# Patient Record
Sex: Male | Born: 1940
Health system: Southern US, Community
[De-identification: ages and names within clinical notes are randomized; demographics above are authoritative.]

## PROBLEM LIST (undated history)

## (undated) DIAGNOSIS — E785 Hyperlipidemia, unspecified: Secondary | ICD-10-CM

## (undated) DIAGNOSIS — Z87442 Personal history of urinary calculi: Secondary | ICD-10-CM

## (undated) DIAGNOSIS — N433 Hydrocele, unspecified: Secondary | ICD-10-CM

## (undated) DIAGNOSIS — J45909 Unspecified asthma, uncomplicated: Secondary | ICD-10-CM

## (undated) DIAGNOSIS — T7840XA Allergy, unspecified, initial encounter: Secondary | ICD-10-CM

## (undated) DIAGNOSIS — M1990 Unspecified osteoarthritis, unspecified site: Secondary | ICD-10-CM

## (undated) DIAGNOSIS — Z85828 Personal history of other malignant neoplasm of skin: Secondary | ICD-10-CM

## (undated) DIAGNOSIS — M5137 Other intervertebral disc degeneration, lumbosacral region: Secondary | ICD-10-CM

## (undated) DIAGNOSIS — Z86010 Personal history of colonic polyps: Secondary | ICD-10-CM

## (undated) DIAGNOSIS — H2101 Hyphema, right eye: Secondary | ICD-10-CM

## (undated) DIAGNOSIS — C4492 Squamous cell carcinoma of skin, unspecified: Secondary | ICD-10-CM

## (undated) DIAGNOSIS — G5760 Lesion of plantar nerve, unspecified lower limb: Secondary | ICD-10-CM

## (undated) DIAGNOSIS — H8110 Benign paroxysmal vertigo, unspecified ear: Secondary | ICD-10-CM

## (undated) DIAGNOSIS — K5730 Diverticulosis of large intestine without perforation or abscess without bleeding: Secondary | ICD-10-CM

## (undated) HISTORY — PX: BACK SURGERY: SHX140

## (undated) HISTORY — PX: SPINE SURGERY: SHX786

## (undated) HISTORY — DX: Allergy, unspecified, initial encounter: T78.40XA

## (undated) HISTORY — DX: Other intervertebral disc degeneration, lumbosacral region: M51.37

## (undated) HISTORY — PX: MOHS SURGERY: SUR867

## (undated) HISTORY — DX: Unspecified asthma, uncomplicated: J45.909

## (undated) HISTORY — DX: Hyperlipidemia, unspecified: E78.5

## (undated) HISTORY — DX: Personal history of colonic polyps: Z86.010

## (undated) HISTORY — DX: Benign paroxysmal vertigo, unspecified ear: H81.10

## (undated) HISTORY — PX: EYE SURGERY: SHX253

## (undated) HISTORY — DX: Hydrocele, unspecified: N43.3

## (undated) HISTORY — DX: Personal history of other malignant neoplasm of skin: Z85.828

## (undated) HISTORY — DX: Lesion of plantar nerve, unspecified lower limb: G57.60

## (undated) HISTORY — DX: Hyphema, right eye: H21.01

## (undated) HISTORY — DX: Unspecified osteoarthritis, unspecified site: M19.90

## (undated) HISTORY — DX: Diverticulosis of large intestine without perforation or abscess without bleeding: K57.30

## (undated) HISTORY — PX: COLONOSCOPY W/ BIOPSIES: SHX1374

---
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
## 2001-02-22 ENCOUNTER — Encounter: Admission: RE | Admit: 2001-02-22 | Discharge: 2001-03-25 | Payer: Self-pay | Admitting: Family Medicine

## 2002-01-12 ENCOUNTER — Emergency Department (HOSPITAL_COMMUNITY): Admission: EM | Admit: 2002-01-12 | Discharge: 2002-01-12 | Payer: Self-pay

## 2002-01-13 ENCOUNTER — Inpatient Hospital Stay (HOSPITAL_COMMUNITY): Admission: EM | Admit: 2002-01-13 | Discharge: 2002-01-16 | Payer: Self-pay | Admitting: Family Medicine

## 2002-01-13 ENCOUNTER — Encounter: Payer: Self-pay | Admitting: Family Medicine

## 2004-06-17 ENCOUNTER — Ambulatory Visit: Payer: Self-pay | Admitting: Family Medicine

## 2004-06-24 ENCOUNTER — Ambulatory Visit: Payer: Self-pay | Admitting: Family Medicine

## 2004-12-23 ENCOUNTER — Ambulatory Visit: Payer: Self-pay | Admitting: Family Medicine

## 2004-12-31 ENCOUNTER — Ambulatory Visit: Payer: Self-pay | Admitting: Family Medicine

## 2005-04-29 ENCOUNTER — Ambulatory Visit: Payer: Self-pay | Admitting: Family Medicine

## 2005-06-17 ENCOUNTER — Ambulatory Visit: Payer: Self-pay | Admitting: Family Medicine

## 2005-07-07 ENCOUNTER — Ambulatory Visit: Payer: Self-pay | Admitting: Family Medicine

## 2005-07-30 ENCOUNTER — Encounter: Admission: RE | Admit: 2005-07-30 | Discharge: 2005-07-30 | Payer: Self-pay | Admitting: Family Medicine

## 2006-07-24 ENCOUNTER — Ambulatory Visit: Payer: Self-pay | Admitting: Family Medicine

## 2006-07-24 LAB — CONVERTED CEMR LAB
ALT: 31 units/L (ref 0–40)
AST: 26 units/L (ref 0–37)
Albumin: 3.9 g/dL (ref 3.5–5.2)
Alkaline Phosphatase: 47 units/L (ref 39–117)
BUN: 18 mg/dL (ref 6–23)
Basophils Absolute: 0.1 10*3/uL (ref 0.0–0.1)
Basophils Relative: 0.9 % (ref 0.0–1.0)
CO2: 28 meq/L (ref 19–32)
Calcium: 9.3 mg/dL (ref 8.4–10.5)
Chloride: 105 meq/L (ref 96–112)
Chol/HDL Ratio, serum: 4
Cholesterol: 161 mg/dL (ref 0–200)
Creatinine, Ser: 1 mg/dL (ref 0.4–1.5)
Eosinophil percent: 1.9 % (ref 0.0–5.0)
GFR calc non Af Amer: 79 mL/min
Glomerular Filtration Rate, Af Am: 96 mL/min/{1.73_m2}
Glucose, Bld: 95 mg/dL (ref 70–99)
HCT: 42.7 % (ref 39.0–52.0)
HDL: 40.5 mg/dL (ref >39.0)
Hemoglobin: 14.9 g/dL (ref 13.0–17.0)
LDL Cholesterol: 102 mg/dL — ABNORMAL HIGH (ref 0–99)
Lymphocytes Relative: 33.6 % (ref 12.0–46.0)
MCHC: 34.9 g/dL (ref 30.0–36.0)
MCV: 94.2 fL (ref 78.0–100.0)
Monocytes Absolute: 0.4 10*3/uL (ref 0.2–0.7)
Monocytes Relative: 7.2 % (ref 3.0–11.0)
Neutro Abs: 3.5 10*3/uL (ref 1.4–7.7)
Neutrophils Relative %: 56.4 % (ref 43.0–77.0)
PSA: 3.51 ng/mL (ref 0.10–4.00)
Platelets: 192 10*3/uL (ref 150–400)
Potassium: 4.7 meq/L (ref 3.5–5.1)
RBC: 4.53 M/uL (ref 4.22–5.81)
RDW: 11.8 % (ref 11.5–14.6)
Sodium: 140 meq/L (ref 135–145)
TSH: 1.85 microintl units/mL (ref 0.35–5.50)
Total Bilirubin: 1 mg/dL (ref 0.3–1.2)
Total Protein: 7.1 g/dL (ref 6.0–8.3)
Triglyceride fasting, serum: 94 mg/dL (ref 0–149)
VLDL: 19 mg/dL (ref 0–40)
WBC: 6.1 10*3/uL (ref 4.5–10.5)

## 2006-08-06 ENCOUNTER — Ambulatory Visit: Payer: Self-pay | Admitting: Family Medicine

## 2007-03-12 DIAGNOSIS — E1169 Type 2 diabetes mellitus with other specified complication: Secondary | ICD-10-CM | POA: Insufficient documentation

## 2007-03-12 DIAGNOSIS — Z860100 Personal history of colon polyps, unspecified: Secondary | ICD-10-CM | POA: Insufficient documentation

## 2007-03-12 DIAGNOSIS — E785 Hyperlipidemia, unspecified: Secondary | ICD-10-CM

## 2007-03-12 HISTORY — DX: Personal history of colon polyps, unspecified: Z86.0100

## 2007-03-12 HISTORY — DX: Hyperlipidemia, unspecified: E78.5

## 2007-03-12 HISTORY — DX: Personal history of colonic polyps: Z86.010

## 2007-04-28 ENCOUNTER — Ambulatory Visit: Payer: Self-pay | Admitting: Family Medicine

## 2007-04-28 DIAGNOSIS — H8110 Benign paroxysmal vertigo, unspecified ear: Secondary | ICD-10-CM

## 2007-04-28 HISTORY — DX: Benign paroxysmal vertigo, unspecified ear: H81.10

## 2007-07-13 ENCOUNTER — Ambulatory Visit: Payer: Self-pay | Admitting: Internal Medicine

## 2007-07-13 LAB — CONVERTED CEMR LAB
ALT: 30 units/L (ref 0–53)
AST: 27 units/L (ref 0–37)
Albumin: 4.2 g/dL (ref 3.5–5.2)
Alkaline Phosphatase: 43 units/L (ref 39–117)
BUN: 11 mg/dL (ref 6–23)
Basophils Absolute: 0 10*3/uL (ref 0.0–0.1)
Basophils Relative: 0 % (ref 0.0–1.0)
Bilirubin, Direct: 0.2 mg/dL (ref 0.0–0.3)
CO2: 32 meq/L (ref 19–32)
Calcium: 9.4 mg/dL (ref 8.4–10.5)
Chloride: 101 meq/L (ref 96–112)
Cholesterol: 186 mg/dL (ref 0–200)
Creatinine, Ser: 1 mg/dL (ref 0.4–1.5)
Eosinophils Absolute: 0.1 10*3/uL (ref 0.0–0.6)
Eosinophils Relative: 1.7 % (ref 0.0–5.0)
GFR calc Af Amer: 96 mL/min
GFR calc non Af Amer: 79 mL/min
Glucose, Bld: 86 mg/dL (ref 70–99)
HCT: 43.8 % (ref 39.0–52.0)
HDL: 41.2 mg/dL (ref >39.0)
Hemoglobin: 15.1 g/dL (ref 13.0–17.0)
LDL Cholesterol: 113 mg/dL — ABNORMAL HIGH (ref 0–99)
Lymphocytes Relative: 32.2 % (ref 12.0–46.0)
MCHC: 34.4 g/dL (ref 30.0–36.0)
MCV: 94.6 fL (ref 78.0–100.0)
Monocytes Absolute: 0.6 10*3/uL (ref 0.2–0.7)
Monocytes Relative: 7.3 % (ref 3.0–11.0)
Neutro Abs: 4.5 10*3/uL (ref 1.4–7.7)
Neutrophils Relative %: 58.8 % (ref 43.0–77.0)
PSA: 3.27 ng/mL (ref 0.10–4.00)
Platelets: 199 10*3/uL (ref 150–400)
Potassium: 4.6 meq/L (ref 3.5–5.1)
RBC: 4.63 M/uL (ref 4.22–5.81)
RDW: 12 % (ref 11.5–14.6)
Sodium: 141 meq/L (ref 135–145)
TSH: 2.22 microintl units/mL (ref 0.35–5.50)
Total Bilirubin: 1.1 mg/dL (ref 0.3–1.2)
Total CHOL/HDL Ratio: 4.5
Total Protein: 6.8 g/dL (ref 6.0–8.3)
Triglycerides: 157 mg/dL — ABNORMAL HIGH (ref 0–149)
VLDL: 31 mg/dL (ref 0–40)
WBC: 7.7 10*3/uL (ref 4.5–10.5)

## 2007-07-19 ENCOUNTER — Ambulatory Visit: Payer: Self-pay | Admitting: Family Medicine

## 2008-03-13 ENCOUNTER — Ambulatory Visit: Payer: Self-pay | Admitting: Family Medicine

## 2008-03-13 DIAGNOSIS — G5760 Lesion of plantar nerve, unspecified lower limb: Secondary | ICD-10-CM | POA: Insufficient documentation

## 2008-03-13 DIAGNOSIS — G5763 Lesion of plantar nerve, bilateral lower limbs: Secondary | ICD-10-CM | POA: Insufficient documentation

## 2008-03-13 HISTORY — DX: Lesion of plantar nerve, unspecified lower limb: G57.60

## 2008-04-11 ENCOUNTER — Telehealth: Payer: Self-pay | Admitting: Family Medicine

## 2008-09-18 ENCOUNTER — Ambulatory Visit: Payer: Self-pay | Admitting: Family Medicine

## 2008-10-03 ENCOUNTER — Ambulatory Visit: Payer: Self-pay | Admitting: Family Medicine

## 2008-10-04 DIAGNOSIS — H6120 Impacted cerumen, unspecified ear: Secondary | ICD-10-CM | POA: Insufficient documentation

## 2008-12-03 DIAGNOSIS — M5137 Other intervertebral disc degeneration, lumbosacral region: Secondary | ICD-10-CM | POA: Insufficient documentation

## 2008-12-03 DIAGNOSIS — M51379 Other intervertebral disc degeneration, lumbosacral region without mention of lumbar back pain or lower extremity pain: Secondary | ICD-10-CM

## 2008-12-03 HISTORY — DX: Other intervertebral disc degeneration, lumbosacral region without mention of lumbar back pain or lower extremity pain: M51.379

## 2008-12-03 HISTORY — DX: Other intervertebral disc degeneration, lumbosacral region: M51.37

## 2008-12-19 ENCOUNTER — Ambulatory Visit: Payer: Self-pay | Admitting: Family Medicine

## 2008-12-26 ENCOUNTER — Encounter: Admission: RE | Admit: 2008-12-26 | Discharge: 2009-02-06 | Payer: Self-pay | Admitting: Family Medicine

## 2008-12-26 ENCOUNTER — Encounter: Payer: Self-pay | Admitting: Family Medicine

## 2009-01-15 ENCOUNTER — Ambulatory Visit: Payer: Self-pay | Admitting: Family Medicine

## 2009-03-20 ENCOUNTER — Ambulatory Visit: Payer: Self-pay | Admitting: Internal Medicine

## 2009-03-31 LAB — HM COLONOSCOPY

## 2009-04-03 ENCOUNTER — Encounter: Payer: Self-pay | Admitting: Internal Medicine

## 2009-04-03 ENCOUNTER — Ambulatory Visit: Payer: Self-pay | Admitting: Internal Medicine

## 2009-04-06 ENCOUNTER — Encounter: Payer: Self-pay | Admitting: Internal Medicine

## 2009-10-17 ENCOUNTER — Ambulatory Visit: Payer: Self-pay | Admitting: Family Medicine

## 2009-10-17 DIAGNOSIS — K5730 Diverticulosis of large intestine without perforation or abscess without bleeding: Secondary | ICD-10-CM

## 2009-10-17 DIAGNOSIS — Z86006 Personal history of melanoma in-situ: Secondary | ICD-10-CM | POA: Insufficient documentation

## 2009-10-17 DIAGNOSIS — Z85828 Personal history of other malignant neoplasm of skin: Secondary | ICD-10-CM

## 2009-10-17 HISTORY — DX: Diverticulosis of large intestine without perforation or abscess without bleeding: K57.30

## 2009-10-17 HISTORY — DX: Personal history of other malignant neoplasm of skin: Z85.828

## 2010-09-01 LAB — CONVERTED CEMR LAB
ALT: 31 units/L (ref 0–53)
ALT: 35 units/L (ref 0–53)
AST: 31 units/L (ref 0–37)
AST: 31 units/L (ref 0–37)
Albumin: 4.2 g/dL (ref 3.5–5.2)
Albumin: 4.4 g/dL (ref 3.5–5.2)
Alkaline Phosphatase: 41 units/L (ref 39–117)
Alkaline Phosphatase: 42 units/L (ref 39–117)
BUN: 14 mg/dL (ref 6–23)
BUN: 17 mg/dL (ref 6–23)
Basophils Absolute: 0 10*3/uL (ref 0.0–0.1)
Basophils Absolute: 0 10*3/uL (ref 0.0–0.1)
Basophils Relative: 0.5 % (ref 0.0–3.0)
Basophils Relative: 0.6 % (ref 0.0–3.0)
Bilirubin Urine: NEGATIVE
Bilirubin, Direct: 0.1 mg/dL (ref 0.0–0.3)
Bilirubin, Direct: 0.2 mg/dL (ref 0.0–0.3)
Blood in Urine, dipstick: NEGATIVE
CO2: 29 meq/L (ref 19–32)
CO2: 31 meq/L (ref 19–32)
Calcium: 9 mg/dL (ref 8.4–10.5)
Calcium: 9.6 mg/dL (ref 8.4–10.5)
Chloride: 105 meq/L (ref 96–112)
Chloride: 109 meq/L (ref 96–112)
Cholesterol: 186 mg/dL (ref 0–200)
Cholesterol: 209 mg/dL (ref 0–200)
Creatinine, Ser: 0.9 mg/dL (ref 0.4–1.5)
Creatinine, Ser: 1 mg/dL (ref 0.4–1.5)
Direct LDL: 132.4 mg/dL
Eosinophils Absolute: 0.1 10*3/uL (ref 0.0–0.7)
Eosinophils Absolute: 0.1 10*3/uL (ref 0.0–0.7)
Eosinophils Relative: 1.4 % (ref 0.0–5.0)
Eosinophils Relative: 1.8 % (ref 0.0–5.0)
GFR calc Af Amer: 108 mL/min
GFR calc non Af Amer: 89 mL/min
GFR calc non Af Amer: 95.06 mL/min (ref >60)
Glucose, Bld: 103 mg/dL — ABNORMAL HIGH (ref 70–99)
Glucose, Bld: 94 mg/dL (ref 70–99)
Glucose, Urine, Semiquant: NEGATIVE
HCT: 44.2 % (ref 39.0–52.0)
HCT: 46.7 % (ref 39.0–52.0)
HDL: 42.4 mg/dL (ref >39.0)
HDL: 51.4 mg/dL (ref >39.00)
Hemoglobin: 14.8 g/dL (ref 13.0–17.0)
Hemoglobin: 16.3 g/dL (ref 13.0–17.0)
Ketones, urine, test strip: NEGATIVE
LDL Cholesterol: 109 mg/dL — ABNORMAL HIGH (ref 0–99)
Lymphocytes Relative: 34.4 % (ref 12.0–46.0)
Lymphocytes Relative: 38.5 % (ref 12.0–46.0)
Lymphs Abs: 2.5 10*3/uL (ref 0.7–4.0)
MCHC: 33.4 g/dL (ref 30.0–36.0)
MCHC: 34.8 g/dL (ref 30.0–36.0)
MCV: 92.1 fL (ref 78.0–100.0)
MCV: 94.6 fL (ref 78.0–100.0)
Monocytes Absolute: 0.5 10*3/uL (ref 0.1–1.0)
Monocytes Absolute: 0.5 10*3/uL (ref 0.1–1.0)
Monocytes Relative: 6.2 % (ref 3.0–12.0)
Monocytes Relative: 7.6 % (ref 3.0–12.0)
Neutro Abs: 3.3 10*3/uL (ref 1.4–7.7)
Neutro Abs: 4.3 10*3/uL (ref 1.4–7.7)
Neutrophils Relative %: 51.6 % (ref 43.0–77.0)
Neutrophils Relative %: 57.4 % (ref 43.0–77.0)
Nitrite: NEGATIVE
PSA: 3.04 ng/mL (ref 0.10–4.00)
PSA: 3.71 ng/mL (ref 0.10–4.00)
Platelets: 183 10*3/uL (ref 150–400)
Platelets: 189 10*3/uL (ref 150.0–400.0)
Potassium: 4.4 meq/L (ref 3.5–5.1)
Potassium: 4.8 meq/L (ref 3.5–5.1)
Protein, U semiquant: NEGATIVE
RBC: 4.67 M/uL (ref 4.22–5.81)
RBC: 5.07 M/uL (ref 4.22–5.81)
RDW: 11.8 % (ref 11.5–14.6)
RDW: 12 % (ref 11.5–14.6)
Sodium: 142 meq/L (ref 135–145)
Sodium: 143 meq/L (ref 135–145)
Specific Gravity, Urine: <1.005
TSH: 2.04 microintl units/mL (ref 0.35–5.50)
TSH: 2.1 microintl units/mL (ref 0.35–5.50)
Total Bilirubin: 0.7 mg/dL (ref 0.3–1.2)
Total Bilirubin: 1.2 mg/dL (ref 0.3–1.2)
Total CHOL/HDL Ratio: 4
Total CHOL/HDL Ratio: 4.9
Total Protein: 7.5 g/dL (ref 6.0–8.3)
Total Protein: 7.5 g/dL (ref 6.0–8.3)
Triglycerides: 127 mg/dL (ref 0.0–149.0)
Triglycerides: 186 mg/dL — ABNORMAL HIGH (ref 0–149)
Urobilinogen, UA: 0.2
VLDL: 25.4 mg/dL (ref 0.0–40.0)
VLDL: 37 mg/dL (ref 0–40)
WBC Urine, dipstick: NEGATIVE
WBC: 6.4 10*3/uL (ref 4.5–10.5)
WBC: 7.4 10*3/uL (ref 4.5–10.5)
pH: 6

## 2010-09-05 NOTE — Assessment & Plan Note (Signed)
Summary: pt will come in fasting/njr   Vital Signs:  Patient profile:   70 year old male Height:      70.25 inches Weight:      226 pounds BMI:     32.31 Temp:     98.6 degrees F oral BP sitting:   120 / 78  (left arm) Cuff size:   regular  Vitals Entered By: Kern Reap CMA Duncan Dull) (October 17, 2009 10:01 AM)  Reason for Visit cpx  History of Present Illness: Daniel Hampton is a 70 year old, married male, retired, nonsmoker, who comes in today for evaluation of hyperlipidemia.  History history of hyperlipidemia and is on Zocor 80 mg nightly and one aspirin tablet.  Will check lipid panel today.  His past medical history, social history and family history were reviewed in detail detailed.  No risk factors that are new.  He continues to stay physically active, playing golf.  No history of depression.  In vision, hearing normal height and weight unchanged.  Activities of daily living were reviewed normal.  No risk for falls.  Home safety reviewed negative.  We will get labs to evaluate hyperlipidemia.  A new problem is a lesion on his left ear.  He had Mohs surgery to his nose years ago for cancer on his nose.  He has a lesion in his left ear.  This been there for a couple months and will not heal.  History change in appear colonoscopy done, and GI normal except for diverticulosis.  He was advised to take a stool softener on a daily basis.  Tetanus 2008, Pneumovax 2006, seasonal flu 2010.  Allergies (verified): No Known Drug Allergies  Past History:  Past medical, surgical, family and social histories (including risk factors) reviewed, and no changes noted (except as noted below).  Past Medical History: Hyperlipidemia Colonic polyps, hx of lumbar disk surgery Diverticulosis, colon Skin cancer, hx of  Past Surgical History: Reviewed history from 03/12/2007 and no changes required. Lumbar Disc Sx Colonoscopy-09/12/2003  Family History: Reviewed history from 03/12/2007 and no changes  required. Family History of Alcoholism/Addiction Family History Lung cancer Fam hx COPD  Social History: Reviewed history from 07/19/2007 and no changes required. Former Smoker Alcohol use-yes Drug use-no Retired  Sales promotion account executive  Review of Systems      See HPI  Physical Exam  General:  Well-developed,well-nourished,in no acute distress; alert,appropriate and cooperative throughout examination Head:  Normocephalic and atraumatic without obvious abnormalities. No apparent alopecia or balding. Eyes:  No corneal or conjunctival inflammation noted. EOMI. Perrla. Funduscopic exam benign, without hemorrhages, exudates or papilledema. Vision grossly normal. Ears:  External ear exam shows no significant lesions or deformities.  Otoscopic examination reveals clear canals, tympanic membranes are intact bilaterally without bulging, retraction, inflammation or discharge. Hearing is grossly normal bilaterally. Nose:  External nasal examination shows no deformity or inflammation. Nasal mucosa are pink and moist without lesions or exudates. Mouth:  Oral mucosa and oropharynx without lesions or exudates.  Teeth in good repair. Neck:  No deformities, masses, or tenderness noted. Chest Wall:  No deformities, masses, tenderness or gynecomastia noted. Breasts:  No masses or gynecomastia noted Lungs:  Normal respiratory effort, chest expands symmetrically. Lungs are clear to auscultation, no crackles or wheezes. Heart:  Normal rate and regular rhythm. S1 and S2 normal without gallop, murmur, click, rub or other extra sounds. Abdomen:  Bowel sounds positive,abdomen soft and non-tender without masses, organomegaly or hernias noted. Rectal:  No external abnormalities noted. Normal sphincter tone. No rectal  masses or tenderness. Genitalia:  genitalia normal, except for a hydrocele on the right, unchanged over years Prostate:  Prostate gland firm and smooth, no enlargement, nodularity, tenderness, mass, asymmetry or  induration. Msk:  No deformity or scoliosis noted of thoracic or lumbar spine.   Pulses:  R and L carotid,radial,femoral,dorsalis pedis and posterior tibial pulses are full and equal bilaterally Extremities:  No clubbing, cyanosis, edema, or deformity noted with normal full range of motion of all joints.   Neurologic:  No cranial nerve deficits noted. Station and gait are normal. Plantar reflexes are down-going bilaterally. DTRs are symmetrical throughout. Sensory, motor and coordinative functions appear intact. Skin:  total body skin exam normal except for some scarring and nose from previous surgery and a red, irritated lesion on his left ear.......... advised to see the dermatologist ASAP Cervical Nodes:  No lymphadenopathy noted Axillary Nodes:  No palpable lymphadenopathy Inguinal Nodes:  No significant adenopathy Psych:  Cognition and judgment appear intact. Alert and cooperative with normal attention span and concentration. No apparent delusions, illusions, hallucinations   Impression & Recommendations:  Problem # 1:  DIVERTICULOSIS, COLON (ICD-562.10) Assessment Unchanged  Orders: Venipuncture (04540) TLB-Lipid Panel (80061-LIPID) TLB-BMP (Basic Metabolic Panel-BMET) (80048-METABOL) TLB-CBC Platelet - w/Differential (85025-CBCD) TLB-Hepatic/Liver Function Pnl (80076-HEPATIC) TLB-TSH (Thyroid Stimulating Hormone) (84443-TSH) TLB-PSA (Prostate Specific Antigen) (84153-PSA) Subsequent annual wellness visit with prevention plan (J8119)  Problem # 2:  SKIN CANCER, HX OF (ICD-V10.83) Assessment: Unchanged  Orders: Venipuncture (14782) TLB-Lipid Panel (80061-LIPID) TLB-BMP (Basic Metabolic Panel-BMET) (80048-METABOL) TLB-CBC Platelet - w/Differential (85025-CBCD) TLB-Hepatic/Liver Function Pnl (80076-HEPATIC) TLB-TSH (Thyroid Stimulating Hormone) (84443-TSH) TLB-PSA (Prostate Specific Antigen) (84153-PSA) Subsequent annual wellness visit with prevention plan (N5621) UA  Dipstick w/o Micro (automated)  (81003)  Problem # 3:  HYPERLIPIDEMIA (ICD-272.4) Assessment: Improved  His updated medication list for this problem includes:    Zocor 80 Mg Tabs (Simvastatin) .Marland Kitchen... Take 1 tablet by mouth at bedtime  Orders: Venipuncture (30865) TLB-Lipid Panel (80061-LIPID) TLB-BMP (Basic Metabolic Panel-BMET) (80048-METABOL) TLB-CBC Platelet - w/Differential (85025-CBCD) TLB-Hepatic/Liver Function Pnl (80076-HEPATIC) TLB-TSH (Thyroid Stimulating Hormone) (84443-TSH) TLB-PSA (Prostate Specific Antigen) (84153-PSA) Subsequent annual wellness visit with prevention plan (H8469) EKG w/ Interpretation (93000)  Problem # 4:  PHYSICAL EXAMINATION (ICD-V70.0) Assessment: Unchanged  Orders: Venipuncture (62952) TLB-Lipid Panel (80061-LIPID) TLB-BMP (Basic Metabolic Panel-BMET) (80048-METABOL) TLB-CBC Platelet - w/Differential (85025-CBCD) TLB-Hepatic/Liver Function Pnl (80076-HEPATIC) TLB-TSH (Thyroid Stimulating Hormone) (84443-TSH) TLB-PSA (Prostate Specific Antigen) (84153-PSA) Subsequent annual wellness visit with prevention plan (W4132)  Complete Medication List: 1)  Zocor 80 Mg Tabs (Simvastatin) .... Take 1 tablet by mouth at bedtime 2)  Asa 81 Mg  3)  Daily Multi Tabs (Multiple vitamins-minerals) .... Take one tab once daily  Patient Instructions: 1)  It is important that you exercise regularly at least 20 minutes 5 times a week. If you develop chest pain, have severe difficulty breathing, or feel very tired , stop exercising immediately and seek medical attention. 2)  Schedule a colonoscopy/sigmoidoscopy to help detect colon cancer. 3)  Take an Aspirin every day. 4)  take a stool softener on a daily basis. 5)  Go to the dermatology office and set up A. appointment for evaluation of the lesion.  On your left ear Prescriptions: ZOCOR 80 MG  TABS (SIMVASTATIN) Take 1 tablet by mouth at bedtime  #100 x 4   Entered and Authorized by:   Roderick Pee MD    Signed by:   Roderick Pee MD on 10/17/2009   Method used:   Electronically to  [facility] [phone]* (retail)       [address] [place], [place]  [address]       Ph: [phone]       Fax: [phone]   RxID:   [id number]

## 2010-10-30 ENCOUNTER — Encounter: Payer: Self-pay | Admitting: Family Medicine

## 2010-10-30 ENCOUNTER — Ambulatory Visit (INDEPENDENT_AMBULATORY_CARE_PROVIDER_SITE_OTHER): Payer: Medicare HMO | Admitting: Family Medicine

## 2010-10-30 VITALS — BP 110/70 | Temp 97.7°F | Ht 70.5 in | Wt 227.0 lb

## 2010-10-30 DIAGNOSIS — J209 Acute bronchitis, unspecified: Secondary | ICD-10-CM

## 2010-10-30 MED ORDER — HYDROCODONE-HOMATROPINE 5-1.5 MG/5ML PO SYRP
5.0000 mL | ORAL_SOLUTION | Freq: Four times a day (QID) | ORAL | Status: AC | PRN
Start: 2010-10-30 — End: 2010-11-09

## 2010-10-30 NOTE — Patient Instructions (Signed)
Acute Bronchitis You have acute bronchitis. This means you have a chest cold. The airways in your lungs are inflamed (red and sore). Acute means it is sudden onset. Bronchitis is most often caused by a virus. In smokers, people with chronic lung problems, and elderly patients, treatment with antibiotics for bacterial infection may be needed. Exposure to cigarette smoke or irritating chemicals will make bronchitis worse. Allergies and asthma can also make bronchitis worse. Repeated episodes of bronchitis may cause long standing lung problems. Acute bronchitis is usually treated with rest, fluids, and medicines for relief of fever or cough. Bronchodilator medicines from metered inhalers or a nebulizer may be used to help open up the small airways. This reduces shortness of breath and helps control cough. Antibiotics can be prescribed if you are more seriously ill or at risk. A cool air vaporizer may help thin bronchial secretions and make it easier to clear your chest. Increased fluids may also help. You must avoid smoking, even second hand exposure. If you are a cigarette smoker, consider using nicotine gum or skin patches to help control withdrawal symptoms. Recovery from bronchitis is often slow, but you should start feeling better after 2-3 days. Cough from bronchitis frequently lasts for 3-4 weeks.  SEEK IMMEDIATE MEDICAL CARE IF YOU DEVELOP:  Increased fever, chills, or chest pain.   Severe shortness of breath or bloody sputum.   Dehydration, fainting, repeated vomiting, severe headache.   No improvement after one week of proper treatment.  MAKE SURE YOU:   Understand these instructions.   Will watch your condition.   Will get help right away if you are not doing well or get worse.  Document Released: 08/28/2004 Document Re-Released: 07/03/2008 [employer] Patient Information 2011 [employer]. 

## 2010-10-30 NOTE — Progress Notes (Signed)
  Subjective:    Patient ID: Daniel Hampton, male    DOB: [birth year], 70 y.o.   MRN: [id number]  HPI Patient seen with almost 2 week history of dry cough. Symptoms worse at night. Denies nasal congestion. No fever, chills, or dyspnea. No GERD symptoms. No history of asthma and no wheezing. Tried over-the-counter cough medication without much success. Denies nausea, vomiting, or diarrhea. No appetite or weight changes. No pleuritic pain. No hemoptysis. Quit smoking several years ago   Review of Systems  Constitutional: Negative for fever, chills, activity change, appetite change, fatigue and unexpected weight change.  HENT: Negative for ear pain, congestion, sore throat and trouble swallowing.   Respiratory: Positive for cough. Negative for shortness of breath, wheezing and stridor.   Cardiovascular: Negative for chest pain and leg swelling.  Gastrointestinal: Negative for abdominal pain.  Musculoskeletal: Negative for arthralgias.  Skin: Negative for rash.  Neurological: Negative for syncope and headaches.  Hematological: Negative for adenopathy.       Objective:   Physical Exam  Constitutional: He appears well-developed and well-nourished.  HENT:  Head: Normocephalic and atraumatic.  Right Ear: External ear normal.  Left Ear: External ear normal.  Mouth/Throat: No oropharyngeal exudate.  Neck: Normal range of motion. Neck supple.  Cardiovascular: Normal rate and regular rhythm.   No murmur heard. Pulmonary/Chest: Effort normal and breath sounds normal. No respiratory distress. He has no wheezes. He has no rales.  Musculoskeletal: He exhibits no edema.  Lymphadenopathy:    He has no cervical adenopathy.          Assessment & Plan:  Acute bronchitis. Suspect viral origin. Hycodan for cough suppression. Followup when necessary for any fever worsening symptoms

## 2010-11-26 ENCOUNTER — Ambulatory Visit (INDEPENDENT_AMBULATORY_CARE_PROVIDER_SITE_OTHER): Payer: Medicare HMO | Admitting: Family Medicine

## 2010-11-26 ENCOUNTER — Encounter: Payer: Self-pay | Admitting: Family Medicine

## 2010-11-26 VITALS — BP 110/78 | Temp 98.1°F | Wt 225.0 lb

## 2010-11-26 DIAGNOSIS — J45901 Unspecified asthma with (acute) exacerbation: Secondary | ICD-10-CM

## 2010-11-26 MED ORDER — PREDNISONE 20 MG PO TABS: ORAL_TABLET | ORAL | Status: DC

## 2010-11-26 NOTE — Progress Notes (Signed)
  Subjective:    Patient ID: Daniel Hampton, male    DOB: 10/22/1940, 70 y.o.   MRN: [id number]  HPISam is a 70 year old male, nonsmoker, who comes in today for evaluation of a cough x 6 weeks.  He had a viral infection about 6 weeks ago that lasted for about a week to 10 days and went away, but the cough has persisted.  He said a history of allergic rhinitis in the past.      Review of Systems    General and pulmonary review of systems otherwise negative.  Specifically, no fever, chills, sputum production, weight loss, etc. Objective:   Physical Exam     Well-developed well-nourished man in no acute distress.  HEENT negative.  Neck was supple.  No adenopathy.  Lungs are clear   Assessment & Plan:  Reactive airway disease,,,,,,,,,,, prednisone burst and taper return p.r.n.

## 2010-11-26 NOTE — Patient Instructions (Signed)
Take the prednisone as directed.  Avoid salt.  Return p.r.n.

## 2010-12-18 ENCOUNTER — Ambulatory Visit: Payer: Self-pay | Admitting: Family Medicine

## 2010-12-20 NOTE — Discharge Summary (Signed)
[facility]  Patient:    Daniel Hampton, Daniel Hampton Visit Number: [id number] MRN: [id number]          Service Type: MED Location: [id number] Attending Physician:  Evette Georges Dictated by:   Gordy Savers, M.D. [facility] Admit Date:  01/13/2002 Discharge Date: 01/16/2002   CC:         Evette Georges, M.D. [facility]   Discharge Summary  FINAL DIAGNOSES: 1. Acute prostatitis. 2. Acute urinary retention. 3. Hypercholesterolemia.  HISTORY OF PRESENT ILLNESS:  The patient is a 70 year old white gentleman who presented to the office with a five-day history of fevers, chills, and myalgias.  He finally presented to the office complaining of severe abdominal pain, inability to void, and clinical findings consistent with acute urinary retention.  HOSPITAL COURSE:  The patient was admitted to the hospital, Foley catheter placed with slow decompression of a markedly distended bladder in excess of 2 L.  The patient was initially treated with IV Cipro and later p.o. Cipro. After approximately 48 hours he was given a voiding trial and his Foley catheter discontinued.  He required a single in-and-out catheterization but, by the time of discharge, had been voiding reasonably well.  Subsequent urine culture revealed 2000 colony per cc count of enterococcus.  Laboratory studies were otherwise fairly unremarkable.  Initial hyponatremia of 129 normalized to 143.  Electrocardiogram was normal.  Other chemistries were unremarkable.  MEDICATIONS:  The patient was discharged to complete two additional weeks of Cipro 500 mg b.i.d.  He will resume his preadmission medications of aspirin and Lipitor.  FOLLOW-UP:  He was asked to follow up with his primary care physician within the next week.  CONDITION ON DISCHARGE:  Improved. Dictated by:   Gordy Savers, M.D. [facility] Attending Physician:  Evette Georges DD:  01/16/02 TD:  01/18/02 Job: [id number]

## 2010-12-20 NOTE — H&P (Signed)
[facility]  Patient:    Daniel Hampton, Daniel Hampton Visit Number: [id number] MRN: [id number]          Service Type: MED Location: [id number] Attending Physician:  Evette Georges Dictated by:   Evette Georges, M.D. [facility] Admit Date:  01/13/2002 Discharge Date: 01/16/2002                           History and Physical  DATE OF BIRTH:  1940/10/20  HISTORY OF PRESENT ILLNESS:  This is the first Daniel Hampton admission for this 70 year old married male who comes into the office and subsequently was admitted for acute urinary retention.  The patient was seen here in the office on January 10, 2002.  At that time he complained of fever, muscle aches, mild headache.  He had no nausea, vomiting, back pain, abdominal pain, etc.  Symptoms have been present for less than 24 hours.  Review of systems was entirely negative.  Physical exam at that time was negative except for a temperature of 101.  Without any other symptomatology, it was felt like he most likely had a viral infection.  He was treated symptomatically.  The next day he developed urinary tract symptoms with burning.  He went to the emergency room Tuesday night and was seen and told he had a prostatitis.  He was started on Septra, Pyridium, and was given Vicodin for pain.  He comes in today complaining of the inability to button his pants.  He has gained 5 pounds in the past four days.  He is unable to empty his bladder and he has got abdominal distention.  PAST MEDICAL HISTORY: 1. Hospitalized for lumbar disk L3. 2. Colon polyps. 3. He had a benign lump removed.  OUTPATIENT SURGERY:  None.  ILLNESSES:  None.  INJURIES:  None.  DRUG ALLERGIES:  None.  HABITS:  He does not smoke or drink any alcohol, except for an occasional drink.  MEDICATION HISTORY: 1. Lipitor 10 q.h.s. 2. One aspirin a day.  REVIEW OF SYSTEMS:  HEENT:  Negative except he wears glasses.  He does get regular dental care.   CARDIOPULMONARY:  Negative.  GASTROINTESTINAL: Negative.  GENITOURINARY:  Negative.  MUSCULOSKELETAL:  Pertinent.  He has had the disk problem as noted in the past.  He does have allergic rhinitis.  He takes over-the-counter medicines for that.  The rest of the review of systems is negative.  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  He enjoys fishing and golf.  He walks three times a week.  He is married with three daughters.  FAMILY HISTORY:  Dad died at 39 of COPD, smoker.  Mother died at 29 of lung cancer.  Two brothers, one is in good health and a smoker, one who died of lung cancer from smoking and alcoholism.  Two sisters in good health.  VACCINATIONS:  Shows tetanus, 1997.  PHYSICAL EVALUATION:  VITAL SIGNS:  Temperature was 98, pulse was 80 and regular, respirations 12 and regular, weight was 226, which represents a five pound weight gain in four days.  Blood pressure was 120/80.  GENERAL:  In general he is a well-developed, well-nourished male who is in acute pain.  HEENT:  Negative.  NECK:  Supple.  Thyroid is not enlarged.  CHEST:  Clear to auscultation.  CARDIAC:  Negative.  ABDOMEN:  The abdomen was markedly distended.  The bladder was felt below the xiphoid.  EXTREMITIES:  Normal.  RECTAL:  Not repeated.  It was done the other night and showed BPH.  LABORATORY:  Urinalysis shows white cells, bacteria, and a few red cells.  IMPRESSION/PLAN: 1. Acute prostatitis with secondary urinary retention.  Plan:  Admit.  IV    fluids.  IV antibiotics.  Foley catheter to decompress his bladder slowly    over the next 6-7 hours.  Would allow out 500 cc about every hour.  Will    see him in follow-up and decide what to do from there. 2. History of hyperlipidemia, currently on Lipitor.  Continue that medication    and one aspirin a day. Dictated by:   Evette Georges, M.D. [facility] Attending Physician:  Evette Georges DD:  01/13/02 TD:  01/15/02 Job:  [id number]

## 2010-12-23 ENCOUNTER — Other Ambulatory Visit: Payer: Self-pay | Admitting: Dermatology

## 2010-12-31 ENCOUNTER — Ambulatory Visit (INDEPENDENT_AMBULATORY_CARE_PROVIDER_SITE_OTHER): Payer: Medicare HMO | Admitting: Family Medicine

## 2010-12-31 ENCOUNTER — Encounter: Payer: Self-pay | Admitting: Family Medicine

## 2010-12-31 DIAGNOSIS — Z136 Encounter for screening for cardiovascular disorders: Secondary | ICD-10-CM

## 2010-12-31 DIAGNOSIS — Z125 Encounter for screening for malignant neoplasm of prostate: Secondary | ICD-10-CM

## 2010-12-31 DIAGNOSIS — Z23 Encounter for immunization: Secondary | ICD-10-CM

## 2010-12-31 DIAGNOSIS — M5137 Other intervertebral disc degeneration, lumbosacral region: Secondary | ICD-10-CM

## 2010-12-31 DIAGNOSIS — E785 Hyperlipidemia, unspecified: Secondary | ICD-10-CM

## 2010-12-31 LAB — LIPID PANEL
HDL: 56.9 mg/dL (ref >39.00)
Total CHOL/HDL Ratio: 3
Triglycerides: 89 mg/dL (ref 0.0–149.0)
VLDL: 17.8 mg/dL (ref 0.0–40.0)

## 2010-12-31 LAB — POCT URINALYSIS DIPSTICK
Blood, UA: NEGATIVE
Glucose, UA: NEGATIVE
Ketones, UA: NEGATIVE
Spec Grav, UA: 1.02

## 2010-12-31 LAB — CBC WITH DIFFERENTIAL/PLATELET
Basophils Absolute: 0 10*3/uL (ref 0.0–0.1)
Basophils Relative: 0.5 % (ref 0.0–3.0)
Eosinophils Absolute: 0.1 10*3/uL (ref 0.0–0.7)
MCHC: 34.7 g/dL (ref 30.0–36.0)
MCV: 94.6 fl (ref 78.0–100.0)
Monocytes Absolute: 0.5 10*3/uL (ref 0.1–1.0)
Neutrophils Relative %: 47.8 % (ref 43.0–77.0)
RBC: 4.43 Mil/uL (ref 4.22–5.81)
RDW: 13 % (ref 11.5–14.6)

## 2010-12-31 LAB — HEPATIC FUNCTION PANEL
Albumin: 4.1 g/dL (ref 3.5–5.2)
Bilirubin, Direct: 0.1 mg/dL (ref 0.0–0.3)
Total Protein: 6.7 g/dL (ref 6.0–8.3)

## 2010-12-31 MED ORDER — SIMVASTATIN 40 MG PO TABS: 40.0000 mg | ORAL_TABLET | Freq: Every evening | ORAL | Status: DC

## 2010-12-31 NOTE — Progress Notes (Signed)
  Subjective:    Patient ID: Daniel Hampton, male    DOB: [birth year], 70 y.o.   MRN: [id number]  HPI  [name] is a delightful, 70 year old male, who comes in today for Medicare wellness exam.  Because of a history of underlying hyperlipidemia.  His hyperlipidemia.  History was core 40 mg nightly and an aspirin tablet.  Will check lipid panel today.  Since March.  She is having numbness and pain from his hip to his knee posteriorly.  It comes and goes.  It had the same problem on the other side.  A couple years ago.  It resolved spontaneously with physical therapy and Motrin.  He gets routine eye care, dental care, colonoscopy last year.  Normal except for some diverticula, Pneumovax given today, shingles.  Shingles, vaccine,,,,,,,,, information given, tetanus, 2008, he walks on a daily basis.  Home safety reviewed.  There are no issues.  There are no guns in the house.  He has a living will and health care power-of-attorney.    Review of Systems  Constitutional: Negative.   HENT: Negative.   Eyes: Negative.   Respiratory: Negative.   Cardiovascular: Negative.   Gastrointestinal: Negative.   Genitourinary: Negative.   Musculoskeletal: Negative.   Skin: Negative.   Neurological: Negative.   Hematological: Negative.   Psychiatric/Behavioral: Negative.        Objective:   Physical Exam  Constitutional: He is oriented to person, place, and time. He appears well-developed and well-nourished.  HENT:  Head: Normocephalic and atraumatic.  Right Ear: External ear normal.  Left Ear: External ear normal.  Nose: Nose normal.  Mouth/Throat: Oropharynx is clear and moist.  Eyes: Conjunctivae and EOM are normal. Pupils are equal, round, and reactive to light.  Neck: Normal range of motion. Neck supple. No JVD present. No tracheal deviation present. No thyromegaly present.  Cardiovascular: Normal rate, regular rhythm, normal heart sounds and intact distal pulses.  Exam reveals no gallop and no  friction rub.   No murmur heard. Pulmonary/Chest: Effort normal and breath sounds normal. No stridor. No respiratory distress. He has no wheezes. He has no rales. He exhibits no tenderness.  Abdominal: Soft. Bowel sounds are normal. He exhibits no distension and no mass. There is no tenderness. There is no rebound and no guarding.  Genitourinary: Rectum normal, prostate normal and penis normal. Guaiac negative stool. No penile tenderness.  Musculoskeletal: Normal range of motion. He exhibits no edema and no tenderness.  Lymphadenopathy:    He has no cervical adenopathy.  Neurological: He is alert and oriented to person, place, and time. He has normal reflexes. No cranial nerve deficit. He exhibits normal muscle tone.  Skin: Skin is warm and dry. No rash noted. No erythema. No pallor.  Psychiatric: He has a normal mood and affect. His behavior is normal. Judgment and thought content normal.          Assessment & Plan:  Hyperlipidemia continue Zocor 40 daily, and an aspirin tablet.  Lumbar disk disease with pain from right hip two posterior right knee and numbness.  Normal.  Neurologic exam.  Plan PT, and Motrin pain clinic if symptoms persist or

## 2010-12-31 NOTE — Patient Instructions (Signed)
Continue your current medications.  Motrin 600 mg twice daily with food.  We will begin physical therapy ASAP for your lumbar disk disease.  If however, in a couple weeks of physical therapy.  We don't see any improvement then the next step is to go to the pain clinic and consider an epidural steroid injection.  Return yearly for your annual exam

## 2010-12-31 NOTE — Progress Notes (Signed)
Addended by: Bonnye Fava on: 12/31/2010 04:02 PM   Modules accepted: Orders

## 2011-01-03 NOTE — Progress Notes (Signed)
Left message on machine for patient

## 2011-01-13 ENCOUNTER — Ambulatory Visit: Payer: Medicare HMO | Attending: Family Medicine

## 2011-01-13 DIAGNOSIS — M25659 Stiffness of unspecified hip, not elsewhere classified: Secondary | ICD-10-CM | POA: Insufficient documentation

## 2011-01-13 DIAGNOSIS — IMO0001 Reserved for inherently not codable concepts without codable children: Secondary | ICD-10-CM | POA: Insufficient documentation

## 2011-01-13 DIAGNOSIS — M25569 Pain in unspecified knee: Secondary | ICD-10-CM | POA: Insufficient documentation

## 2011-01-13 DIAGNOSIS — R5381 Other malaise: Secondary | ICD-10-CM | POA: Insufficient documentation

## 2011-01-17 ENCOUNTER — Ambulatory Visit: Payer: Medicare HMO | Admitting: Physical Therapy

## 2011-01-23 ENCOUNTER — Ambulatory Visit: Payer: Medicare HMO

## 2011-01-30 ENCOUNTER — Ambulatory Visit: Payer: Medicare HMO

## 2011-03-20 ENCOUNTER — Telehealth: Payer: Self-pay | Admitting: Family Medicine

## 2011-03-20 NOTE — Telephone Encounter (Signed)
I called Daniel Hampton,,,,,,,, he has tendinitis in his wrist, and once a shot of steroids because he wants to play golf today explained.  I do not think a shot of steroids would be indicated.  Recommended and splinting elevation, ice, Motrin, 600 b.i.d. With food, and explained it takes a couple weeks for these things to go way

## 2011-03-20 NOTE — Telephone Encounter (Signed)
Patient would like to be worked in today for left wrist pain. He wants a cortisone shot so he can play golf today. Please advise to where to be worked in?

## 2012-03-03 ENCOUNTER — Other Ambulatory Visit: Payer: Self-pay | Admitting: Dermatology

## 2012-04-14 ENCOUNTER — Ambulatory Visit (INDEPENDENT_AMBULATORY_CARE_PROVIDER_SITE_OTHER): Payer: Medicare HMO | Admitting: Family Medicine

## 2012-04-14 ENCOUNTER — Encounter: Payer: Self-pay | Admitting: Family Medicine

## 2012-04-14 VITALS — BP 110/70 | Temp 97.8°F | Wt 217.0 lb

## 2012-04-14 DIAGNOSIS — M51379 Other intervertebral disc degeneration, lumbosacral region without mention of lumbar back pain or lower extremity pain: Secondary | ICD-10-CM

## 2012-04-14 DIAGNOSIS — H8110 Benign paroxysmal vertigo, unspecified ear: Secondary | ICD-10-CM

## 2012-04-14 DIAGNOSIS — K5730 Diverticulosis of large intestine without perforation or abscess without bleeding: Secondary | ICD-10-CM

## 2012-04-14 DIAGNOSIS — N138 Other obstructive and reflux uropathy: Secondary | ICD-10-CM

## 2012-04-14 DIAGNOSIS — Z85828 Personal history of other malignant neoplasm of skin: Secondary | ICD-10-CM

## 2012-04-14 DIAGNOSIS — N139 Obstructive and reflux uropathy, unspecified: Secondary | ICD-10-CM

## 2012-04-14 DIAGNOSIS — M5137 Other intervertebral disc degeneration, lumbosacral region: Secondary | ICD-10-CM

## 2012-04-14 DIAGNOSIS — N401 Enlarged prostate with lower urinary tract symptoms: Secondary | ICD-10-CM

## 2012-04-14 DIAGNOSIS — Z23 Encounter for immunization: Secondary | ICD-10-CM

## 2012-04-14 DIAGNOSIS — E785 Hyperlipidemia, unspecified: Secondary | ICD-10-CM

## 2012-04-14 LAB — CBC WITH DIFFERENTIAL/PLATELET
Basophils Absolute: 0 10*3/uL (ref 0.0–0.1)
Eosinophils Absolute: 0.2 10*3/uL (ref 0.0–0.7)
HCT: 44.1 % (ref 39.0–52.0)
Lymphs Abs: 2.6 10*3/uL (ref 0.7–4.0)
MCHC: 33.4 g/dL (ref 30.0–36.0)
MCV: 93.7 fl (ref 78.0–100.0)
Monocytes Absolute: 0.5 10*3/uL (ref 0.1–1.0)
Monocytes Relative: 7.8 % (ref 3.0–12.0)
Platelets: 199 10*3/uL (ref 150.0–400.0)
RDW: 12.7 % (ref 11.5–14.6)

## 2012-04-14 LAB — POCT URINALYSIS DIPSTICK
Leukocytes, UA: NEGATIVE
Nitrite, UA: NEGATIVE
Protein, UA: NEGATIVE
Urobilinogen, UA: 0.2
pH, UA: 5.5

## 2012-04-14 LAB — PSA: PSA: 3.84 ng/mL (ref 0.10–4.00)

## 2012-04-14 MED ORDER — SIMVASTATIN 40 MG PO TABS: 40.0000 mg | ORAL_TABLET | Freq: Every evening | ORAL | Status: DC

## 2012-04-14 NOTE — Patient Instructions (Signed)
Continue your current medications  We will call you a couple days with your lab reports  Remember to continue to wear the sunscreens SPF 50+  Decreasing her caffeine consumption will decrease your urinary tract symptoms  Followup in 1 year sooner if any problems

## 2012-04-14 NOTE — Progress Notes (Signed)
  Subjective:    Patient ID: Daniel Hampton, male    DOB: [birth year], 71 y.o.   MRN: [id number]  HPI Alani is a 71 year old married male nonsmoker who comes in today for a Medicare wellness examination  He takes a 40 mg Zocor tablet along with aspirin daily for hyperlipidemia  He gets routine eye care, dental care, colonoscopy because a history of diverticulosis otherwise normal, tetanus 2008, Pneumovax x2, flu shot today, information given on shingles  Review of systems negative except for BPH with urinary frequency and urgency but only nocturia x1 however he drinks 4 cups of coffee daily    Review of Systems  Constitutional: Negative.   HENT: Negative.   Eyes: Negative.   Respiratory: Negative.   Cardiovascular: Negative.   Gastrointestinal: Negative.   Genitourinary: Negative.   Musculoskeletal: Negative.   Skin: Negative.   Neurological: Negative.   Hematological: Negative.   Psychiatric/Behavioral: Negative.        Objective:   Physical Exam  Constitutional: He is oriented to person, place, and time. He appears well-developed and well-nourished.  HENT:  Head: Normocephalic and atraumatic.  Right Ear: External ear normal.  Left Ear: External ear normal.  Nose: Nose normal.  Mouth/Throat: Oropharynx is clear and moist.  Eyes: Conjunctivae normal and EOM are normal. Pupils are equal, round, and reactive to light.  Neck: Normal range of motion. Neck supple. No JVD present. No tracheal deviation present. No thyromegaly present.  Cardiovascular: Normal rate, regular rhythm, normal heart sounds and intact distal pulses.  Exam reveals no gallop and no friction rub.   No murmur heard. Pulmonary/Chest: Effort normal and breath sounds normal. No stridor. No respiratory distress. He has no wheezes. He has no rales. He exhibits no tenderness.  Abdominal: Soft. Bowel sounds are normal. He exhibits no distension and no mass. There is no tenderness. There is no rebound and no guarding.    Genitourinary: Rectum normal, prostate normal and penis normal. Guaiac negative stool. No penile tenderness.       Golfball sized cystocele right scrotum  Musculoskeletal: Normal range of motion. He exhibits no edema and no tenderness.  Lymphadenopathy:    He has no cervical adenopathy.  Neurological: He is alert and oriented to person, place, and time. He has normal reflexes. No cranial nerve deficit. He exhibits normal muscle tone.  Skin: Skin is warm and dry. No rash noted. No erythema. No pallor.  Psychiatric: He has a normal mood and affect. His behavior is normal. Judgment and thought content normal.   Total body skin exam normal except for scar on his nose from previous skin cancer removal by Dr. Satira Sark       Assessment & Plan:  Healthy male  Hyperlipidemia check labs  Cystocele continue to observe  Symptoms of BPH with outlet obstruction,,,,,, decrease caffeine consumption  History of skin cancer on the nose continue to wear sunscreens yearly skin exam by Dr. dermatologist

## 2012-04-15 ENCOUNTER — Telehealth: Payer: Self-pay | Admitting: *Deleted

## 2012-04-15 DIAGNOSIS — Z23 Encounter for immunization: Secondary | ICD-10-CM

## 2012-04-15 DIAGNOSIS — Z0000 Encounter for general adult medical examination without abnormal findings: Secondary | ICD-10-CM

## 2012-04-15 LAB — LIPID PANEL: Triglycerides: 92 mg/dL (ref 0.0–149.0)

## 2012-04-15 LAB — HEPATIC FUNCTION PANEL
ALT: 33 U/L (ref 0–53)
AST: 37 U/L (ref 0–37)
Total Bilirubin: 1 mg/dL (ref 0.3–1.2)

## 2012-04-15 LAB — BASIC METABOLIC PANEL
BUN: 22 mg/dL (ref 6–23)
Creatinine, Ser: 1.1 mg/dL (ref 0.4–1.5)
GFR: 89.22 mL/min (ref >60.00)
Glucose, Bld: 100 mg/dL — ABNORMAL HIGH (ref 70–99)

## 2012-04-15 MED ORDER — ZOSTER VACCINE LIVE 19400 UNT/0.65ML ~~LOC~~ SOLR: 0.6500 mL | Freq: Once | SUBCUTANEOUS | Status: AC

## 2012-04-15 NOTE — Telephone Encounter (Signed)
-   Rx for shingles vaccine sent to pharmacy

## 2012-04-16 LAB — LDL CHOLESTEROL, DIRECT: Direct LDL: 194.7 mg/dL

## 2012-04-19 ENCOUNTER — Other Ambulatory Visit: Payer: Self-pay | Admitting: Family Medicine

## 2012-04-20 ENCOUNTER — Other Ambulatory Visit: Payer: Self-pay | Admitting: *Deleted

## 2012-04-20 MED ORDER — ATORVASTATIN CALCIUM 40 MG PO TABS: 40.0000 mg | ORAL_TABLET | Freq: Every day | ORAL | Status: DC

## 2012-09-15 ENCOUNTER — Other Ambulatory Visit: Payer: Self-pay | Admitting: *Deleted

## 2012-09-15 MED ORDER — HYDROCODONE-HOMATROPINE 5-1.5 MG/5ML PO SYRP: 5.0000 mL | ORAL_SOLUTION | Freq: Three times a day (TID) | ORAL | Status: DC | PRN

## 2013-04-26 ENCOUNTER — Other Ambulatory Visit: Payer: Self-pay | Admitting: Dermatology

## 2013-04-26 DIAGNOSIS — C4492 Squamous cell carcinoma of skin, unspecified: Secondary | ICD-10-CM

## 2013-04-26 HISTORY — DX: Squamous cell carcinoma of skin, unspecified: C44.92

## 2013-06-13 ENCOUNTER — Other Ambulatory Visit: Payer: Self-pay | Admitting: Family Medicine

## 2013-06-21 ENCOUNTER — Ambulatory Visit (INDEPENDENT_AMBULATORY_CARE_PROVIDER_SITE_OTHER): Payer: Medicare Other | Admitting: Family Medicine

## 2013-06-21 ENCOUNTER — Encounter: Payer: Self-pay | Admitting: Family Medicine

## 2013-06-21 VITALS — BP 140/90 | Temp 98.0°F | Ht 71.0 in | Wt 233.0 lb

## 2013-06-21 DIAGNOSIS — E785 Hyperlipidemia, unspecified: Secondary | ICD-10-CM

## 2013-06-21 DIAGNOSIS — Z0000 Encounter for general adult medical examination without abnormal findings: Secondary | ICD-10-CM

## 2013-06-21 DIAGNOSIS — K5730 Diverticulosis of large intestine without perforation or abscess without bleeding: Secondary | ICD-10-CM

## 2013-06-21 LAB — POCT URINALYSIS DIPSTICK
Bilirubin, UA: NEGATIVE
Blood, UA: NEGATIVE
Glucose, UA: NEGATIVE
Ketones, UA: NEGATIVE
Nitrite, UA: NEGATIVE
Spec Grav, UA: 1.015

## 2013-06-21 LAB — LIPID PANEL
Total CHOL/HDL Ratio: 4
VLDL: 34.4 mg/dL (ref 0.0–40.0)

## 2013-06-21 LAB — CBC WITH DIFFERENTIAL/PLATELET
Basophils Relative: 0.5 % (ref 0.0–3.0)
Eosinophils Relative: 2 % (ref 0.0–5.0)
Hemoglobin: 15.6 g/dL (ref 13.0–17.0)
Lymphocytes Relative: 33.6 % (ref 12.0–46.0)
MCHC: 33.8 g/dL (ref 30.0–36.0)
Monocytes Absolute: 0.6 10*3/uL (ref 0.1–1.0)
Monocytes Relative: 5.9 % (ref 3.0–12.0)
Neutro Abs: 5.7 10*3/uL (ref 1.4–7.7)
Platelets: 239 10*3/uL (ref 150.0–400.0)
RBC: 4.94 Mil/uL (ref 4.22–5.81)
WBC: 9.9 10*3/uL (ref 4.5–10.5)

## 2013-06-21 LAB — BASIC METABOLIC PANEL
BUN: 14 mg/dL (ref 6–23)
CO2: 31 mEq/L (ref 19–32)
Chloride: 102 mEq/L (ref 96–112)
GFR: 83.4 mL/min (ref >60.00)
Glucose, Bld: 89 mg/dL (ref 70–99)
Potassium: 5 mEq/L (ref 3.5–5.1)
Sodium: 140 mEq/L (ref 135–145)

## 2013-06-21 LAB — HEPATIC FUNCTION PANEL
ALT: 34 U/L (ref 0–53)
Albumin: 4.6 g/dL (ref 3.5–5.2)
Alkaline Phosphatase: 49 U/L (ref 39–117)

## 2013-06-21 LAB — TSH: TSH: 3.13 u[IU]/mL (ref 0.35–5.50)

## 2013-06-21 LAB — PSA: PSA: 4.45 ng/mL — ABNORMAL HIGH (ref 0.10–4.00)

## 2013-06-21 MED ORDER — ATORVASTATIN CALCIUM 40 MG PO TABS: ORAL_TABLET | ORAL | Status: DC

## 2013-06-21 NOTE — Progress Notes (Signed)
  Subjective:    Patient ID: Daniel Hampton, male    DOB: August 24, 1940, 72 y.o.   MRN: [id number]  HPI [name] is a 72 year old married male nonsmoker who comes in today for his Medicare wellness examination  He takes Lipitor and aspirin one of each daily  He gets routine eye care, dental care, colonoscopy and GI, vaccinations up-to-date  Cognitive function normal he exercises on a regular basis home health safety reviewed no issues identified, no guns in the house, he does have a health care power of attorney and living well   Review of Systems  Constitutional: Negative.   HENT: Negative.   Eyes: Negative.   Respiratory: Negative.   Cardiovascular: Negative.   Gastrointestinal: Negative.   Endocrine: Negative.   Genitourinary: Negative.   Musculoskeletal: Negative.   Skin: Negative.   Allergic/Immunologic: Negative.   Neurological: Negative.   Hematological: Negative.   Psychiatric/Behavioral: Negative.        Objective:   Physical Exam  Nursing note and vitals reviewed. Constitutional: He is oriented to person, place, and time. He appears well-developed and well-nourished.  HENT:  Head: Normocephalic and atraumatic.  Right Ear: External ear normal.  Left Ear: External ear normal.  Nose: Nose normal.  Mouth/Throat: Oropharynx is clear and moist.  Eyes: Conjunctivae and EOM are normal. Pupils are equal, round, and reactive to light.  Neck: Normal range of motion. Neck supple. No JVD present. No tracheal deviation present. No thyromegaly present.  Cardiovascular: Normal rate, regular rhythm, normal heart sounds and intact distal pulses.  Exam reveals no gallop and no friction rub.   No murmur heard. Pulmonary/Chest: Effort normal and breath sounds normal. No stridor. No respiratory distress. He has no wheezes. He has no rales. He exhibits no tenderness.  Abdominal: Soft. Bowel sounds are normal. He exhibits no distension and no mass. There is no tenderness. There is no rebound and  no guarding.  Genitourinary: Rectum normal, prostate normal and penis normal. Guaiac negative stool. No penile tenderness.  Musculoskeletal: Normal range of motion. He exhibits no edema and no tenderness.  Lymphadenopathy:    He has no cervical adenopathy.  Neurological: He is alert and oriented to person, place, and time. He has normal reflexes. No cranial nerve deficit. He exhibits normal muscle tone.  Skin: Skin is warm and dry. No rash noted. No erythema. No pallor.  Total body skin exam normal birthmark right arm  Psychiatric: He has a normal mood and affect. His behavior is normal. Judgment and thought content normal.          Assessment & Plan:  Healthy male  Hyperlipidemia continue Lipitor and aspirin

## 2013-06-21 NOTE — Progress Notes (Signed)
Pre visit review using our clinic review tool, if applicable. No additional management support is needed unless otherwise documented below in the visit note. 

## 2013-06-21 NOTE — Patient Instructions (Signed)
Continue your current medications and exercise program  Followup in 1 year sooner if any problems  We will call in a day or 2 with report on your lab work

## 2013-06-28 ENCOUNTER — Encounter: Payer: Medicare HMO | Admitting: Family Medicine

## 2013-09-19 ENCOUNTER — Telehealth: Payer: Self-pay | Admitting: Family Medicine

## 2013-09-19 MED ORDER — HYDROCODONE-HOMATROPINE 5-1.5 MG/5ML PO SYRP: 5.0000 mL | ORAL_SOLUTION | Freq: Three times a day (TID) | ORAL | Status: DC | PRN

## 2013-09-19 NOTE — Telephone Encounter (Addendum)
Pt has bad cough, and states he has tried otc. Only thing that helps is  HYDROcodone-homatropine (HYCODAN) 5-1.5 MG/5ML syrup Pt would like rx pls advise

## 2013-09-19 NOTE — Telephone Encounter (Signed)
Patient is aware 

## 2013-09-19 NOTE — Telephone Encounter (Signed)
Available for pick up. 

## 2013-10-10 ENCOUNTER — Ambulatory Visit (INDEPENDENT_AMBULATORY_CARE_PROVIDER_SITE_OTHER): Payer: Medicare Other | Admitting: Family Medicine

## 2013-10-10 ENCOUNTER — Encounter: Payer: Self-pay | Admitting: Family Medicine

## 2013-10-10 VITALS — BP 146/80 | HR 60 | Temp 97.6°F | Resp 20 | Wt 242.0 lb

## 2013-10-10 DIAGNOSIS — Z860100 Personal history of colon polyps, unspecified: Secondary | ICD-10-CM

## 2013-10-10 DIAGNOSIS — K625 Hemorrhage of anus and rectum: Secondary | ICD-10-CM

## 2013-10-10 NOTE — Progress Notes (Signed)
   Subjective:    Patient ID: Daniel Hampton, male    DOB: Sep 26, 1940, 73 y.o.   MRN: [id number]  HPI Daniel Hampton is a 73 year old male who comes in today because of an episode 10 days ago of bright red rectal bleeding x3  That day he had to strain a little bit ago and had 3 episodes of rectal bleeding. None since. He's had 5 colonoscopies because of a history of colon polyps.  GI review of systems otherwise negative   Review of Systems    negative Objective:   Physical Exam  Well-developed well-nourished male no acute distress vital signs stable he is afebrile examination of the rectum shows a scar down external hemorrhoid. Digital rectal exam shows normal prostate no palpable masses brown stool guaiac negative      Assessment & Plan:  Episode of bright red rectal bleeding because of a history of colon polyps we'll refer back to GI for consultation

## 2013-10-10 NOTE — Progress Notes (Signed)
Pre-visit discussion using our clinic review tool. No additional management support is needed unless otherwise documented below in the visit note.  

## 2013-10-10 NOTE — Patient Instructions (Signed)
.   Call GI........... [phone],,,,,,,,, asked to make an appointment with your gastroenterologist to discuss this recent episode

## 2013-10-11 ENCOUNTER — Telehealth: Payer: Self-pay | Admitting: Internal Medicine

## 2013-10-11 NOTE — Telephone Encounter (Signed)
Patient had rectal bleeding 10 days ago, none since.  He was seen by Dr. Sherren Mocha yesterday.  He is due for a colon in September of this year.  Dr. Sherren Mocha thought bleeding was most likely hemorrhoidal according to the patient, but wanted GI consult. He is scheduled for 11/23/13 10:00

## 2013-11-23 ENCOUNTER — Ambulatory Visit (INDEPENDENT_AMBULATORY_CARE_PROVIDER_SITE_OTHER): Payer: Medicare Other | Admitting: Internal Medicine

## 2013-11-23 ENCOUNTER — Encounter: Payer: Self-pay | Admitting: Internal Medicine

## 2013-11-23 VITALS — BP 122/80 | HR 60 | Ht 71.0 in | Wt 237.0 lb

## 2013-11-23 DIAGNOSIS — K625 Hemorrhage of anus and rectum: Secondary | ICD-10-CM

## 2013-11-23 DIAGNOSIS — Z86010 Personal history of colonic polyps: Secondary | ICD-10-CM

## 2013-11-23 MED ORDER — NA SULFATE-K SULFATE-MG SULF 17.5-3.13-1.6 GM/177ML PO SOLN: ORAL | Status: DC

## 2013-11-23 NOTE — Patient Instructions (Signed)

## 2013-11-23 NOTE — Progress Notes (Signed)
         Subjective:    Patient ID: Daniel Hampton, male    DOB: [birth year], 73 y.o.   MRN: [id number]  HPI Patient is a very nice retired elderly man ([employer])here because of recent rectal bleeding. He is due for a routine colonoscopy for polyp followup in September of this year. He had some rectal bleeding he saw Dr. Sherren Mocha he recommended he come to see me sooner than September. He has had some rare intermittent rectal bleeding attributed to hemorrhoids. He has not had further bleeding at this time and feels well overall. He would like to schedule his routine repeat colonoscopy. Medications, allergies, past medical history, past surgical history, family history and social history are reviewed and updated in the EMR.   Review of Systems As above    Objective:   Physical Exam General:  NAD Eyes:   anicteric Lungs:  clear Heart:  S1S2 no rubs, murmurs or gallops Abdomen:  soft and nontender, BS+, no HSM or mass     Data Reviewed:  Lab Results  Component Value Date   WBC 9.9 06/21/2013   HGB 15.6 06/21/2013   HCT 46.0 06/21/2013   MCV 93.3 06/21/2013   PLT 239.0 06/21/2013      Assessment & Plan:   1. Personal history of colonic polyps   2. Rectal bleeding    Will schedule colonoscopy to evaluate problems 1 and 2. I did have a brief discussion regarding his rectal bleeding and hemorrhoids and possible treatment of this is really very rare and he is not inclined to pursue hemorrhoidal banding, I think that's fine.

## 2013-12-08 ENCOUNTER — Encounter: Payer: Self-pay | Admitting: Internal Medicine

## 2013-12-27 ENCOUNTER — Ambulatory Visit (AMBULATORY_SURGERY_CENTER): Payer: Medicare Other | Admitting: Internal Medicine

## 2013-12-27 ENCOUNTER — Encounter: Payer: Self-pay | Admitting: Internal Medicine

## 2013-12-27 VITALS — BP 120/78 | HR 51 | Temp 96.0°F | Resp 14 | Ht 71.0 in | Wt 237.0 lb

## 2013-12-27 DIAGNOSIS — K5730 Diverticulosis of large intestine without perforation or abscess without bleeding: Secondary | ICD-10-CM

## 2013-12-27 DIAGNOSIS — D126 Benign neoplasm of colon, unspecified: Secondary | ICD-10-CM

## 2013-12-27 DIAGNOSIS — Z86010 Personal history of colonic polyps: Secondary | ICD-10-CM

## 2013-12-27 MED ORDER — SODIUM CHLORIDE 0.9 % IV SOLN: 500.0000 mL | INTRAVENOUS | Status: DC

## 2013-12-27 NOTE — Patient Instructions (Addendum)
I found and removed 3 small polyps that look benign. You also have a condition called diverticulosis - common and not usually a problem. Please read the handout provided. Internal hemorrhoids also.  If you have hemorrhoid problems (swelling, itching, bleeding) I am able to treat those with an in-office procedure. If you like, please call my office at [phone] to schedule an appointment and I can evaluate you further.  I will let you know pathology results and when to have another routine colonoscopy by mail.  I appreciate the opportunity to care for you. Gatha Mayer, MD, FACG  YOU HAD AN ENDOSCOPIC PROCEDURE TODAY AT [facility]: Refer to the procedure report that was given to you for any specific questions about what was found during the examination.  If the procedure report does not answer your questions, please call your gastroenterologist to clarify.  If you requested that your care partner not be given the details of your procedure findings, then the procedure report has been included in a sealed envelope for you to review at your convenience later.  YOU SHOULD EXPECT: Some feelings of bloating in the abdomen. Passage of more gas than usual.  Walking can help get rid of the air that was put into your GI tract during the procedure and reduce the bloating. If you had a lower endoscopy (such as a colonoscopy or flexible sigmoidoscopy) you may notice spotting of blood in your stool or on the toilet paper. If you underwent a bowel prep for your procedure, then you may not have a normal bowel movement for a few days.  DIET: Your first meal following the procedure should be a light meal and then it is ok to progress to your normal diet.  A half-sandwich or bowl of soup is an example of a good first meal.  Heavy or fried foods are harder to digest and may make you feel nauseous or bloated.  Likewise meals heavy in dairy and vegetables can cause extra gas to form and this can also  increase the bloating.  Drink plenty of fluids but you should avoid alcoholic beverages for 24 hours.  ACTIVITY: Your care partner should take you home directly after the procedure.  You should plan to take it easy, moving slowly for the rest of the day.  You can resume normal activity the day after the procedure however you should NOT DRIVE or use heavy machinery for 24 hours (because of the sedation medicines used during the test).    SYMPTOMS TO REPORT IMMEDIATELY: A gastroenterologist can be reached at any hour.  During normal business hours, 8:30 AM to 5:00 PM Monday through Friday, call [phone].  After hours and on weekends, please call the GI answering service at [phone] who will take a message and have the physician on call contact you.   Following lower endoscopy (colonoscopy or flexible sigmoidoscopy):  Excessive amounts of blood in the stool  Significant tenderness or worsening of abdominal pains  Swelling of the abdomen that is new, acute  Fever of 100F or higher  FOLLOW UP: If any biopsies were taken you will be contacted by phone or by letter within the next 1-3 weeks.  Call your gastroenterologist if you have not heard about the biopsies in 3 weeks.  Our staff will call the home number listed on your records the next business day following your procedure to check on you and address any questions or concerns that you may have at that time  regarding the information given to you following your procedure. This is a courtesy call and so if there is no answer at the home number and we have not heard from you through the emergency physician on call, we will assume that you have returned to your regular daily activities without incident.  SIGNATURES/CONFIDENTIALITY: You and/or your care partner have signed paperwork which will be entered into your electronic medical record.  These signatures attest to the fact that that the information above on your After Visit Summary has  been reviewed and is understood.  Full responsibility of the confidentiality of this discharge information lies with you and/or your care-partner.  Read the handouts about polyps, hemorrhoids and diverticulosis.

## 2013-12-27 NOTE — Progress Notes (Signed)
Called to room to assist during endoscopic procedure.  Patient ID and intended procedure confirmed with present staff. Received instructions for my participation in the procedure from the performing physician.  

## 2013-12-27 NOTE — Op Note (Signed)
[facility]  [address] [place], [id number]   COLONOSCOPY PROCEDURE REPORT  PATIENT: Daniel Hampton, Daniel Hampton  MR#: [id number] BIRTHDATE: 1941/01/15 , 19  yrs. old GENDER: Male ENDOSCOPIST: Gatha Mayer, MD, [facility] PROCEDURE DATE:  12/27/2013 PROCEDURE:   Colonoscopy with snare polypectomy First Screening Colonoscopy - Avg.  risk and is 50 yrs.  old or older - No.  Prior Negative Screening - Now for repeat screening. N/A  History of Adenoma - Now for follow-up colonoscopy & has been > or = to 3 yrs.  Yes hx of adenoma.  Has been 3 or more years since last colonoscopy.  Polyps Removed Today? Yes. ASA CLASS:   Class III INDICATIONS:Patient's personal history of adenomatous colon polyps.  MEDICATIONS: propofol (Diprivan) 300mg  IV, MAC sedation, administered by CRNA, and These medications were titrated to patient response per physician's verbal order  DESCRIPTION OF PROCEDURE:   After the risks benefits and alternatives of the procedure were thoroughly explained, informed consent was obtained.  A digital rectal exam revealed no abnormalities of the rectum, A digital rectal exam revealed no prostatic nodules, and A digital rectal exam revealed the prostate was not enlarged.   The LB [id number]  endoscope was introduced through the anus and advanced to the cecum, which was identified by both the appendix and ileocecal valve. No adverse events experienced.   The quality of the prep was excellent using Suprep  The instrument was then slowly withdrawn as the colon was fully examined.  COLON FINDINGS: Three diminutive sessile polyps were found at the cecum and in the transverse colon.  A polypectomy was performed with a cold snare.  The resection was complete and the polyp tissue was completely retrieved.   Severe diverticulosis was noted in the sigmoid colon.   The colon mucosa was otherwise normal. Retroflexed views revealed internal hemorrhoids. The time to cecum=3  minutes 50 seconds.  Withdrawal time=12 minutes 19 seconds. The scope was withdrawn and the procedure completed. COMPLICATIONS: There were no complications.  ENDOSCOPIC IMPRESSION: 1.   Three diminutive sessile polyps were found at the cecum and in the transverse colon; polypectomy was performed with a cold snare 2.   Severe diverticulosis was noted in the sigmoid colon 3.   The colon mucosa was otherwise normal - internal hemorrhoids in rectum  RECOMMENDATIONS: 1.  Timing of repeat colonoscopy will be determined by pathology findings. 2.   F/u as needed for hemorrhoids   eSigned:  Gatha Mayer, MD, [facility] 12/27/2013 3:18 PM  cc: The Patient

## 2013-12-28 ENCOUNTER — Telehealth: Payer: Self-pay | Admitting: *Deleted

## 2013-12-28 NOTE — Telephone Encounter (Signed)
  Follow up Call-  Call back number 12/27/2013  Post procedure Call Back phone  # [phone]  Permission to leave phone message Yes     Patient questions:  Do you have a fever, pain , or abdominal swelling? no Pain Score  0 *  Have you tolerated food without any problems? yes  Have you been able to return to your normal activities? yes  Do you have any questions about your discharge instructions: Diet   no Medications  no Follow up visit  no  Do you have questions or concerns about your Care? no  Actions: * If pain score is 4 or above: No action needed, pain <4.

## 2014-01-02 ENCOUNTER — Encounter: Payer: Self-pay | Admitting: Internal Medicine

## 2014-01-02 NOTE — Progress Notes (Signed)
Quick Note:  2 diminutive adenomas and 1 mucosal polyp ______

## 2014-08-02 ENCOUNTER — Other Ambulatory Visit (INDEPENDENT_AMBULATORY_CARE_PROVIDER_SITE_OTHER): Payer: Medicare Other

## 2014-08-02 DIAGNOSIS — E785 Hyperlipidemia, unspecified: Secondary | ICD-10-CM

## 2014-08-02 DIAGNOSIS — Z0000 Encounter for general adult medical examination without abnormal findings: Secondary | ICD-10-CM

## 2014-08-02 LAB — CBC WITH DIFFERENTIAL/PLATELET
BASOS ABS: 0 10*3/uL (ref 0.0–0.1)
Basophils Relative: 0.6 % (ref 0.0–3.0)
EOS PCT: 2.6 % (ref 0.0–5.0)
Eosinophils Absolute: 0.2 10*3/uL (ref 0.0–0.7)
HEMATOCRIT: 43.9 % (ref 39.0–52.0)
Hemoglobin: 14.6 g/dL (ref 13.0–17.0)
LYMPHS ABS: 2.8 10*3/uL (ref 0.7–4.0)
LYMPHS PCT: 36.2 % (ref 12.0–46.0)
MCHC: 33.2 g/dL (ref 30.0–36.0)
MCV: 94.1 fl (ref 78.0–100.0)
MONOS PCT: 7.8 % (ref 3.0–12.0)
Monocytes Absolute: 0.6 10*3/uL (ref 0.1–1.0)
NEUTROS ABS: 4 10*3/uL (ref 1.4–7.7)
Neutrophils Relative %: 52.8 % (ref 43.0–77.0)
Platelets: 203 10*3/uL (ref 150.0–400.0)
RBC: 4.66 Mil/uL (ref 4.22–5.81)
RDW: 12.8 % (ref 11.5–15.5)
WBC: 7.7 10*3/uL (ref 4.0–10.5)

## 2014-08-02 LAB — HEPATIC FUNCTION PANEL
ALT: 35 U/L (ref 0–53)
AST: 27 U/L (ref 0–37)
Albumin: 4.1 g/dL (ref 3.5–5.2)
Alkaline Phosphatase: 51 U/L (ref 39–117)
BILIRUBIN DIRECT: 0.1 mg/dL (ref 0.0–0.3)
Total Bilirubin: 0.8 mg/dL (ref 0.2–1.2)
Total Protein: 6.7 g/dL (ref 6.0–8.3)

## 2014-08-02 LAB — POCT URINALYSIS DIPSTICK
Bilirubin, UA: NEGATIVE
Blood, UA: NEGATIVE
GLUCOSE UA: NEGATIVE
Ketones, UA: NEGATIVE
Leukocytes, UA: NEGATIVE
Nitrite, UA: NEGATIVE
PROTEIN UA: NEGATIVE
SPEC GRAV UA: 1.02
Urobilinogen, UA: 0.2
pH, UA: 5.5

## 2014-08-02 LAB — LIPID PANEL
CHOL/HDL RATIO: 4
Cholesterol: 163 mg/dL (ref 0–200)
HDL: 43.6 mg/dL (ref >39.00)
LDL Cholesterol: 98 mg/dL (ref 0–99)
NONHDL: 119.4
TRIGLYCERIDES: 109 mg/dL (ref 0.0–149.0)
VLDL: 21.8 mg/dL (ref 0.0–40.0)

## 2014-08-02 LAB — PSA: PSA: 3.83 ng/mL (ref 0.10–4.00)

## 2014-08-02 LAB — BASIC METABOLIC PANEL
BUN: 13 mg/dL (ref 6–23)
CO2: 29 mEq/L (ref 19–32)
Calcium: 8.9 mg/dL (ref 8.4–10.5)
Chloride: 108 mEq/L (ref 96–112)
Creatinine, Ser: 1 mg/dL (ref 0.4–1.5)
GFR: 96 mL/min (ref >60.00)
Glucose, Bld: 88 mg/dL (ref 70–99)
Potassium: 4.5 mEq/L (ref 3.5–5.1)
Sodium: 143 mEq/L (ref 135–145)

## 2014-08-02 LAB — TSH: TSH: 3.42 u[IU]/mL (ref 0.35–4.50)

## 2014-08-08 ENCOUNTER — Encounter: Payer: Self-pay | Admitting: Family Medicine

## 2014-08-08 ENCOUNTER — Ambulatory Visit (INDEPENDENT_AMBULATORY_CARE_PROVIDER_SITE_OTHER): Payer: Medicare Other | Admitting: Family Medicine

## 2014-08-08 VITALS — BP 130/80 | Temp 97.8°F | Ht 71.0 in | Wt 239.0 lb

## 2014-08-08 DIAGNOSIS — Z79899 Other long term (current) drug therapy: Secondary | ICD-10-CM

## 2014-08-08 DIAGNOSIS — E785 Hyperlipidemia, unspecified: Secondary | ICD-10-CM

## 2014-08-08 DIAGNOSIS — Z0000 Encounter for general adult medical examination without abnormal findings: Secondary | ICD-10-CM

## 2014-08-08 DIAGNOSIS — G5761 Lesion of plantar nerve, right lower limb: Secondary | ICD-10-CM

## 2014-08-08 DIAGNOSIS — Z85828 Personal history of other malignant neoplasm of skin: Secondary | ICD-10-CM

## 2014-08-08 DIAGNOSIS — Z23 Encounter for immunization: Secondary | ICD-10-CM

## 2014-08-08 MED ORDER — ATORVASTATIN CALCIUM 40 MG PO TABS: ORAL_TABLET | ORAL | Status: DC

## 2014-08-08 NOTE — Progress Notes (Signed)
Pre visit review using our clinic review tool, if applicable. No additional management support is needed unless otherwise documented below in the visit note. 

## 2014-08-08 NOTE — Patient Instructions (Signed)
Continue current medications  Consult with Dr. Wylene Simmer about your foot pain

## 2014-08-08 NOTE — Progress Notes (Signed)
   Subjective:    Patient ID: Daniel Hampton, male    DOB: 1940-09-29, 74 y.o.   MRN: [id number]  HPI Khriz is a 74 year old married male nonsmoker who comes in today for general physical examination  He has a history of hyperlipidemia and takes Lipitor 40 mg daily along with an aspirin tablet.  He gets routine eye care, dental care, recent colonoscopy 2015 showed 3 small polyps. They were removed. He was told to report malignant polyps. He's due to go back in 5 years for follow-up  He exercises of the [employer] days a week  He sees his dermatologist yearly because he's had skin cancer. He had a basal cell carcinoma of his nose.  Cognitive function normally exercises daily home health safety reviewed no issues identified, no guns in the house, he does have a healthcare power of attorney and living well.  With his exercise he cannot do anything to bears weight on his feet because of the Morton's neuromas. [facility] still [facility] [place] [employer] Referred to Dr. Wylene Simmer  Vaccinations updated by Apolonio Schneiders   Review of Systems  Constitutional: Negative.   HENT: Negative.   Eyes: Negative.   Respiratory: Negative.   Cardiovascular: Negative.   Gastrointestinal: Negative.   Endocrine: Negative.   Genitourinary: Negative.   Musculoskeletal: Negative.   Skin: Negative.   Allergic/Immunologic: Negative.   Neurological: Negative.   Hematological: Negative.   Psychiatric/Behavioral: Negative.        Objective:   Physical Exam  Constitutional: He is oriented to person, place, and time. He appears well-developed and well-nourished.  HENT:  Head: Normocephalic and atraumatic.  Right Ear: External ear normal.  Left Ear: External ear normal.  Nose: Nose normal.  Mouth/Throat: Oropharynx is clear and moist.  Eyes: Conjunctivae and EOM are normal. Pupils are equal, round, and reactive to light.  Neck: Normal range of motion. Neck supple. No JVD present. No tracheal deviation present. No thyromegaly  present.  Cardiovascular: Normal rate, regular rhythm, normal heart sounds and intact distal pulses.  Exam reveals no gallop and no friction rub.   No murmur heard. No carotid nor aortic bruits  Pulmonary/Chest: Effort normal and breath sounds normal. No stridor. No respiratory distress. He has no wheezes. He has no rales. He exhibits no tenderness.  Abdominal: Soft. Bowel sounds are normal. He exhibits no distension and no mass. There is no tenderness. There is no rebound and no guarding.  Genitourinary: Rectum normal and penis normal. Guaiac negative stool. No penile tenderness.  1+ symmetrical BPH  Musculoskeletal: Normal range of motion. He exhibits no edema or tenderness.  Lymphadenopathy:    He has no cervical adenopathy.  Neurological: He is alert and oriented to person, place, and time. He has normal reflexes. No cranial nerve deficit. He exhibits normal muscle tone.  Skin: Skin is warm and dry. No rash noted. No erythema. No pallor.  Total body skin exam normal  Psychiatric: He has a normal mood and affect. His behavior is normal. Judgment and thought content normal.  Nursing note and vitals reviewed.         Assessment & Plan:  Healthy male  Hyperlipidemia continue current medications  History of Morton's neuroma referred orthopedist Dr. Doran Durand  History of skin cancer continue sunscreens followed by dermatology

## 2014-08-15 ENCOUNTER — Other Ambulatory Visit: Payer: Self-pay | Admitting: Family Medicine

## 2014-12-26 ENCOUNTER — Telehealth: Payer: Self-pay | Admitting: Family Medicine

## 2014-12-26 ENCOUNTER — Encounter: Payer: Self-pay | Admitting: Family Medicine

## 2014-12-26 ENCOUNTER — Ambulatory Visit (INDEPENDENT_AMBULATORY_CARE_PROVIDER_SITE_OTHER): Payer: Medicare Other | Admitting: Family Medicine

## 2014-12-26 VITALS — BP 130/80 | Temp 98.8°F | Wt 233.0 lb

## 2014-12-26 DIAGNOSIS — J111 Influenza due to unidentified influenza virus with other respiratory manifestations: Secondary | ICD-10-CM | POA: Insufficient documentation

## 2014-12-26 DIAGNOSIS — J1189 Influenza due to unidentified influenza virus with other manifestations: Secondary | ICD-10-CM | POA: Diagnosis not present

## 2014-12-26 MED ORDER — HYDROCODONE-HOMATROPINE 5-1.5 MG/5ML PO SYRP: 5.0000 mL | ORAL_SOLUTION | Freq: Three times a day (TID) | ORAL | Status: DC | PRN

## 2014-12-26 NOTE — Progress Notes (Signed)
   Subjective:    Patient ID: Daniel Hampton, male    DOB: May 04, 1941, 74 y.o.   MRN: [id number]  HPI Daniel Hampton is a 74 year old married male nonsmoker who comes in today for evaluation of fever chills nausea vomiting aching all over and coughing for 5 days  It started with a cough last Friday. Then yesterday had the fever chills nausea and vomiting. He vomited 3 times. No diarrhea. Vomiting has stopped.   Review of Systems    review of systems otherwise negative no foreign travel Objective:   Physical Exam  Well-developed well-nourished male no acute distress vital signs stable he is afebrile HEENT were negative neck was supple no adenopathy lungs are clear      Assessment & Plan:  Influenza type illness,,,,,,,,,,,, rest at home,,,, Tylenol,,,,,, lots of liquids,,,,, Hydromet

## 2014-12-26 NOTE — Telephone Encounter (Signed)
Please schedule patient for 3:30 today.  Patient is aware. thanks

## 2014-12-26 NOTE — Patient Instructions (Signed)
Rest at home  Drink lots of liquids,,,,,,,,, ginger ale,,,,, water,,,,,, 7 up,,,,,,,, Gatorade  Tylenol,,,,,,,,,, 2 tabs 3 times daily when necessary for fever and chills  Hydromet,,,,,,, 1/2-1 teaspoon 3 times daily. For cough  Return when necessary

## 2014-12-26 NOTE — Telephone Encounter (Signed)
Pt asked for an appt today chest and nasal congestion,nausea

## 2014-12-26 NOTE — Telephone Encounter (Signed)
Pt has been sch

## 2014-12-26 NOTE — Progress Notes (Signed)
Pre visit review using our clinic review tool, if applicable. No additional management support is needed unless otherwise documented below in the visit note. 

## 2015-05-29 ENCOUNTER — Encounter: Payer: Self-pay | Admitting: Internal Medicine

## 2015-08-14 ENCOUNTER — Other Ambulatory Visit: Payer: Self-pay | Admitting: Family Medicine

## 2015-09-03 ENCOUNTER — Other Ambulatory Visit (INDEPENDENT_AMBULATORY_CARE_PROVIDER_SITE_OTHER): Payer: Medicare Other

## 2015-09-03 DIAGNOSIS — Z0000 Encounter for general adult medical examination without abnormal findings: Secondary | ICD-10-CM

## 2015-09-03 DIAGNOSIS — E785 Hyperlipidemia, unspecified: Secondary | ICD-10-CM | POA: Diagnosis not present

## 2015-09-03 DIAGNOSIS — Z125 Encounter for screening for malignant neoplasm of prostate: Secondary | ICD-10-CM | POA: Diagnosis not present

## 2015-09-03 LAB — POC URINALSYSI DIPSTICK (AUTOMATED)
BILIRUBIN UA: NEGATIVE
Glucose, UA: NEGATIVE
KETONES UA: NEGATIVE
LEUKOCYTES UA: NEGATIVE
NITRITE UA: NEGATIVE
PH UA: 5.5
PROTEIN UA: NEGATIVE
Spec Grav, UA: 1.02
Urobilinogen, UA: 0.2

## 2015-09-03 LAB — LIPID PANEL
CHOL/HDL RATIO: 3
Cholesterol: 144 mg/dL (ref 0–200)
HDL: 42.4 mg/dL (ref >39.00)
LDL Cholesterol: 82 mg/dL (ref 0–99)
NONHDL: 101.67
TRIGLYCERIDES: 97 mg/dL (ref 0.0–149.0)
VLDL: 19.4 mg/dL (ref 0.0–40.0)

## 2015-09-03 LAB — BASIC METABOLIC PANEL
BUN: 15 mg/dL (ref 6–23)
CHLORIDE: 105 meq/L (ref 96–112)
CO2: 26 meq/L (ref 19–32)
CREATININE: 0.93 mg/dL (ref 0.40–1.50)
Calcium: 9 mg/dL (ref 8.4–10.5)
GFR: 101.69 mL/min (ref >60.00)
Glucose, Bld: 97 mg/dL (ref 70–99)
POTASSIUM: 4.1 meq/L (ref 3.5–5.1)
Sodium: 141 mEq/L (ref 135–145)

## 2015-09-03 LAB — CBC WITH DIFFERENTIAL/PLATELET
BASOS PCT: 0.5 % (ref 0.0–3.0)
Basophils Absolute: 0 10*3/uL (ref 0.0–0.1)
EOS PCT: 2.4 % (ref 0.0–5.0)
Eosinophils Absolute: 0.2 10*3/uL (ref 0.0–0.7)
HEMATOCRIT: 45.2 % (ref 39.0–52.0)
HEMOGLOBIN: 15.2 g/dL (ref 13.0–17.0)
LYMPHS PCT: 41.9 % (ref 12.0–46.0)
Lymphs Abs: 2.9 10*3/uL (ref 0.7–4.0)
MCHC: 33.6 g/dL (ref 30.0–36.0)
MCV: 93.2 fl (ref 78.0–100.0)
Monocytes Absolute: 0.4 10*3/uL (ref 0.1–1.0)
Monocytes Relative: 6.5 % (ref 3.0–12.0)
NEUTROS ABS: 3.3 10*3/uL (ref 1.4–7.7)
Neutrophils Relative %: 48.7 % (ref 43.0–77.0)
PLATELETS: 217 10*3/uL (ref 150.0–400.0)
RBC: 4.85 Mil/uL (ref 4.22–5.81)
RDW: 13 % (ref 11.5–15.5)
WBC: 6.8 10*3/uL (ref 4.0–10.5)

## 2015-09-03 LAB — HEPATIC FUNCTION PANEL
ALBUMIN: 4.3 g/dL (ref 3.5–5.2)
ALT: 41 U/L (ref 0–53)
AST: 34 U/L (ref 0–37)
Alkaline Phosphatase: 45 U/L (ref 39–117)
Bilirubin, Direct: 0.1 mg/dL (ref 0.0–0.3)
TOTAL PROTEIN: 6.9 g/dL (ref 6.0–8.3)
Total Bilirubin: 0.7 mg/dL (ref 0.2–1.2)

## 2015-09-03 LAB — PSA: PSA: 3.22 ng/mL (ref 0.10–4.00)

## 2015-09-03 LAB — TSH: TSH: 1.89 u[IU]/mL (ref 0.35–4.50)

## 2015-09-10 ENCOUNTER — Ambulatory Visit (INDEPENDENT_AMBULATORY_CARE_PROVIDER_SITE_OTHER): Payer: Medicare Other | Admitting: Family Medicine

## 2015-09-10 ENCOUNTER — Encounter: Payer: Self-pay | Admitting: Family Medicine

## 2015-09-10 VITALS — BP 140/90 | Temp 97.8°F | Ht 70.75 in | Wt 236.0 lb

## 2015-09-10 DIAGNOSIS — E785 Hyperlipidemia, unspecified: Secondary | ICD-10-CM

## 2015-09-10 DIAGNOSIS — Z0000 Encounter for general adult medical examination without abnormal findings: Secondary | ICD-10-CM

## 2015-09-10 DIAGNOSIS — Z85828 Personal history of other malignant neoplasm of skin: Secondary | ICD-10-CM

## 2015-09-10 MED ORDER — ATORVASTATIN CALCIUM 40 MG PO TABS: 40.0000 mg | ORAL_TABLET | Freq: Every day | ORAL | Status: DC

## 2015-09-10 NOTE — Progress Notes (Signed)
   Subjective:    Patient ID: Daniel Hampton, male    DOB: 06/12/1941, 75 y.o.   MRN: [id number]  HPI Daniel Hampton is a 75 year old married male nonsmoker who comes in today for general physical examination  He takes aspirin and Lipitor daily for hyperlipidemia lipids are at goal  He gets routine eye care, dental care,,,,,,, upper dental plate since he was 75 years of age,,,,,,, colonoscopy 2015 was normal except for some history of polyps.  Vaccinations up-to-date  He also sees dermatology on a regular basis. A couple years ago at a squamous cell carcinoma removed from his nose. [place]  Retired plays golf on a regular basis  Cognitive function normal he exercises daily home health safety reviewed no issues identified, no guns in the house, he does not have a healthcare power of attorney nor living well. He was encouraged to do that this year   Review of Systems  Constitutional: Negative.   HENT: Negative.   Eyes: Negative.   Respiratory: Negative.   Cardiovascular: Negative.   Gastrointestinal: Negative.   Endocrine: Negative.   Genitourinary: Negative.   Musculoskeletal: Negative.   Skin: Negative.   Allergic/Immunologic: Negative.   Neurological: Negative.   Hematological: Negative.   Psychiatric/Behavioral: Negative.        Objective:   Physical Exam  Constitutional: He is oriented to person, place, and time. He appears well-developed and well-nourished.  HENT:  Head: Normocephalic and atraumatic.  Right Ear: External ear normal.  Left Ear: External ear normal.  Nose: Nose normal.  Mouth/Throat: Oropharynx is clear and moist.  Cerumen impaction right ear removed with irrigation  Eyes: Conjunctivae and EOM are normal. Pupils are equal, round, and reactive to light.  Neck: Normal range of motion. Neck supple. No JVD present. No tracheal deviation present. No thyromegaly present.  Cardiovascular: Normal rate, regular rhythm, normal heart sounds and intact  distal pulses.  Exam reveals no gallop and no friction rub.   No murmur heard. No carotid nor aortic bruits peripheral pulses 2+ and symmetrical  Pulmonary/Chest: Effort normal and breath sounds normal. No stridor. No respiratory distress. He has no wheezes. He has no rales. He exhibits no tenderness.  Abdominal: Soft. Bowel sounds are normal. He exhibits no distension and no mass. There is no tenderness. There is no rebound and no guarding.  Genitourinary: Rectum normal, prostate normal and penis normal. Guaiac negative stool. No penile tenderness.  Musculoskeletal: Normal range of motion. He exhibits no edema or tenderness.  Lymphadenopathy:    He has no cervical adenopathy.  Neurological: He is alert and oriented to person, place, and time. He has normal reflexes. No cranial nerve deficit. He exhibits normal muscle tone.  Skin: Skin is warm and dry. No rash noted. No erythema. No pallor.  Total body skin exam normal except for scar on his nose from previous Mohs surgery  Psychiatric: He has a normal mood and affect. His behavior is normal. Judgment and thought content normal.  Nursing note and vitals reviewed.         Assessment & Plan:  Healthy male  History of hyperlipidemia........ continue Lipitor and aspirin  Cerumen impaction right ear.......Marland Kitchen removed by irrigation  History of skin cancer,,,,,,, continue sunscreens followed by dermatology

## 2015-09-10 NOTE — Patient Instructions (Signed)
Continue Lipitor and aspirin one of each daily at bedtime  Follow-up in one year sooner if any problems  When you call in September for your physical evaluation......... Tommi Rumps or [place]........ are 2 new adult nurse practitioner's or Dr. Martinique

## 2015-09-10 NOTE — Progress Notes (Signed)
Pre visit review using our clinic review tool, if applicable. No additional management support is needed unless otherwise documented below in the visit note. 

## 2015-09-19 DIAGNOSIS — M9903 Segmental and somatic dysfunction of lumbar region: Secondary | ICD-10-CM | POA: Diagnosis not present

## 2015-09-19 DIAGNOSIS — M545 Low back pain: Secondary | ICD-10-CM | POA: Diagnosis not present

## 2015-09-28 ENCOUNTER — Other Ambulatory Visit: Payer: Self-pay | Admitting: Family Medicine

## 2015-09-28 ENCOUNTER — Telehealth: Payer: Self-pay | Admitting: Family Medicine

## 2015-09-28 NOTE — Telephone Encounter (Signed)
error 

## 2015-10-09 DIAGNOSIS — R358 Other polyuria: Secondary | ICD-10-CM | POA: Diagnosis not present

## 2015-10-09 DIAGNOSIS — N138 Other obstructive and reflux uropathy: Secondary | ICD-10-CM | POA: Diagnosis not present

## 2015-10-09 DIAGNOSIS — E291 Testicular hypofunction: Secondary | ICD-10-CM | POA: Diagnosis not present

## 2015-10-09 DIAGNOSIS — N401 Enlarged prostate with lower urinary tract symptoms: Secondary | ICD-10-CM | POA: Diagnosis not present

## 2015-10-09 DIAGNOSIS — T387X5D Adverse effect of androgens and anabolic congeners, subsequent encounter: Secondary | ICD-10-CM | POA: Diagnosis not present

## 2015-10-17 DIAGNOSIS — M7741 Metatarsalgia, right foot: Secondary | ICD-10-CM | POA: Diagnosis not present

## 2015-12-28 ENCOUNTER — Other Ambulatory Visit: Payer: Self-pay

## 2015-12-28 MED ORDER — ATORVASTATIN CALCIUM 40 MG PO TABS: 40.0000 mg | ORAL_TABLET | Freq: Every day | ORAL | Status: DC

## 2016-01-22 DIAGNOSIS — R358 Other polyuria: Secondary | ICD-10-CM | POA: Diagnosis not present

## 2016-01-22 DIAGNOSIS — T387X5D Adverse effect of androgens and anabolic congeners, subsequent encounter: Secondary | ICD-10-CM | POA: Diagnosis not present

## 2016-01-22 DIAGNOSIS — R972 Elevated prostate specific antigen [PSA]: Secondary | ICD-10-CM | POA: Diagnosis not present

## 2016-01-22 DIAGNOSIS — E291 Testicular hypofunction: Secondary | ICD-10-CM | POA: Diagnosis not present

## 2016-03-10 ENCOUNTER — Ambulatory Visit: Payer: Medicare Other | Admitting: Adult Health

## 2016-03-12 ENCOUNTER — Encounter: Payer: Self-pay | Admitting: Adult Health

## 2016-03-12 ENCOUNTER — Ambulatory Visit (INDEPENDENT_AMBULATORY_CARE_PROVIDER_SITE_OTHER): Payer: Medicare Other | Admitting: Adult Health

## 2016-03-12 VITALS — BP 140/80 | Temp 98.0°F | Ht 70.75 in | Wt 234.2 lb

## 2016-03-12 DIAGNOSIS — H8110 Benign paroxysmal vertigo, unspecified ear: Secondary | ICD-10-CM | POA: Diagnosis not present

## 2016-03-12 DIAGNOSIS — Z7689 Persons encountering health services in other specified circumstances: Secondary | ICD-10-CM

## 2016-03-12 DIAGNOSIS — E785 Hyperlipidemia, unspecified: Secondary | ICD-10-CM

## 2016-03-12 NOTE — Progress Notes (Signed)
Patient presents to clinic today to establish care.He is a pleasant 75 year old male who  has a past medical history of Allergy; Asthma; COLONIC POLYPS, HX OF (03/12/2007); DISC DISEASE, LUMBAR (12/03/2008); DIVERTICULOSIS, COLON (10/17/2009); HYPERLIPIDEMIA (03/12/2007); MORTON'S NEUROMA, RIGHT (03/13/2008); SKIN CANCER, HX OF (10/17/2009); and VERTIGO, BENIGN PAROXYSMAL POSITION (04/28/2007).   His last physical was Jan 2017 with MD Sherren Mocha  Acute Concerns: Establish Care  Chronic Issues: Hyperlipidemia - has been taking Lipitor for " 20-25 years". Has no issues with this medication  Vertigo - Feels as though this is well controlled. Has not had any issues with this " for sometime".   Health Maintenance: Dental -- Twice a year  Vision -- Yearly  Immunizations --UTD  Colonoscopy --2015 - every 5 years Diet: Tries to eat healthy Exercise: Stays active, plays golfs, lifts weights.   Is followed by Urology     Past Medical History:  Diagnosis Date  . Allergy    SEASONAL  . Asthma   . COLONIC POLYPS, HX OF 03/12/2007  . [place] DISEASE, LUMBAR 12/03/2008  . DIVERTICULOSIS, COLON 10/17/2009  . HYPERLIPIDEMIA 03/12/2007  . [place], RIGHT 03/13/2008  . SKIN CANCER, HX OF 10/17/2009  . VERTIGO, BENIGN PAROXYSMAL POSITION 04/28/2007    Past Surgical History:  Procedure Laterality Date  . COLONOSCOPY W/ BIOPSIES    . SPINE SURGERY     lumbar disk surgery    Current Outpatient Prescriptions on File Prior to Visit  Medication Sig Dispense Refill  . aspirin 81 MG tablet Take 81 mg by mouth daily.      Marland Kitchen atorvastatin (LIPITOR) 40 MG tablet Take 1 tablet (40 mg total) by mouth daily. 100 tablet 3  . Multiple Vitamins-Minerals (MENS MULTI VITAMIN & MINERAL PO) Take by mouth daily.       No current facility-administered medications on file prior to visit.     No Known Allergies  Family History  Problem Relation Age of Onset  . Lung cancer Mother   . COPD Father   . Alcohol abuse  Brother   . Lung cancer Brother     smoker    Social History   Social History  . Marital status: Married    Spouse name: N/A  . Number of children: 3  . Years of education: N/A   Occupational History  . Retired    Social History Main Topics  . Smoking status: Former Smoker    Packs/day: 1.00    Years: 20.00    Types: Cigarettes    Quit date: 10/30/1978  . Smokeless tobacco: Never Used  . Alcohol use Yes  . Drug use: No  . Sexual activity: Not on file   Other Topics Concern  . Not on file   Social History Narrative  . No narrative on file    Review of Systems  Constitutional: Negative.   Eyes: Negative.   Respiratory: Negative.   Cardiovascular: Negative.   Genitourinary: Negative.   Neurological: Negative.   All other systems reviewed and are negative.   BP (!) 170/64   Temp 98 F (36.7 C) (Oral)   Ht 5' 10.75" (1.797 m)   Wt 234 lb 3.2 oz (106.2 kg)   BMI 32.90 kg/m   Physical Exam  Constitutional: He is oriented to person, place, and time and well-developed, well-nourished, and in no distress. No distress.  HENT:  Head: Normocephalic and atraumatic.  Right Ear: External ear normal.  Left Ear: External ear normal.  Nose:  Nose normal.  Mouth/Throat: Oropharynx is clear and moist. No oropharyngeal exudate.  Cardiovascular: Normal rate, regular rhythm, normal heart sounds and intact distal pulses.  Exam reveals no gallop and no friction rub.   No murmur heard. Pulmonary/Chest: Effort normal and breath sounds normal. No respiratory distress. He has no wheezes. He has no rales. He exhibits no tenderness.  Musculoskeletal: Normal range of motion. He exhibits no edema, tenderness or deformity.  Neurological: He is alert and oriented to person, place, and time. He has normal reflexes. Gait normal. GCS score is 15.  Skin: Skin is warm and dry. No rash noted. He is not diaphoretic. No erythema. No pallor.  Scar on nose from Mohs surgery Birthmark on right  forearm   Psychiatric: Mood, memory, affect and judgment normal.  Nursing note and vitals reviewed.  Assessment/Plan: 1. Encounter to establish care - Follow up in February for CPE - Advised heart healthy diet and continued exercise regimen.   2. Hyperlipidemia - Controlled with Lipitor - Will continue to monitor - Advised heart healthy diet and continued aerobic exercise  3. Benign paroxysmal positional vertigo, unspecified laterality - Controlled. No issues  Dorothyann Peng, NP

## 2016-05-06 DIAGNOSIS — N401 Enlarged prostate with lower urinary tract symptoms: Secondary | ICD-10-CM | POA: Diagnosis not present

## 2016-05-06 DIAGNOSIS — N138 Other obstructive and reflux uropathy: Secondary | ICD-10-CM | POA: Diagnosis not present

## 2016-05-13 ENCOUNTER — Ambulatory Visit (INDEPENDENT_AMBULATORY_CARE_PROVIDER_SITE_OTHER): Payer: Medicare Other | Admitting: Adult Health

## 2016-05-13 ENCOUNTER — Encounter: Payer: Self-pay | Admitting: Adult Health

## 2016-05-13 VITALS — BP 126/80 | Temp 97.9°F | Ht 70.75 in | Wt 234.6 lb

## 2016-05-13 DIAGNOSIS — M7712 Lateral epicondylitis, left elbow: Secondary | ICD-10-CM

## 2016-05-13 MED ORDER — NAPROXEN 500 MG PO TABS: 500.0000 mg | ORAL_TABLET | Freq: Two times a day (BID) | ORAL | 0 refills | Status: DC

## 2016-05-13 NOTE — Progress Notes (Signed)
Subjective:    Patient ID: SUAN LINSEY, male    DOB: Sep 13, 1940, 75 y.o.   MRN: [id number]  HPI  75 year old male who presents to the office the 5 days of left elbow pain that he reports happened after he was working in the yard. The pain is described as " a tightness" around his left elbow and is worth with movement such as making a fist or bicep. He has been using Aleve and Motrin which he reports does not help with the discomfort. Denies any redness, warmth or discharge.    Review of Systems  Constitutional: Negative.   Respiratory: Negative.   Cardiovascular: Negative.   Musculoskeletal: Positive for arthralgias. Negative for joint swelling and myalgias.  Skin: Negative.   All other systems reviewed and are negative.  Past Medical History:  Diagnosis Date  . Allergy    SEASONAL  . Asthma   . COLONIC POLYPS, HX OF 03/12/2007  . [place] DISEASE, LUMBAR 12/03/2008  . DIVERTICULOSIS, COLON 10/17/2009  . Hydrocele   . HYPERLIPIDEMIA 03/12/2007  . [place], RIGHT 03/13/2008  . SKIN CANCER, HX OF 10/17/2009  . VERTIGO, BENIGN PAROXYSMAL POSITION 04/28/2007    Social History   Social History  . Marital status: Married    Spouse name: N/A  . Number of children: 3  . Years of education: N/A   Occupational History  . Retired    Social History Main Topics  . Smoking status: Former Smoker    Packs/day: 1.00    Years: 20.00    Types: Cigarettes    Quit date: 10/30/1978  . Smokeless tobacco: Never Used  . Alcohol use Yes  . Drug use: No  . Sexual activity: Not on file   Other Topics Concern  . Not on file   Social History Narrative   Retired   Two daughters   Married        Past Surgical History:  Procedure Laterality Date  . COLONOSCOPY W/ BIOPSIES    . MOHS SURGERY    . SPINE SURGERY     lumbar disk surgery    Family History  Problem Relation Age of Onset  . Lung cancer Mother   . COPD Father   . Heart attack Father   . Alcohol abuse Brother   . Lung  cancer Brother     smoker    No Known Allergies  Current Outpatient Prescriptions on File Prior to Visit  Medication Sig Dispense Refill  . aspirin 81 MG tablet Take 81 mg by mouth daily.      Marland Kitchen atorvastatin (LIPITOR) 40 MG tablet Take 1 tablet (40 mg total) by mouth daily. 100 tablet 3  . Multiple Vitamins-Minerals (MENS MULTI VITAMIN & MINERAL PO) Take by mouth daily.       No current facility-administered medications on file prior to visit.     BP 126/80   Temp 97.9 F (36.6 C) (Oral)   Ht 5' 10.75" (1.797 m)   Wt 234 lb 9.6 oz (106.4 kg)   BMI 32.95 kg/m       Objective:   Physical Exam  Constitutional: He is oriented to person, place, and time. He appears well-developed and well-nourished. No distress.  Cardiovascular: Normal rate, regular rhythm, normal heart sounds and intact distal pulses.  Exam reveals no gallop and no friction rub.   No murmur heard. Pulmonary/Chest: Effort normal and breath sounds normal. No respiratory distress. He has no wheezes. He has no rales. He  exhibits no tenderness.  Musculoskeletal: Normal range of motion. He exhibits no edema, tenderness or deformity.       Left elbow: He exhibits normal range of motion, no swelling, no effusion and no deformity. No tenderness found.  No joint swelling, redness or warmth. He has full ROM.   Neurological: He is alert and oriented to person, place, and time.  Skin: Skin is warm and dry. No rash noted. He is not diaphoretic. No erythema. No pallor.  Psychiatric: He has a normal mood and affect. His behavior is normal. Judgment and thought content normal.  Nursing note and vitals reviewed.      Assessment & Plan:  1. Lateral epicondylitis of left elbow - No concern for fracture, likely ligament or tendon.  - naproxen (NAPROSYN) 500 MG tablet; Take 1 tablet (500 mg total) by mouth 2 (two) times daily with a meal.  Dispense: 30 tablet; Refill: 0 - Continue to ice, use compression, and rest.  - Follow up  as needed  Dorothyann Peng, NP

## 2016-05-13 NOTE — Patient Instructions (Addendum)
It was great seeing you again   Your exam is consistent with tennis elbow.   I have sent in a prescription for Naproxyn. Take this twice a day for the next week.   Continue to ice the elbow and rest it  Follow up as needed   Tennis Elbow Tennis elbow (lateral epicondylitis) is inflammation of the outer tendons of your forearm close to your elbow. Your tendons attach your muscles to your bones. The outer tendons of your forearm are used to extend your wrist, and they attach on the outside part of your elbow. Tennis elbow is often found in people who play tennis, but anyone may get the condition from repeatedly extending the wrist or turning the forearm. CAUSES This condition is caused by repeatedly extending your wrist and using your hands. It can result from sports or work that requires repetitive forearm movements. Tennis elbow may also be caused by an injury. RISK FACTORS You have a higher risk of developing tennis elbow if you play tennis or another racquet sport. You also have a higher risk if you frequently use your hands for work. This condition is also more likely to develop in:  Musicians.  Carpenters, painters, and plumbers.  Cooks.  Cashiers.  People who work in [employer].  Architect workers.  Butchers.  People who use computers. SYMPTOMS Symptoms of this condition include:  Pain and tenderness in your forearm and the outer part of your elbow. You may only feel the pain when you use your arm, or you may feel it even when you are not using your arm.  A burning feeling that runs from your elbow through your arm.  Weak grip in your hands. DIAGNOSIS  This condition may be diagnosed by medical history and physical exam. You may also have other tests, including:  X-rays.  MRI. TREATMENT Your health care provider will recommend lifestyle adjustments, such as resting and icing your arm. Treatment may also include:  Medicines for inflammation. This may include  shots of cortisone if your pain continues.  Physical therapy. This may include massage or exercises.  An elbow brace. Surgery may eventually be recommended if your pain does not go away with treatment. HOME CARE INSTRUCTIONS Activity  Rest your elbow and wrist as directed by your health care provider. Try to avoid any activities that caused the problem until your health care provider says that you can do them again.  If a physical therapist teaches you exercises, do all of them as directed.  If you lift an object, lift it with your palm facing upward. This lowers the stress on your elbow. Lifestyle  If your tennis elbow is caused by sports, check your equipment and make sure that:  You are using it correctly.  It is the best fit for you.  If your tennis elbow is caused by work, take breaks frequently, if you are able. Talk with your manager about how to best perform tasks in a way that is safe.  If your tennis elbow is caused by computer use, talk with your manager about any changes that can be made to your work environment. General Instructions  If directed, apply ice to the painful area:  Put ice in a plastic bag.  Place a towel between your skin and the bag.  Leave the ice on for 20 minutes, 2-3 times per day.  Take medicines only as directed by your health care provider.  If you were given a brace, wear it as directed by  your health care provider.  Keep all follow-up visits as directed by your health care provider. This is important. SEEK MEDICAL CARE IF:  Your pain does not get better with treatment.  Your pain gets worse.  You have numbness or weakness in your forearm, hand, or fingers.   This information is not intended to replace advice given to you by your health care provider. Make sure you discuss any questions you have with your health care provider.   Document Released: 07/21/2005 Document Revised: 12/05/2014 Document Reviewed: 07/17/2014 Elsevier  Interactive Patient Education [employer].

## 2016-07-01 ENCOUNTER — Ambulatory Visit (INDEPENDENT_AMBULATORY_CARE_PROVIDER_SITE_OTHER): Payer: Medicare Other

## 2016-07-01 DIAGNOSIS — Z23 Encounter for immunization: Secondary | ICD-10-CM

## 2016-07-31 ENCOUNTER — Encounter: Payer: Self-pay | Admitting: Adult Health

## 2016-07-31 ENCOUNTER — Ambulatory Visit (INDEPENDENT_AMBULATORY_CARE_PROVIDER_SITE_OTHER): Payer: Medicare Other | Admitting: Adult Health

## 2016-07-31 VITALS — BP 126/76 | Ht 70.75 in | Wt 236.4 lb

## 2016-07-31 DIAGNOSIS — M25561 Pain in right knee: Secondary | ICD-10-CM

## 2016-07-31 DIAGNOSIS — M79674 Pain in right toe(s): Secondary | ICD-10-CM

## 2016-07-31 NOTE — Progress Notes (Signed)
Subjective:    Patient ID: Daniel Hampton, male    DOB: [birth year], 75 y.o.   MRN: [id number]  HPI   75 year old male who  has a past medical history of Allergy; Asthma; COLONIC POLYPS, HX OF (03/12/2007); DISC DISEASE, LUMBAR (12/03/2008); DIVERTICULOSIS, COLON (10/17/2009); Hydrocele; HYPERLIPIDEMIA (03/12/2007); MORTON'S NEUROMA, RIGHT (03/13/2008); SKIN CANCER, HX OF (10/17/2009); and VERTIGO, BENIGN PAROXYSMAL POSITION (04/28/2007). He presents to the office today for for multiple acute issues.   1. Right great toe pain x 1-2 weeks. Pain is in the ball of his toe. Pain is worse with walking. Denies any redness or warmth   2. About three weeks ago he felt as though his right knee was " starting to stiff up." This was more apparent when getting up in the morning. 4 days ago he suffered a mechanical fall when coming down the stairs at home. He reports that he fell on the last two steps. He does not know if his knee gave out or he slipped on the woods stairs while wearing socks. He reports that he landed on his  He fell onto his left lower back, denies hitting his head. Denies any pain at this time but does report significant bruising to left flank. Denies any numbness or tingling in his lower extremities  3. Right knee pain and swelling. Report that over the course of the last two weeks he has had tenderness to his right knee. This is discomfort is worse with certain movements such as swinging a golf club. He has noticed " swelling around the knee". Denies any redness or warmth. He has been using ice packs on his knee which has helped with the swelling   Review of Systems See HPI  Past Medical History:  Diagnosis Date  . Allergy    SEASONAL  . Asthma   . COLONIC POLYPS, HX OF 03/12/2007  . [place] DISEASE, LUMBAR 12/03/2008  . DIVERTICULOSIS, COLON 10/17/2009  . Hydrocele   . HYPERLIPIDEMIA 03/12/2007  . [place], RIGHT 03/13/2008  . SKIN CANCER, HX OF 10/17/2009  . VERTIGO, BENIGN PAROXYSMAL  POSITION 04/28/2007    Social History   Social History  . Marital status: Married    Spouse name: N/A  . Number of children: 3  . Years of education: N/A   Occupational History  . Retired    Social History Main Topics  . Smoking status: Former Smoker    Packs/day: 1.00    Years: 20.00    Types: Cigarettes    Quit date: 10/30/1978  . Smokeless tobacco: Never Used  . Alcohol use Yes  . Drug use: No  . Sexual activity: Not on file   Other Topics Concern  . Not on file   Social History Narrative   Retired   Two daughters   Married        Past Surgical History:  Procedure Laterality Date  . COLONOSCOPY W/ BIOPSIES    . MOHS SURGERY    . SPINE SURGERY     lumbar disk surgery    Family History  Problem Relation Age of Onset  . Lung cancer Mother   . COPD Father   . Heart attack Father   . Alcohol abuse Brother   . Lung cancer Brother     smoker    No Known Allergies  Current Outpatient Prescriptions on File Prior to Visit  Medication Sig Dispense Refill  . aspirin 81 MG tablet Take 81 mg by mouth daily.      Marland Kitchen  atorvastatin (LIPITOR) 40 MG tablet Take 1 tablet (40 mg total) by mouth daily. 100 tablet 3  . Multiple Vitamins-Minerals (MENS MULTI VITAMIN & MINERAL PO) Take by mouth daily.      . naproxen (NAPROSYN) 500 MG tablet Take 1 tablet (500 mg total) by mouth 2 (two) times daily with a meal. 30 tablet 0   No current facility-administered medications on file prior to visit.     BP 126/76   Ht 5' 10.75" (1.797 m)   Wt 236 lb 6.4 oz (107.2 kg)   BMI 33.20 kg/m       Objective:   Physical Exam  Constitutional: He is oriented to person, place, and time. He appears well-developed and well-nourished. No distress.  Cardiovascular: Normal rate, regular rhythm and normal heart sounds.  Exam reveals no gallop and no friction rub.   No murmur heard. Pulmonary/Chest: Effort normal and breath sounds normal. No respiratory distress. He has no wheezes. He has  no rales.  Musculoskeletal: Normal range of motion. He exhibits no edema, tenderness or deformity.  No swelling, redness or warmth noted to right knee. Unable to reproduce pain with palpation.   Thorn stuck in right great toe. No signs of infection. Thorn was easily removed   Neurological: He is alert and oriented to person, place, and time. He has normal reflexes. No cranial nerve deficit. Coordination normal.  Skin: Skin is warm and dry. No rash noted. No erythema. No pallor.  Bruising noted on left flank. This bruise is in various stages of healing. No pain with palpation to back, flank, or abdomen   Psychiatric: He has a normal mood and affect. His behavior is normal. Thought content normal.  Vitals reviewed.     Assessment & Plan:  1. Great toe pain, right - Pulled thorn from right great toe. There was no signs of infection.  - Advised to follow up if redness, warmth or swelling noted.   2. Right knee pain, unspecified chronicity - No current signs of bursitis.  - Possible arthritic changes.  - We reviewed options including steroid injection. He would like to wait and will return if pain becomes worse.  - He can use compression and ice with motrin   Dorothyann Peng, NP

## 2016-08-07 DIAGNOSIS — M9903 Segmental and somatic dysfunction of lumbar region: Secondary | ICD-10-CM | POA: Diagnosis not present

## 2016-08-07 DIAGNOSIS — M5136 Other intervertebral disc degeneration, lumbar region: Secondary | ICD-10-CM | POA: Diagnosis not present

## 2016-08-07 DIAGNOSIS — M9913 Subluxation complex (vertebral) of lumbar region: Secondary | ICD-10-CM | POA: Diagnosis not present

## 2016-08-07 DIAGNOSIS — M5441 Lumbago with sciatica, right side: Secondary | ICD-10-CM | POA: Diagnosis not present

## 2016-08-26 DIAGNOSIS — L219 Seborrheic dermatitis, unspecified: Secondary | ICD-10-CM | POA: Diagnosis not present

## 2016-08-26 DIAGNOSIS — Z85828 Personal history of other malignant neoplasm of skin: Secondary | ICD-10-CM | POA: Diagnosis not present

## 2016-08-26 DIAGNOSIS — L821 Other seborrheic keratosis: Secondary | ICD-10-CM | POA: Diagnosis not present

## 2016-08-26 DIAGNOSIS — D229 Melanocytic nevi, unspecified: Secondary | ICD-10-CM | POA: Diagnosis not present

## 2016-09-04 DIAGNOSIS — M9913 Subluxation complex (vertebral) of lumbar region: Secondary | ICD-10-CM | POA: Diagnosis not present

## 2016-09-04 DIAGNOSIS — M5441 Lumbago with sciatica, right side: Secondary | ICD-10-CM | POA: Diagnosis not present

## 2016-09-04 DIAGNOSIS — M5136 Other intervertebral disc degeneration, lumbar region: Secondary | ICD-10-CM | POA: Diagnosis not present

## 2016-09-04 DIAGNOSIS — M9903 Segmental and somatic dysfunction of lumbar region: Secondary | ICD-10-CM | POA: Diagnosis not present

## 2016-09-05 ENCOUNTER — Other Ambulatory Visit (INDEPENDENT_AMBULATORY_CARE_PROVIDER_SITE_OTHER): Payer: Medicare Other

## 2016-09-05 DIAGNOSIS — R319 Hematuria, unspecified: Secondary | ICD-10-CM

## 2016-09-05 DIAGNOSIS — Z0000 Encounter for general adult medical examination without abnormal findings: Secondary | ICD-10-CM

## 2016-09-05 LAB — HEPATIC FUNCTION PANEL
ALK PHOS: 43 U/L (ref 39–117)
ALT: 31 U/L (ref 0–53)
AST: 22 U/L (ref 0–37)
Albumin: 4.4 g/dL (ref 3.5–5.2)
BILIRUBIN DIRECT: 0.2 mg/dL (ref 0.0–0.3)
BILIRUBIN TOTAL: 0.8 mg/dL (ref 0.2–1.2)
Total Protein: 6.7 g/dL (ref 6.0–8.3)

## 2016-09-05 LAB — LIPID PANEL
CHOL/HDL RATIO: 3
CHOLESTEROL: 166 mg/dL (ref 0–200)
HDL: 49.8 mg/dL (ref >39.00)
LDL CALC: 88 mg/dL (ref 0–99)
NONHDL: 116.31
Triglycerides: 144 mg/dL (ref 0.0–149.0)
VLDL: 28.8 mg/dL (ref 0.0–40.0)

## 2016-09-05 LAB — CBC WITH DIFFERENTIAL/PLATELET
BASOS ABS: 0.1 10*3/uL (ref 0.0–0.1)
Basophils Relative: 0.8 % (ref 0.0–3.0)
Eosinophils Absolute: 0.2 10*3/uL (ref 0.0–0.7)
Eosinophils Relative: 2.2 % (ref 0.0–5.0)
HCT: 43.6 % (ref 39.0–52.0)
HEMOGLOBIN: 15.1 g/dL (ref 13.0–17.0)
LYMPHS ABS: 2.6 10*3/uL (ref 0.7–4.0)
Lymphocytes Relative: 35.6 % (ref 12.0–46.0)
MCHC: 34.7 g/dL (ref 30.0–36.0)
MCV: 92.4 fl (ref 78.0–100.0)
MONO ABS: 0.5 10*3/uL (ref 0.1–1.0)
Monocytes Relative: 7.3 % (ref 3.0–12.0)
Neutro Abs: 4 10*3/uL (ref 1.4–7.7)
Neutrophils Relative %: 54.1 % (ref 43.0–77.0)
Platelets: 195 10*3/uL (ref 150.0–400.0)
RBC: 4.72 Mil/uL (ref 4.22–5.81)
RDW: 12.5 % (ref 11.5–15.5)
WBC: 7.4 10*3/uL (ref 4.0–10.5)

## 2016-09-05 LAB — URINALYSIS, MICROSCOPIC ONLY

## 2016-09-05 LAB — PSA: PSA: 4.7 ng/mL — ABNORMAL HIGH (ref 0.10–4.00)

## 2016-09-05 LAB — POC URINALSYSI DIPSTICK (AUTOMATED)
BILIRUBIN UA: NEGATIVE
GLUCOSE UA: NEGATIVE
KETONES UA: NEGATIVE
Leukocytes, UA: NEGATIVE
NITRITE UA: NEGATIVE
Protein, UA: NEGATIVE
Spec Grav, UA: 1.025
Urobilinogen, UA: 0.2
pH, UA: 5.5

## 2016-09-05 LAB — BASIC METABOLIC PANEL
BUN: 19 mg/dL (ref 6–23)
CALCIUM: 8.9 mg/dL (ref 8.4–10.5)
CO2: 30 mEq/L (ref 19–32)
Chloride: 104 mEq/L (ref 96–112)
Creatinine, Ser: 0.98 mg/dL (ref 0.40–1.50)
GFR: 95.47 mL/min (ref >60.00)
GLUCOSE: 96 mg/dL (ref 70–99)
Potassium: 4.3 mEq/L (ref 3.5–5.1)
SODIUM: 141 meq/L (ref 135–145)

## 2016-09-05 LAB — TSH: TSH: 2.66 u[IU]/mL (ref 0.35–4.50)

## 2016-09-10 ENCOUNTER — Encounter: Payer: Self-pay | Admitting: Adult Health

## 2016-09-10 ENCOUNTER — Ambulatory Visit (INDEPENDENT_AMBULATORY_CARE_PROVIDER_SITE_OTHER): Payer: Medicare Other | Admitting: Adult Health

## 2016-09-10 VITALS — BP 140/76 | Temp 98.3°F | Ht 70.75 in | Wt 237.8 lb

## 2016-09-10 DIAGNOSIS — Z0000 Encounter for general adult medical examination without abnormal findings: Secondary | ICD-10-CM

## 2016-09-10 DIAGNOSIS — E785 Hyperlipidemia, unspecified: Secondary | ICD-10-CM | POA: Diagnosis not present

## 2016-09-10 NOTE — Progress Notes (Signed)
Subjective:    Patient ID: Daniel Hampton, male    DOB: July 09, 1941, 76 y.o.   MRN: [id number]  HPI  Patient presents for yearly preventative medicine examination. He is a pleasant 76 year old male who  has a past medical history of Allergy; Asthma; COLONIC POLYPS, HX OF (03/12/2007); DISC DISEASE, LUMBAR (12/03/2008); DIVERTICULOSIS, COLON (10/17/2009); Hydrocele; HYPERLIPIDEMIA (03/12/2007); MORTON'S NEUROMA, RIGHT (03/13/2008); SKIN CANCER, HX OF (10/17/2009); and VERTIGO, BENIGN PAROXYSMAL POSITION (04/28/2007).   All immunizations and health maintenance protocols were reviewed with the patient and needed orders were placed.  Medication reconciliation,  past medical history, social history, problem list and allergies were reviewed in detail with the patient  Goals were established with regard to weight loss, exercise, and  diet in compliance with medications. He tries to eat healthy and has stays active with playing golf and lifts weights.   End of life planning was discussed.  He takes Lipitor 40 mg and ASA 81 mg for hyperlipidemia.   He continues to have have left elbow pain and right knee pain. He has been doing acupuncture therapy for his left elbow and has been finding this helpful.   He is up to date on colonoscopy and vaccinations. He does routine dental and vision care.   He sees urology on a yearly basis as well as dermatology     Review of Systems  Constitutional: Negative.   HENT: Negative.   Eyes: Negative.   Respiratory: Negative.   Cardiovascular: Negative.   Gastrointestinal: Negative.   Endocrine: Negative.   Genitourinary: Negative.   Musculoskeletal: Positive for arthralgias. Negative for gait problem, neck pain and neck stiffness.  Skin: Negative.   Allergic/Immunologic: Negative.   Neurological: Negative.   Hematological: Negative.   Psychiatric/Behavioral: Negative.   All other systems reviewed and are negative.  Past Medical History:  Diagnosis Date  .  Allergy    SEASONAL  . Asthma   . COLONIC POLYPS, HX OF 03/12/2007  . [place] DISEASE, LUMBAR 12/03/2008  . DIVERTICULOSIS, COLON 10/17/2009  . Hydrocele   . HYPERLIPIDEMIA 03/12/2007  . [place], RIGHT 03/13/2008  . SKIN CANCER, HX OF 10/17/2009  . VERTIGO, BENIGN PAROXYSMAL POSITION 04/28/2007    Social History   Social History  . Marital status: Married    Spouse name: N/A  . Number of children: 3  . Years of education: N/A   Occupational History  . Retired    Social History Main Topics  . Smoking status: Former Smoker    Packs/day: 1.00    Years: 20.00    Types: Cigarettes    Quit date: 10/30/1978  . Smokeless tobacco: Never Used  . Alcohol use Yes  . Drug use: No  . Sexual activity: Not on file   Other Topics Concern  . Not on file   Social History Narrative   Retired   Two daughters   Married        Past Surgical History:  Procedure Laterality Date  . COLONOSCOPY W/ BIOPSIES    . MOHS SURGERY    . SPINE SURGERY     lumbar disk surgery    Family History  Problem Relation Age of Onset  . Lung cancer Mother   . COPD Father   . Heart attack Father   . Alcohol abuse Brother   . Lung cancer Brother     smoker    No Known Allergies  Current Outpatient Prescriptions on File Prior to Visit  Medication Sig Dispense Refill  .  aspirin 81 MG tablet Take 81 mg by mouth daily.      Marland Kitchen atorvastatin (LIPITOR) 40 MG tablet Take 1 tablet (40 mg total) by mouth daily. 100 tablet 3  . Multiple Vitamins-Minerals (MENS MULTI VITAMIN & MINERAL PO) Take by mouth daily.      . naproxen (NAPROSYN) 500 MG tablet Take 1 tablet (500 mg total) by mouth 2 (two) times daily with a meal. 30 tablet 0   No current facility-administered medications on file prior to visit.     BP 140/76   Temp 98.3 F (36.8 C) (Oral)   Ht 5' 10.75" (1.797 m)   Wt 237 lb 12.8 oz (107.9 kg)   BMI 33.40 kg/m       Objective:   Physical Exam  Constitutional: He is oriented to person,  place, and time. He appears well-developed and well-nourished. No distress.  HENT:  Head: Normocephalic and atraumatic.  Right Ear: External ear normal.  Left Ear: External ear normal.  Nose: Nose normal.  Mouth/Throat: Oropharynx is clear and moist. No oropharyngeal exudate.  Eyes: Conjunctivae and EOM are normal. Pupils are equal, round, and reactive to light. Right eye exhibits no discharge. Left eye exhibits no discharge. No scleral icterus.  Neck: Normal range of motion. Neck supple. No JVD present. No tracheal deviation present. No thyromegaly present.  Cardiovascular: Normal rate, regular rhythm, normal heart sounds and intact distal pulses.  Exam reveals no gallop and no friction rub.   No murmur heard. Pulmonary/Chest: Effort normal and breath sounds normal. No stridor. No respiratory distress. He has no wheezes. He has no rales. He exhibits no tenderness.  Abdominal: Soft. Bowel sounds are normal. He exhibits no distension and no mass. There is no tenderness. There is no rebound and no guarding.  Musculoskeletal: Normal range of motion. He exhibits no edema, tenderness or deformity.  Lymphadenopathy:    He has no cervical adenopathy.  Neurological: He is alert and oriented to person, place, and time. He has normal reflexes. He displays normal reflexes. No cranial nerve deficit. He exhibits normal muscle tone. Coordination normal.  Skin: Skin is warm and dry. No rash noted. He is not diaphoretic. No erythema. No pallor.  Psychiatric: He has a normal mood and affect. His behavior is normal. Judgment and thought content normal.  Nursing note and vitals reviewed.     Assessment & Plan:  1. Routine general medical examination at a health care facility - reviewed labs in detail with patient and all questions answered. He his going to go see his Urologist due to slightly elevated PSA.  - Follow up in one year for CPE - Work on losing weight   2. Hyperlipidemia, unspecified  hyperlipidemia type -  Lab Results  Component Value Date   CHOL 166 09/05/2016   HDL 49.80 09/05/2016   LDLCALC 88 09/05/2016   LDLDIRECT 194.7 04/14/2012   TRIG 144.0 09/05/2016   CHOLHDL 3 09/05/2016   - Continue with current dose of lipitor.  - Work on diet and exercise  Dorothyann Peng, NP

## 2016-09-16 DIAGNOSIS — M5441 Lumbago with sciatica, right side: Secondary | ICD-10-CM | POA: Diagnosis not present

## 2016-09-16 DIAGNOSIS — M9913 Subluxation complex (vertebral) of lumbar region: Secondary | ICD-10-CM | POA: Diagnosis not present

## 2016-09-16 DIAGNOSIS — M5136 Other intervertebral disc degeneration, lumbar region: Secondary | ICD-10-CM | POA: Diagnosis not present

## 2016-09-16 DIAGNOSIS — M9903 Segmental and somatic dysfunction of lumbar region: Secondary | ICD-10-CM | POA: Diagnosis not present

## 2016-09-18 DIAGNOSIS — M5136 Other intervertebral disc degeneration, lumbar region: Secondary | ICD-10-CM | POA: Diagnosis not present

## 2016-09-18 DIAGNOSIS — M9913 Subluxation complex (vertebral) of lumbar region: Secondary | ICD-10-CM | POA: Diagnosis not present

## 2016-09-18 DIAGNOSIS — M5441 Lumbago with sciatica, right side: Secondary | ICD-10-CM | POA: Diagnosis not present

## 2016-09-18 DIAGNOSIS — M9903 Segmental and somatic dysfunction of lumbar region: Secondary | ICD-10-CM | POA: Diagnosis not present

## 2016-09-23 DIAGNOSIS — M5441 Lumbago with sciatica, right side: Secondary | ICD-10-CM | POA: Diagnosis not present

## 2016-09-23 DIAGNOSIS — M9913 Subluxation complex (vertebral) of lumbar region: Secondary | ICD-10-CM | POA: Diagnosis not present

## 2016-09-23 DIAGNOSIS — M5136 Other intervertebral disc degeneration, lumbar region: Secondary | ICD-10-CM | POA: Diagnosis not present

## 2016-09-23 DIAGNOSIS — M9903 Segmental and somatic dysfunction of lumbar region: Secondary | ICD-10-CM | POA: Diagnosis not present

## 2016-09-25 DIAGNOSIS — M5441 Lumbago with sciatica, right side: Secondary | ICD-10-CM | POA: Diagnosis not present

## 2016-09-25 DIAGNOSIS — M9903 Segmental and somatic dysfunction of lumbar region: Secondary | ICD-10-CM | POA: Diagnosis not present

## 2016-09-25 DIAGNOSIS — M9913 Subluxation complex (vertebral) of lumbar region: Secondary | ICD-10-CM | POA: Diagnosis not present

## 2016-09-25 DIAGNOSIS — M5136 Other intervertebral disc degeneration, lumbar region: Secondary | ICD-10-CM | POA: Diagnosis not present

## 2016-09-29 DIAGNOSIS — M9903 Segmental and somatic dysfunction of lumbar region: Secondary | ICD-10-CM | POA: Diagnosis not present

## 2016-09-29 DIAGNOSIS — M5136 Other intervertebral disc degeneration, lumbar region: Secondary | ICD-10-CM | POA: Diagnosis not present

## 2016-09-29 DIAGNOSIS — M9913 Subluxation complex (vertebral) of lumbar region: Secondary | ICD-10-CM | POA: Diagnosis not present

## 2016-09-29 DIAGNOSIS — M5441 Lumbago with sciatica, right side: Secondary | ICD-10-CM | POA: Diagnosis not present

## 2016-10-06 DIAGNOSIS — M5441 Lumbago with sciatica, right side: Secondary | ICD-10-CM | POA: Diagnosis not present

## 2016-10-06 DIAGNOSIS — M9903 Segmental and somatic dysfunction of lumbar region: Secondary | ICD-10-CM | POA: Diagnosis not present

## 2016-10-06 DIAGNOSIS — M5136 Other intervertebral disc degeneration, lumbar region: Secondary | ICD-10-CM | POA: Diagnosis not present

## 2016-10-06 DIAGNOSIS — M9913 Subluxation complex (vertebral) of lumbar region: Secondary | ICD-10-CM | POA: Diagnosis not present

## 2016-10-07 ENCOUNTER — Ambulatory Visit (INDEPENDENT_AMBULATORY_CARE_PROVIDER_SITE_OTHER): Payer: Medicare Other | Admitting: Adult Health

## 2016-10-07 ENCOUNTER — Encounter: Payer: Self-pay | Admitting: Adult Health

## 2016-10-07 VITALS — BP 138/74 | Temp 98.1°F | Ht 70.75 in | Wt 238.1 lb

## 2016-10-07 DIAGNOSIS — J069 Acute upper respiratory infection, unspecified: Secondary | ICD-10-CM | POA: Diagnosis not present

## 2016-10-07 MED ORDER — HYDROCODONE-HOMATROPINE 5-1.5 MG/5ML PO SYRP: 5.0000 mL | ORAL_SOLUTION | Freq: Three times a day (TID) | ORAL | 0 refills | Status: DC | PRN

## 2016-10-07 MED ORDER — PREDNISONE 10 MG PO TABS: ORAL_TABLET | ORAL | 0 refills | Status: DC

## 2016-10-07 NOTE — Progress Notes (Signed)
Subjective:    Patient ID: Daniel Hampton, male    DOB: [birth year], 76 y.o.   MRN: [id number]  URI   This is a new problem. The current episode started 1 to 4 weeks ago (2 weeks ). The problem has been unchanged. There has been no fever. Associated symptoms include congestion, coughing and a sore throat (sore throat ). Pertinent negatives include no ear pain, headaches, plugged ear sensation, rhinorrhea or swollen glands. He has tried decongestant (hydromet ) for the symptoms. The treatment provided no relief.    Review of Systems  Constitutional: Negative.   HENT: Positive for congestion, drooling and sore throat (sore throat ). Negative for ear pain, rhinorrhea and trouble swallowing.   Respiratory: Positive for cough. Negative for chest tightness.   Cardiovascular: Negative.   Neurological: Negative for headaches.  All other systems reviewed and are negative.  Past Medical History:  Diagnosis Date  . Allergy    SEASONAL  . Asthma   . COLONIC POLYPS, HX OF 03/12/2007  . [place] DISEASE, LUMBAR 12/03/2008  . DIVERTICULOSIS, COLON 10/17/2009  . Hydrocele   . HYPERLIPIDEMIA 03/12/2007  . [place], RIGHT 03/13/2008  . SKIN CANCER, HX OF 10/17/2009  . VERTIGO, BENIGN PAROXYSMAL POSITION 04/28/2007    Social History   Social History  . Marital status: Married    Spouse name: N/A  . Number of children: 3  . Years of education: N/A   Occupational History  . Retired    Social History Main Topics  . Smoking status: Former Smoker    Packs/day: 1.00    Years: 20.00    Types: Cigarettes    Quit date: 10/30/1978  . Smokeless tobacco: Never Used  . Alcohol use Yes  . Drug use: No  . Sexual activity: Not on file   Other Topics Concern  . Not on file   Social History Narrative   Retired   Two daughters   Married        Past Surgical History:  Procedure Laterality Date  . COLONOSCOPY W/ BIOPSIES    . MOHS SURGERY    . SPINE SURGERY     lumbar disk surgery    Family  History  Problem Relation Age of Onset  . Lung cancer Mother   . COPD Father   . Heart attack Father   . Alcohol abuse Brother   . Lung cancer Brother     smoker    No Known Allergies  Current Outpatient Prescriptions on File Prior to Visit  Medication Sig Dispense Refill  . aspirin 81 MG tablet Take 81 mg by mouth daily.      Marland Kitchen atorvastatin (LIPITOR) 40 MG tablet Take 1 tablet (40 mg total) by mouth daily. 100 tablet 3  . Multiple Vitamins-Minerals (MENS MULTI VITAMIN & MINERAL PO) Take by mouth daily.      . naproxen (NAPROSYN) 500 MG tablet Take 1 tablet (500 mg total) by mouth 2 (two) times daily with a meal. 30 tablet 0   No current facility-administered medications on file prior to visit.     BP 138/74   Temp 98.1 F (36.7 C) (Oral)   Ht 5' 10.75" (1.797 m)   Wt 238 lb 1.6 oz (108 kg)   BMI 33.44 kg/m       Objective:   Physical Exam  Constitutional: He is oriented to person, place, and time. He appears well-developed and well-nourished. No distress.  HENT:  Head: Normocephalic and atraumatic.  Right  Ear: External ear normal.  Left Ear: External ear normal.  Nose: Nose normal.  Mouth/Throat: Oropharynx is clear and moist. No oropharyngeal exudate.  Eyes: Conjunctivae and EOM are normal. Pupils are equal, round, and reactive to light. Right eye exhibits no discharge. Left eye exhibits no discharge. No scleral icterus.  Neck: Normal range of motion. Neck supple. No thyromegaly present.  Cardiovascular: Normal rate, regular rhythm, normal heart sounds and intact distal pulses.  Exam reveals no gallop and no friction rub.   No murmur heard. Pulmonary/Chest: Effort normal and breath sounds normal. No respiratory distress. He has no wheezes. He has no rales. He exhibits no tenderness.  Dry cough    Musculoskeletal: Normal range of motion. He exhibits no edema, tenderness or deformity.  Lymphadenopathy:    He has no cervical adenopathy.  Neurological: He is alert and  oriented to person, place, and time.  Skin: Skin is warm and dry. No rash noted. He is not diaphoretic. No erythema. No pallor.  Psychiatric: He has a normal mood and affect. His behavior is normal. Judgment and thought content normal.  Nursing note and vitals reviewed.     Assessment & Plan:  1. Upper respiratory tract infection, unspecified type - Appears to be viral. Will treat symptoms due to length  - HYDROcodone-homatropine (HYCODAN) 5-1.5 MG/5ML syrup; Take 5 mLs by mouth every 8 (eight) hours as needed for cough.  Dispense: 120 mL; Refill: 0 - predniSONE (DELTASONE) 10 MG tablet; 40 mg x 3 days, 20 mg x 3 days, 10 mg x 3 days  Dispense: 21 tablet; Refill: 0 - Follow up if no improvement or if fever develops.    Dorothyann Peng, NP

## 2016-10-13 DIAGNOSIS — M5136 Other intervertebral disc degeneration, lumbar region: Secondary | ICD-10-CM | POA: Diagnosis not present

## 2016-10-13 DIAGNOSIS — M5441 Lumbago with sciatica, right side: Secondary | ICD-10-CM | POA: Diagnosis not present

## 2016-10-13 DIAGNOSIS — M9903 Segmental and somatic dysfunction of lumbar region: Secondary | ICD-10-CM | POA: Diagnosis not present

## 2016-10-13 DIAGNOSIS — M9913 Subluxation complex (vertebral) of lumbar region: Secondary | ICD-10-CM | POA: Diagnosis not present

## 2016-10-20 DIAGNOSIS — M5441 Lumbago with sciatica, right side: Secondary | ICD-10-CM | POA: Diagnosis not present

## 2016-10-20 DIAGNOSIS — M9913 Subluxation complex (vertebral) of lumbar region: Secondary | ICD-10-CM | POA: Diagnosis not present

## 2016-10-20 DIAGNOSIS — M9903 Segmental and somatic dysfunction of lumbar region: Secondary | ICD-10-CM | POA: Diagnosis not present

## 2016-10-20 DIAGNOSIS — M5136 Other intervertebral disc degeneration, lumbar region: Secondary | ICD-10-CM | POA: Diagnosis not present

## 2016-10-27 DIAGNOSIS — M5441 Lumbago with sciatica, right side: Secondary | ICD-10-CM | POA: Diagnosis not present

## 2016-10-27 DIAGNOSIS — M9913 Subluxation complex (vertebral) of lumbar region: Secondary | ICD-10-CM | POA: Diagnosis not present

## 2016-10-27 DIAGNOSIS — M9903 Segmental and somatic dysfunction of lumbar region: Secondary | ICD-10-CM | POA: Diagnosis not present

## 2016-10-27 DIAGNOSIS — M5136 Other intervertebral disc degeneration, lumbar region: Secondary | ICD-10-CM | POA: Diagnosis not present

## 2016-11-10 DIAGNOSIS — N138 Other obstructive and reflux uropathy: Secondary | ICD-10-CM | POA: Diagnosis not present

## 2016-11-10 DIAGNOSIS — N401 Enlarged prostate with lower urinary tract symptoms: Secondary | ICD-10-CM | POA: Diagnosis not present

## 2016-12-01 DIAGNOSIS — M9903 Segmental and somatic dysfunction of lumbar region: Secondary | ICD-10-CM | POA: Diagnosis not present

## 2016-12-01 DIAGNOSIS — M9913 Subluxation complex (vertebral) of lumbar region: Secondary | ICD-10-CM | POA: Diagnosis not present

## 2016-12-01 DIAGNOSIS — M5441 Lumbago with sciatica, right side: Secondary | ICD-10-CM | POA: Diagnosis not present

## 2016-12-01 DIAGNOSIS — M5136 Other intervertebral disc degeneration, lumbar region: Secondary | ICD-10-CM | POA: Diagnosis not present

## 2016-12-12 ENCOUNTER — Ambulatory Visit (INDEPENDENT_AMBULATORY_CARE_PROVIDER_SITE_OTHER): Payer: Medicare Other | Admitting: Adult Health

## 2016-12-12 ENCOUNTER — Encounter: Payer: Self-pay | Admitting: Adult Health

## 2016-12-12 VITALS — BP 138/80 | Temp 98.2°F | Ht 70.75 in | Wt 237.8 lb

## 2016-12-12 DIAGNOSIS — G8929 Other chronic pain: Secondary | ICD-10-CM

## 2016-12-12 DIAGNOSIS — M25561 Pain in right knee: Secondary | ICD-10-CM | POA: Diagnosis not present

## 2016-12-12 MED ORDER — METHYLPREDNISOLONE ACETATE 80 MG/ML IJ SUSP
80.0000 mg | Freq: Once | INTRAMUSCULAR | Status: AC
  Administered 2016-12-12: 80 mg via INTRA_ARTICULAR

## 2016-12-12 NOTE — Progress Notes (Signed)
Subjective:    Patient ID: Daniel Hampton, male    DOB: [birth year], 76 y.o.   MRN: [id number]  HPI  76 year old male who  has a past medical history of Allergy; Asthma; COLONIC POLYPS, HX OF (03/12/2007); DISC DISEASE, LUMBAR (12/03/2008); DIVERTICULOSIS, COLON (10/17/2009); Hydrocele; HYPERLIPIDEMIA (03/12/2007); MORTON'S NEUROMA, RIGHT (03/13/2008); SKIN CANCER, HX OF (10/17/2009); and VERTIGO, BENIGN PAROXYSMAL POSITION (04/28/2007).  He presents for worsening right knee pain x 6 months. The pain is worse with movements such as his golf swing. The pain is described as dull and aching and feels as though " it is coming from inside the knee."   Denies any trauma. He has not noticed any bruising.   Review of Systems See HPI   Past Medical History:  Diagnosis Date  . Allergy    SEASONAL  . Asthma   . COLONIC POLYPS, HX OF 03/12/2007  . [place] DISEASE, LUMBAR 12/03/2008  . DIVERTICULOSIS, COLON 10/17/2009  . Hydrocele   . HYPERLIPIDEMIA 03/12/2007  . [place], RIGHT 03/13/2008  . SKIN CANCER, HX OF 10/17/2009  . VERTIGO, BENIGN PAROXYSMAL POSITION 04/28/2007    Social History   Social History  . Marital status: Married    Spouse name: N/A  . Number of children: 3  . Years of education: N/A   Occupational History  . Retired    Social History Main Topics  . Smoking status: Former Smoker    Packs/day: 1.00    Years: 20.00    Types: Cigarettes    Quit date: 10/30/1978  . Smokeless tobacco: Never Used  . Alcohol use Yes  . Drug use: No  . Sexual activity: Not on file   Other Topics Concern  . Not on file   Social History Narrative   Retired   Two daughters   Married        Past Surgical History:  Procedure Laterality Date  . COLONOSCOPY W/ BIOPSIES    . MOHS SURGERY    . SPINE SURGERY     lumbar disk surgery    Family History  Problem Relation Age of Onset  . Lung cancer Mother   . COPD Father   . Heart attack Father   . Alcohol abuse Brother   . Lung cancer  Brother        smoker    No Known Allergies  Current Outpatient Prescriptions on File Prior to Visit  Medication Sig Dispense Refill  . aspirin 81 MG tablet Take 81 mg by mouth daily.      Marland Kitchen atorvastatin (LIPITOR) 40 MG tablet Take 1 tablet (40 mg total) by mouth daily. 100 tablet 3  . Multiple Vitamins-Minerals (MENS MULTI VITAMIN & MINERAL PO) Take by mouth daily.       No current facility-administered medications on file prior to visit.     BP 138/80 (BP Location: Left Arm, Patient Position: Sitting, Cuff Size: Large)   Temp 98.2 F (36.8 C) (Oral)   Ht 5' 10.75" (1.797 m)   Wt 237 lb 12.8 oz (107.9 kg)   BMI 33.40 kg/m       Objective:   Physical Exam  Constitutional: He is oriented to person, place, and time. He appears well-developed and well-nourished. No distress.  Cardiovascular: Normal rate, regular rhythm, normal heart sounds and intact distal pulses.  Exam reveals no gallop and no friction rub.   No murmur heard. Pulmonary/Chest: Effort normal and breath sounds normal. No respiratory distress. He has no wheezes. He  has no rales. He exhibits no tenderness.  Musculoskeletal: He exhibits no edema, tenderness or deformity.  Normal straight leg raise Internal rotation produced no pain  Pain with external rotation.  Mild crepitus   Neurological: He is alert and oriented to person, place, and time.  Skin: Skin is warm and dry. No rash noted. He is not diaphoretic. No erythema. No pallor.  Psychiatric: He has a normal mood and affect. His behavior is normal. Judgment and thought content normal.  Nursing note and vitals reviewed.     Assessment & Plan:  1. Chronic pain of right knee - We discussed various treatment options and he decided to opt for cortisone knee injection.   Right knee injection Verbal consent obtained and verified. Sterile betadine prep. Furthur cleansed with alcohol. Topical analgesic spray: Ethyl chloride. Joint: right knee  Approached in  typical fashion with: Anterolateral approach Completed without difficulty Meds: 3 cc lidocaine 2% no epi, 1 cc depomedrol 80mg /cc Needle:1.5 inch 25 gauge Aftercare instructions and Red flags advised. - methylPREDNISolone acetate (DEPO-MEDROL) injection 80 mg; Inject 1 mL (80 mg total) into the articular space once.  Dorothyann Peng, NP

## 2016-12-22 DIAGNOSIS — M9903 Segmental and somatic dysfunction of lumbar region: Secondary | ICD-10-CM | POA: Diagnosis not present

## 2016-12-22 DIAGNOSIS — M5441 Lumbago with sciatica, right side: Secondary | ICD-10-CM | POA: Diagnosis not present

## 2016-12-22 DIAGNOSIS — M5136 Other intervertebral disc degeneration, lumbar region: Secondary | ICD-10-CM | POA: Diagnosis not present

## 2016-12-22 DIAGNOSIS — M9913 Subluxation complex (vertebral) of lumbar region: Secondary | ICD-10-CM | POA: Diagnosis not present

## 2017-01-14 DIAGNOSIS — H524 Presbyopia: Secondary | ICD-10-CM | POA: Diagnosis not present

## 2017-01-26 DIAGNOSIS — M9913 Subluxation complex (vertebral) of lumbar region: Secondary | ICD-10-CM | POA: Diagnosis not present

## 2017-01-26 DIAGNOSIS — M5136 Other intervertebral disc degeneration, lumbar region: Secondary | ICD-10-CM | POA: Diagnosis not present

## 2017-01-26 DIAGNOSIS — M5441 Lumbago with sciatica, right side: Secondary | ICD-10-CM | POA: Diagnosis not present

## 2017-01-26 DIAGNOSIS — M9903 Segmental and somatic dysfunction of lumbar region: Secondary | ICD-10-CM | POA: Diagnosis not present

## 2017-02-23 DIAGNOSIS — R358 Other polyuria: Secondary | ICD-10-CM | POA: Diagnosis not present

## 2017-02-23 DIAGNOSIS — N401 Enlarged prostate with lower urinary tract symptoms: Secondary | ICD-10-CM | POA: Diagnosis not present

## 2017-02-23 DIAGNOSIS — N138 Other obstructive and reflux uropathy: Secondary | ICD-10-CM | POA: Diagnosis not present

## 2017-02-23 DIAGNOSIS — E291 Testicular hypofunction: Secondary | ICD-10-CM | POA: Diagnosis not present

## 2017-03-02 DIAGNOSIS — M5441 Lumbago with sciatica, right side: Secondary | ICD-10-CM | POA: Diagnosis not present

## 2017-03-02 DIAGNOSIS — M5136 Other intervertebral disc degeneration, lumbar region: Secondary | ICD-10-CM | POA: Diagnosis not present

## 2017-03-02 DIAGNOSIS — M9903 Segmental and somatic dysfunction of lumbar region: Secondary | ICD-10-CM | POA: Diagnosis not present

## 2017-03-02 DIAGNOSIS — M9913 Subluxation complex (vertebral) of lumbar region: Secondary | ICD-10-CM | POA: Diagnosis not present

## 2017-03-23 DIAGNOSIS — M9913 Subluxation complex (vertebral) of lumbar region: Secondary | ICD-10-CM | POA: Diagnosis not present

## 2017-03-23 DIAGNOSIS — M9903 Segmental and somatic dysfunction of lumbar region: Secondary | ICD-10-CM | POA: Diagnosis not present

## 2017-03-23 DIAGNOSIS — M5441 Lumbago with sciatica, right side: Secondary | ICD-10-CM | POA: Diagnosis not present

## 2017-03-23 DIAGNOSIS — M5136 Other intervertebral disc degeneration, lumbar region: Secondary | ICD-10-CM | POA: Diagnosis not present

## 2017-04-24 ENCOUNTER — Encounter: Payer: Self-pay | Admitting: Adult Health

## 2017-04-30 ENCOUNTER — Other Ambulatory Visit: Payer: Self-pay | Admitting: Family Medicine

## 2017-05-25 DIAGNOSIS — N401 Enlarged prostate with lower urinary tract symptoms: Secondary | ICD-10-CM | POA: Diagnosis not present

## 2017-05-25 DIAGNOSIS — N138 Other obstructive and reflux uropathy: Secondary | ICD-10-CM | POA: Diagnosis not present

## 2017-08-17 DIAGNOSIS — E291 Testicular hypofunction: Secondary | ICD-10-CM | POA: Diagnosis not present

## 2017-08-17 DIAGNOSIS — R358 Other polyuria: Secondary | ICD-10-CM | POA: Diagnosis not present

## 2017-08-17 DIAGNOSIS — N138 Other obstructive and reflux uropathy: Secondary | ICD-10-CM | POA: Diagnosis not present

## 2017-08-17 DIAGNOSIS — N401 Enlarged prostate with lower urinary tract symptoms: Secondary | ICD-10-CM | POA: Diagnosis not present

## 2017-10-06 ENCOUNTER — Ambulatory Visit: Payer: Medicare Other | Admitting: Adult Health

## 2017-10-06 ENCOUNTER — Encounter: Payer: Self-pay | Admitting: Adult Health

## 2017-10-06 VITALS — BP 138/80 | HR 93 | Temp 98.6°F | Wt 237.0 lb

## 2017-10-06 DIAGNOSIS — J069 Acute upper respiratory infection, unspecified: Secondary | ICD-10-CM | POA: Diagnosis not present

## 2017-10-06 MED ORDER — PREDNISONE 10 MG PO TABS: ORAL_TABLET | ORAL | 0 refills | Status: DC

## 2017-10-06 MED ORDER — HYDROCODONE-HOMATROPINE 5-1.5 MG/5ML PO SYRP: 5.0000 mL | ORAL_SOLUTION | Freq: Three times a day (TID) | ORAL | 0 refills | Status: DC | PRN

## 2017-10-06 NOTE — Progress Notes (Signed)
Subjective:    Patient ID: Daniel Hampton, male    DOB: November 21, 1940, 77 y.o.   MRN: [id number]  URI   This is a new problem. The current episode started in the past 7 days. The problem has been gradually improving. There has been no fever. Associated symptoms include congestion and coughing (dry ). Pertinent negatives include no rhinorrhea or sinus pain.      Review of Systems  Constitutional: Positive for chills and fatigue. Negative for fever.  HENT: Positive for congestion. Negative for rhinorrhea, sinus pressure and sinus pain.   Respiratory: Positive for cough (dry ) and chest tightness.   Cardiovascular: Negative.    Past Medical History:  Diagnosis Date  . Allergy    SEASONAL  . Asthma   . COLONIC POLYPS, HX OF 03/12/2007  . [place] DISEASE, LUMBAR 12/03/2008  . DIVERTICULOSIS, COLON 10/17/2009  . Hydrocele   . HYPERLIPIDEMIA 03/12/2007  . [place], RIGHT 03/13/2008  . SKIN CANCER, HX OF 10/17/2009  . VERTIGO, BENIGN PAROXYSMAL POSITION 04/28/2007    Social History   Socioeconomic History  . Marital status: Married    Spouse name: Not on file  . Number of children: 3  . Years of education: Not on file  . Highest education level: Not on file  Social Needs  . Financial resource strain: Not on file  . Food insecurity - worry: Not on file  . Food insecurity - inability: Not on file  . Transportation needs - medical: Not on file  . Transportation needs - non-medical: Not on file  Occupational History  . Occupation: Retired  Tobacco Use  . Smoking status: Former Smoker    Packs/day: 1.00    Years: 20.00    Pack years: 20.00    Types: Cigarettes    Last attempt to quit: 10/30/1978    Years since quitting: 38.9  . Smokeless tobacco: Never Used  Substance and Sexual Activity  . Alcohol use: Yes  . Drug use: No  . Sexual activity: Not on file  Other Topics Concern  . Not on file  Social History Narrative   Retired   Two daughters   Married     Past Surgical  History:  Procedure Laterality Date  . COLONOSCOPY W/ BIOPSIES    . MOHS SURGERY    . SPINE SURGERY     lumbar disk surgery    Family History  Problem Relation Age of Onset  . Lung cancer Mother   . COPD Father   . Heart attack Father   . Alcohol abuse Brother   . Lung cancer Brother        smoker    No Known Allergies  Current Outpatient Medications on File Prior to Visit  Medication Sig Dispense Refill  . aspirin 81 MG tablet Take 81 mg by mouth daily.      Marland Kitchen atorvastatin (LIPITOR) 40 MG tablet TAKE 1 TABLET (40 MG TOTAL) BY MOUTH DAILY. 90 tablet 2  . Multiple Vitamins-Minerals (MENS MULTI VITAMIN & MINERAL PO) Take by mouth daily.       No current facility-administered medications on file prior to visit.     BP 138/80 (BP Location: Left Arm, Patient Position: Sitting, Cuff Size: Large)   Pulse 93   Temp 98.6 F (37 C) (Oral)   Wt 237 lb (107.5 kg)   SpO2 94%   BMI 33.29 kg/m       Objective:   Physical Exam  Constitutional: He is oriented  to person, place, and time. He appears well-developed and well-nourished. No distress.  HENT:  Head: Normocephalic and atraumatic.  Right Ear: External ear normal.  Left Ear: External ear normal.  Nose: Nose normal.  Mouth/Throat: Oropharynx is clear and moist. No oropharyngeal exudate.  Cardiovascular: Normal rate, regular rhythm, normal heart sounds and intact distal pulses. Exam reveals no gallop and no friction rub.  No murmur heard. Pulmonary/Chest: Effort normal. He has wheezes (trace throughout ).  Neurological: He is alert and oriented to person, place, and time.  Skin: Skin is warm and dry. No rash noted. He is not diaphoretic. No erythema. No pallor.  Psychiatric: He has a normal mood and affect. His behavior is normal. Judgment and thought content normal.  Nursing note and vitals reviewed.     Assessment & Plan:  1. Upper respiratory tract infection, unspecified type - no signs of bacterial infection  -  HYDROcodone-homatropine (HYCODAN) 5-1.5 MG/5ML syrup; Take 5 mLs by mouth every 8 (eight) hours as needed for cough.  Dispense: 120 mL; Refill: 0 - predniSONE (DELTASONE) 10 MG tablet; 40 mg x 3 days, 20 mg x 3 days, 10 mg x 3 days  Dispense: 21 tablet; Refill: 0 - Follow up if no improvement in the next 2-3 days   Dorothyann Peng, NP

## 2017-10-12 ENCOUNTER — Other Ambulatory Visit: Payer: Self-pay | Admitting: Dermatology

## 2017-10-12 DIAGNOSIS — L821 Other seborrheic keratosis: Secondary | ICD-10-CM | POA: Diagnosis not present

## 2017-10-12 DIAGNOSIS — D485 Neoplasm of uncertain behavior of skin: Secondary | ICD-10-CM | POA: Diagnosis not present

## 2017-10-12 DIAGNOSIS — L820 Inflamed seborrheic keratosis: Secondary | ICD-10-CM | POA: Diagnosis not present

## 2017-10-12 DIAGNOSIS — D229 Melanocytic nevi, unspecified: Secondary | ICD-10-CM | POA: Diagnosis not present

## 2017-10-19 ENCOUNTER — Ambulatory Visit (INDEPENDENT_AMBULATORY_CARE_PROVIDER_SITE_OTHER)
Admission: RE | Admit: 2017-10-19 | Discharge: 2017-10-19 | Disposition: A | Discharge status: Home / Self Care | Payer: Medicare Other | Source: Ambulatory Visit | Attending: Family Medicine | Admitting: Family Medicine

## 2017-10-19 ENCOUNTER — Ambulatory Visit: Payer: Medicare Other | Admitting: Family Medicine

## 2017-10-19 ENCOUNTER — Encounter: Payer: Self-pay | Admitting: Family Medicine

## 2017-10-19 VITALS — BP 130/86 | HR 70 | Temp 98.4°F | Resp 12 | Ht 70.75 in | Wt 233.0 lb

## 2017-10-19 DIAGNOSIS — J988 Other specified respiratory disorders: Secondary | ICD-10-CM | POA: Diagnosis not present

## 2017-10-19 DIAGNOSIS — R053 Chronic cough: Secondary | ICD-10-CM

## 2017-10-19 DIAGNOSIS — R05 Cough: Secondary | ICD-10-CM | POA: Diagnosis not present

## 2017-10-19 MED ORDER — RANITIDINE HCL 150 MG PO TABS: 150.0000 mg | ORAL_TABLET | Freq: Two times a day (BID) | ORAL | 0 refills | Status: DC

## 2017-10-19 MED ORDER — ALBUTEROL SULFATE HFA 108 (90 BASE) MCG/ACT IN AERS: 2.0000 | INHALATION_SPRAY | Freq: Four times a day (QID) | RESPIRATORY_TRACT | 0 refills | Status: DC | PRN

## 2017-10-19 MED ORDER — DOXYCYCLINE HYCLATE 100 MG PO TABS: 100.0000 mg | ORAL_TABLET | Freq: Two times a day (BID) | ORAL | 0 refills | Status: AC

## 2017-10-19 NOTE — Progress Notes (Signed)
ACUTE VISIT  HPI:  Chief Complaint  Patient presents with  . Cough    saw Tommi Rumps 2 weeks ago, started 18 days ago  . Nasal Congestion  . Fever    [name] is a 77 y.o.male here today complaining of persistent cough. Problem started about 18 days ago. He was evaluated by PCP on 10/06/2017, he was prescribed Hycodan and a course of Prednisone. He has not noted any improvement, overall problem is stable. No history of GERD and he has not had heartburn. 3 days ago he had post tussive episode of vomiting.  He has had some intermittent night sweats, chills, and subjective fever a "different times."   He has been sleeping on a recliner because symptoms are worse upon lying down. He denies orthopnea or PND. He has no noted palpitations or lower extremity edema.   Cough  This is a new problem. The current episode started 1 to 4 weeks ago. The problem has been waxing and waning. The cough is non-productive. Associated symptoms include chills, a fever, postnasal drip, rhinorrhea and sweats. Pertinent negatives include no chest pain, ear pain, eye redness, headaches, heartburn, myalgias, nasal congestion, rash, sore throat, shortness of breath or wheezing. The symptoms are aggravated by lying down. Risk factors for lung disease include smoking/tobacco exposure. He has tried prescription cough suppressant and oral steroids for the symptoms. The treatment provided no relief. His past medical history is significant for environmental allergies.  Fever   Associated symptoms include coughing, nausea and vomiting. Pertinent negatives include no abdominal pain, chest pain, congestion, diarrhea, ear pain, headaches, rash, sore throat or wheezing.    No Hx of recent travel. No sick contact. No known insect bite.  Hx of allergies: According to records, he has history of asthma.   He also reports some allergies but have not been bad.  He denies wheezing or dyspnea.   Symptoms  otherwise stable.    Review of Systems  Constitutional: Positive for appetite change, chills, fatigue and fever. Negative for activity change.  HENT: Positive for postnasal drip and rhinorrhea. Negative for congestion, ear pain, mouth sores, sore throat, trouble swallowing and voice change.   Eyes: Negative for discharge, redness and itching.  Respiratory: Positive for cough. Negative for shortness of breath and wheezing.   Cardiovascular: Negative for chest pain, palpitations and leg swelling.  Gastrointestinal: Positive for nausea and vomiting. Negative for abdominal pain, diarrhea and heartburn.  Genitourinary: Negative for decreased urine volume and hematuria.  Musculoskeletal: Negative for back pain, gait problem and myalgias.  Skin: Negative for rash.  Allergic/Immunologic: Positive for environmental allergies.  Neurological: Negative for syncope, weakness and headaches.  Hematological: Negative for adenopathy. Does not bruise/bleed easily.  Psychiatric/Behavioral: Positive for sleep disturbance. Negative for confusion.      Current Outpatient Medications on File Prior to Visit  Medication Sig Dispense Refill  . aspirin 81 MG tablet Take 81 mg by mouth daily.      Marland Kitchen atorvastatin (LIPITOR) 40 MG tablet TAKE 1 TABLET (40 MG TOTAL) BY MOUTH DAILY. 90 tablet 2  . HYDROcodone-homatropine (HYCODAN) 5-1.5 MG/5ML syrup Take 5 mLs by mouth every 8 (eight) hours as needed for cough. 120 mL 0  . Multiple Vitamins-Minerals (MENS MULTI VITAMIN & MINERAL PO) Take by mouth daily.      . predniSONE (DELTASONE) 10 MG tablet 40 mg x 3 days, 20 mg x 3 days, 10 mg x 3 days (Patient not taking: Reported on  10/19/2017) 21 tablet 0   No current facility-administered medications on file prior to visit.      Past Medical History:  Diagnosis Date  . Allergy    SEASONAL  . Asthma   . COLONIC POLYPS, HX OF 03/12/2007  . [place] DISEASE, LUMBAR 12/03/2008  . DIVERTICULOSIS, COLON 10/17/2009  . Hydrocele     . HYPERLIPIDEMIA 03/12/2007  . [place], RIGHT 03/13/2008  . SKIN CANCER, HX OF 10/17/2009  . VERTIGO, BENIGN PAROXYSMAL POSITION 04/28/2007   No Known Allergies  Social History   Socioeconomic History  . Marital status: Married    Spouse name: None  . Number of children: 3  . Years of education: None  . Highest education level: None  Social Needs  . Financial resource strain: None  . Food insecurity - worry: None  . Food insecurity - inability: None  . Transportation needs - medical: None  . Transportation needs - non-medical: None  Occupational History  . Occupation: Retired  Tobacco Use  . Smoking status: Former Smoker    Packs/day: 1.00    Years: 20.00    Pack years: 20.00    Types: Cigarettes    Last attempt to quit: 10/30/1978    Years since quitting: 38.9  . Smokeless tobacco: Never Used  Substance and Sexual Activity  . Alcohol use: Yes  . Drug use: No  . Sexual activity: None  Other Topics Concern  . None  Social History Narrative   Retired   Two daughters   Married     Vitals:   10/19/17 0958  BP: 130/86  Pulse: 70  Resp: 12  Temp: 98.4 F (36.9 C)  SpO2: 96%   Body mass index is 32.73 kg/m.   Physical Exam  Nursing note and vitals reviewed. Constitutional: He is oriented to person, place, and time. He appears well-developed. He does not appear ill. No distress.  HENT:  Head: Normocephalic and atraumatic.  Right Ear: Tympanic membrane, external ear and ear canal normal.  Left Ear: Tympanic membrane, external ear and ear canal normal.  Mouth/Throat: Oropharynx is clear and moist and mucous membranes are normal.  Eyes: Conjunctivae are normal. Pupils are equal, round, and reactive to light.  Neck: No JVD present.  Cardiovascular: Normal rate and regular rhythm.  Murmur (SEM I/VI RUSB) heard. Pulses:      Posterior tibial pulses are 2+ on the right side, and 2+ on the left side.  Respiratory: Effort normal and breath sounds normal. No  respiratory distress.  GI: Soft. He exhibits no mass. There is no tenderness.  Musculoskeletal: He exhibits no edema or tenderness.  Lymphadenopathy:       Head (right side): No submandibular adenopathy present.       Head (left side): No submandibular adenopathy present.    He has no cervical adenopathy.  Neurological: He is alert and oriented to person, place, and time. He has normal strength.  Skin: Skin is warm. No rash noted. No erythema.  Psychiatric: His mood appears anxious.  Well groomed, good eye contact.    ASSESSMENT AND PLAN:   Mr. Daniel Hampton was seen today for cough, nasal congestion and fever.  Diagnoses and all orders for this visit:  Persistent cough  We discussed possible etiologies, including allergies, residual symptoms s/p URI, GERD, asthma,COPD cardiac, as well as infectious process.  GERD: She is not having heartburn, still because persistent cough that is worse when laying down, I recommend Zantac 150 mg at bedtime for 3-4 weeks.  GERD precautions also will help. Asthma/COPD: Former smoker.   He has no noted wheezing or dyspnea, lung auscultation today negative. Albuterol inh 2 puff every 6 hours for a week then as needed for wheezing or shortness of breath.   OTC antihistaminic may also help. Further recommendations will be given according to imaging results.  Recommend following with PCP in 2 weeks, before if needed.  -     DG Chest 2 View; Future -     albuterol (PROVENTIL HFA;VENTOLIN HFA) 108 (90 Base) MCG/ACT inhaler; Inhale 2 puffs into the lungs every 6 (six) hours as needed for wheezing or shortness of breath. -     ranitidine (ZANTAC) 150 MG tablet; Take 1 tablet (150 mg total) by mouth 2 (two) times daily.  Respiratory tract infection  Because cough has been persistent and he is reporting night sweats, chills, and subjective fever, I recommend oral antibiotics. We discussed side effects of antibiotics in general. Instructed about warning  signs.  -     doxycycline (VIBRA-TABS) 100 MG tablet; Take 1 tablet (100 mg total) by mouth 2 (two) times daily for 7 days.     -Mr. Daniel Hampton advised to seek attention immediately if symptoms worsen.      Betty G. Martinique, MD  [facility]. [place] office.

## 2017-10-19 NOTE — Patient Instructions (Addendum)
  [name] I have seen you today for an acute visit.  A few things to remember from today's visit:   Persistent cough - Plan: DG Chest 2 View  Respiratory tract infection   Medications prescribed today are intended for short period of time and will not be refill upon request, a follow up appointment might be necessary to discuss continuation of of treatment if appropriate.  Albuterol inh 2 puff every 6 hours for a week then as needed for wheezing or shortness of breath.   Plain Mucinex,plenty of po fluids.  ? GERD, Zantac 150 mg at bedtime.   In general please monitor for signs of worsening symptoms and seek immediate medical attention if any concerning.    I hope you get better soon!

## 2017-10-20 ENCOUNTER — Encounter: Payer: Self-pay | Admitting: Family Medicine

## 2017-10-22 ENCOUNTER — Ambulatory Visit (INDEPENDENT_AMBULATORY_CARE_PROVIDER_SITE_OTHER): Payer: Medicare Other | Admitting: Adult Health

## 2017-10-22 ENCOUNTER — Encounter: Payer: Self-pay | Admitting: Adult Health

## 2017-10-22 VITALS — BP 130/72 | Temp 98.1°F | Ht 69.5 in | Wt 231.0 lb

## 2017-10-22 DIAGNOSIS — Z125 Encounter for screening for malignant neoplasm of prostate: Secondary | ICD-10-CM | POA: Diagnosis not present

## 2017-10-22 DIAGNOSIS — J069 Acute upper respiratory infection, unspecified: Secondary | ICD-10-CM | POA: Diagnosis not present

## 2017-10-22 DIAGNOSIS — E785 Hyperlipidemia, unspecified: Secondary | ICD-10-CM | POA: Diagnosis not present

## 2017-10-22 DIAGNOSIS — Z0000 Encounter for general adult medical examination without abnormal findings: Secondary | ICD-10-CM | POA: Diagnosis not present

## 2017-10-22 LAB — LIPID PANEL
CHOL/HDL RATIO: 4
Cholesterol: 186 mg/dL (ref 0–200)
HDL: 41.7 mg/dL (ref >39.00)
LDL CALC: 121 mg/dL — AB (ref 0–99)
NonHDL: 143.91
TRIGLYCERIDES: 114 mg/dL (ref 0.0–149.0)
VLDL: 22.8 mg/dL (ref 0.0–40.0)

## 2017-10-22 LAB — COMPREHENSIVE METABOLIC PANEL
ALBUMIN: 4.5 g/dL (ref 3.5–5.2)
ALK PHOS: 57 U/L (ref 39–117)
ALT: 63 U/L — ABNORMAL HIGH (ref 0–53)
AST: 36 U/L (ref 0–37)
BILIRUBIN TOTAL: 0.6 mg/dL (ref 0.2–1.2)
BUN: 12 mg/dL (ref 6–23)
CALCIUM: 9.5 mg/dL (ref 8.4–10.5)
CO2: 29 mEq/L (ref 19–32)
CREATININE: 0.93 mg/dL (ref 0.40–1.50)
Chloride: 100 mEq/L (ref 96–112)
GFR: 101.11 mL/min (ref >60.00)
Glucose, Bld: 102 mg/dL — ABNORMAL HIGH (ref 70–99)
Potassium: 4.2 mEq/L (ref 3.5–5.1)
Sodium: 139 mEq/L (ref 135–145)
Total Protein: 7.2 g/dL (ref 6.0–8.3)

## 2017-10-22 LAB — CBC WITH DIFFERENTIAL/PLATELET
BASOS ABS: 0.1 10*3/uL (ref 0.0–0.1)
Basophils Relative: 0.6 % (ref 0.0–3.0)
EOS ABS: 0.2 10*3/uL (ref 0.0–0.7)
Eosinophils Relative: 1.4 % (ref 0.0–5.0)
HEMATOCRIT: 44.1 % (ref 39.0–52.0)
HEMOGLOBIN: 15 g/dL (ref 13.0–17.0)
LYMPHS PCT: 22.1 % (ref 12.0–46.0)
Lymphs Abs: 2.5 10*3/uL (ref 0.7–4.0)
MCHC: 34 g/dL (ref 30.0–36.0)
MCV: 92.7 fl (ref 78.0–100.0)
Monocytes Absolute: 0.9 10*3/uL (ref 0.1–1.0)
Monocytes Relative: 7.6 % (ref 3.0–12.0)
Neutro Abs: 7.7 10*3/uL (ref 1.4–7.7)
Neutrophils Relative %: 68.3 % (ref 43.0–77.0)
PLATELETS: 311 10*3/uL (ref 150.0–400.0)
RBC: 4.76 Mil/uL (ref 4.22–5.81)
RDW: 12.8 % (ref 11.5–15.5)
WBC: 11.2 10*3/uL — AB (ref 4.0–10.5)

## 2017-10-22 LAB — TSH: TSH: 2.12 u[IU]/mL (ref 0.35–4.50)

## 2017-10-22 LAB — PSA: PSA: 5.51 ng/mL — ABNORMAL HIGH (ref 0.10–4.00)

## 2017-10-22 MED ORDER — HYDROCODONE-HOMATROPINE 5-1.5 MG/5ML PO SYRP: 5.0000 mL | ORAL_SOLUTION | Freq: Three times a day (TID) | ORAL | 0 refills | Status: DC | PRN

## 2017-10-22 NOTE — Progress Notes (Signed)
Subjective:    Patient ID: Daniel Hampton, male    DOB: 05-Mar-1941, 77 y.o.   MRN: [id number]  HPI  Patient presents for yearly preventative medicine examination. He is a pleasant 77 year old male who  has a past medical history of Allergy, Asthma, COLONIC POLYPS, HX OF (03/12/2007), DISC DISEASE, LUMBAR (12/03/2008), DIVERTICULOSIS, COLON (10/17/2009), Hydrocele, HYPERLIPIDEMIA (03/12/2007), MORT[place]'S NEUROMA, RIGHT (03/13/2008), SKIN CANCER, HX OF (10/17/2009), and VERTIGO, BENIGN PAROXYSMAL POSITION (04/28/2007).  He takes Lipitor and ASA for history of hyperlipidemia   He was recently started on Zantac 150 mg for cough that was possibly from GERD  All immunizations and health maintenance protocols were reviewed with the patient and needed orders were placed.  Vaccinations are up-to-date  Appropriate screening laboratory values were ordered for the patient including screening of hyperlipidemia, renal function and hepatic function. If indicated by BPH, a PSA was ordered.  Medication reconciliation,  past medical history, social history, problem list and allergies were reviewed in detail with the patient  Goals were established with regard to weight loss, exercise, and  diet in compliance with medications  End of life planning was discussed. He has an advanced directive and living will.   He no longer needs to have colonoscopies done due to age.  He is up-to-date on vision and dental screens  He was recently treated by Dr. Martinique for URI like symptoms. Chest x ray was normal. He was prescribed Doxycyline and reports that he is starting to feel better. He continues to have a dry cough that is keeping him up at night. He is asking for another bottle of Hycodan  He is seen by Urology on a routine basis. Currently being treated with Androgel for low T. She does not notice a difference when using.   Review of Systems  Constitutional: Negative.   HENT: Negative.   Eyes: Negative.   Respiratory:  Positive for cough.   Cardiovascular: Negative.   Gastrointestinal: Negative.   Endocrine: Negative.   Genitourinary: Negative.   Musculoskeletal: Negative.   Skin: Negative.   Allergic/Immunologic: Negative.   Neurological: Negative.   Hematological: Negative.   Psychiatric/Behavioral: Negative.   All other systems reviewed and are negative.  Past Medical History:  Diagnosis Date  . Allergy    SEASONAL  . Asthma   . COLONIC POLYPS, HX OF 03/12/2007  . [place] DISEASE, LUMBAR 12/03/2008  . DIVERTICULOSIS, COLON 10/17/2009  . Hydrocele   . HYPERLIPIDEMIA 03/12/2007  . [place], RIGHT 03/13/2008  . SKIN CANCER, HX OF 10/17/2009  . VERTIGO, BENIGN PAROXYSMAL POSITION 04/28/2007    Social History   Socioeconomic History  . Marital status: Married    Spouse name: Not on file  . Number of children: 3  . Years of education: Not on file  . Highest education level: Not on file  Occupational History  . Occupation: Retired  Scientific laboratory technician  . Financial resource strain: Not on file  . Food insecurity:    Worry: Not on file    Inability: Not on file  . Transportation needs:    Medical: Not on file    Non-medical: Not on file  Tobacco Use  . Smoking status: Former Smoker    Packs/day: 1.00    Years: 20.00    Pack years: 20.00    Types: Cigarettes    Last attempt to quit: 10/30/1978    Years since quitting: 39.0  . Smokeless tobacco: Never Used  Substance and Sexual Activity  . Alcohol  use: Yes  . Drug use: No  . Sexual activity: Not on file  Lifestyle  . Physical activity:    Days per week: Not on file    Minutes per session: Not on file  . Stress: Not on file  Relationships  . Social connections:    Talks on phone: Not on file    Gets together: Not on file    Attends religious service: Not on file    Active member of club or organization: Not on file    Attends meetings of clubs or organizations: Not on file    Relationship status: Not on file  . Intimate partner  violence:    Fear of current or ex partner: Not on file    Emotionally abused: Not on file    Physically abused: Not on file    Forced sexual activity: Not on file  Other Topics Concern  . Not on file  Social History Narrative   Retired   Two daughters   Married     Past Surgical History:  Procedure Laterality Date  . COLONOSCOPY W/ BIOPSIES    . MOHS SURGERY    . SPINE SURGERY     lumbar disk surgery    Family History  Problem Relation Age of Onset  . Lung cancer Mother   . COPD Father   . Heart attack Father   . Alcohol abuse Brother   . Lung cancer Brother        smoker    No Known Allergies  Current Outpatient Medications on File Prior to Visit  Medication Sig Dispense Refill  . albuterol (PROVENTIL HFA;VENTOLIN HFA) 108 (90 Base) MCG/ACT inhaler Inhale 2 puffs into the lungs every 6 (six) hours as needed for wheezing or shortness of breath. 1 Inhaler 0  . aspirin 81 MG tablet Take 81 mg by mouth daily.      Marland Kitchen atorvastatin (LIPITOR) 40 MG tablet TAKE 1 TABLET (40 MG TOTAL) BY MOUTH DAILY. 90 tablet 2  . doxycycline (VIBRA-TABS) 100 MG tablet Take 1 tablet (100 mg total) by mouth 2 (two) times daily for 7 days. 14 tablet 0  . Multiple Vitamins-Minerals (MENS MULTI VITAMIN & MINERAL PO) Take by mouth daily.      . ranitidine (ZANTAC) 150 MG tablet Take 1 tablet (150 mg total) by mouth 2 (two) times daily. 30 tablet 0  . Testosterone 20.25 MG/ACT (1.62%) GEL   4   No current facility-administered medications on file prior to visit.     BP 130/72 (BP Location: Left Arm)   Temp 98.1 F (36.7 C) (Oral)   Ht 5' 9.5" (1.765 m) Comment: WITHOUT SHOES  Wt 231 lb (104.8 kg)   BMI 33.62 kg/m       Objective:   Physical Exam  Constitutional: He is oriented to person, place, and time. He appears well-developed and well-nourished. No distress.  HENT:  Head: Normocephalic and atraumatic.  Right Ear: External ear normal.  Left Ear: External ear normal.  Nose: Nose  normal.  Mouth/Throat: Oropharynx is clear and moist. No oropharyngeal exudate.  Eyes: Pupils are equal, round, and reactive to light. Conjunctivae and EOM are normal. Right eye exhibits no discharge. Left eye exhibits no discharge. No scleral icterus.  Neck: Normal range of motion. Neck supple. No JVD present. No tracheal deviation present. No thyromegaly present.  Cardiovascular: Normal rate, regular rhythm, normal heart sounds and intact distal pulses. Exam reveals no gallop and no friction rub.  No murmur  heard. Pulmonary/Chest: Effort normal and breath sounds normal. No stridor. No respiratory distress. He has no wheezes. He has no rales. He exhibits no tenderness.  Abdominal: Soft. Bowel sounds are normal. He exhibits no distension and no mass. There is no tenderness. There is no rebound and no guarding.  Genitourinary:  Genitourinary Comments: Will do PSA   Musculoskeletal: Normal range of motion. He exhibits no edema, tenderness or deformity.  Lymphadenopathy:    He has no cervical adenopathy.  Neurological: He is alert and oriented to person, place, and time. He has normal reflexes. He displays normal reflexes. No cranial nerve deficit. He exhibits normal muscle tone. Coordination normal.  Skin: Skin is warm and dry. No rash noted. He is not diaphoretic. No erythema. No pallor.  Psychiatric: He has a normal mood and affect. His behavior is normal. Judgment and thought content normal.  Nursing note and vitals reviewed.     Assessment & Plan:  1. Routine general medical examination at a health care facility - Continue to work on diet and exercise - Follow up in one year or sooner if needed - Comprehensive metabolic panel - CBC with Differential/Platelet - Lipid panel - TSH  2. Hyperlipidemia, unspecified hyperlipidemia type - Consider increase in statin  - Comprehensive metabolic panel - CBC with Differential/Platelet - Lipid panel - TSH  3. Prostate cancer screening  -  PSA  4. Upper respiratory tract infection, unspecified type  - HYDROcodone-homatropine (HYCODAN) 5-1.5 MG/5ML syrup; Take 5 mLs by mouth every 8 (eight) hours as needed for cough.  Dispense: 120 mL; Refill: 0   Dorothyann Peng, NP

## 2017-11-16 DIAGNOSIS — R972 Elevated prostate specific antigen [PSA]: Secondary | ICD-10-CM | POA: Diagnosis not present

## 2017-11-16 DIAGNOSIS — N138 Other obstructive and reflux uropathy: Secondary | ICD-10-CM | POA: Diagnosis not present

## 2017-11-16 DIAGNOSIS — R358 Other polyuria: Secondary | ICD-10-CM | POA: Diagnosis not present

## 2017-11-16 DIAGNOSIS — E291 Testicular hypofunction: Secondary | ICD-10-CM | POA: Diagnosis not present

## 2017-11-16 DIAGNOSIS — T387X5D Adverse effect of androgens and anabolic congeners, subsequent encounter: Secondary | ICD-10-CM | POA: Diagnosis not present

## 2017-11-16 DIAGNOSIS — N401 Enlarged prostate with lower urinary tract symptoms: Secondary | ICD-10-CM | POA: Diagnosis not present

## 2017-11-18 ENCOUNTER — Telehealth: Payer: Self-pay | Admitting: Adult Health

## 2017-11-18 NOTE — Telephone Encounter (Signed)
Spoke to the pt and notified him that he will need to be seen in the office to be evaluated for this cough medications as it is a controlled substance.  Pt has decided to wait at this time.  Will call back if needed.

## 2017-11-18 NOTE — Telephone Encounter (Signed)
Copied from [place] [id number]. Topic: Quick Communication - Rx Refill/Question >> Nov 18, 2017  9:52 AM Ether Griffins B wrote: Medication: HYDROcodone-homatropine (HYCODAN) 5-1.5 MG/5ML syrup  Pt needs it for night time. It is the only thing that helps with his cough.  Preferred Pharmacy (with phone number or street name): [place] Agent: Please be advised that RX refills may take up to 3 business days. We ask that you follow-up with your pharmacy.

## 2017-12-25 DIAGNOSIS — H2513 Age-related nuclear cataract, bilateral: Secondary | ICD-10-CM | POA: Diagnosis not present

## 2017-12-25 DIAGNOSIS — H35373 Puckering of macula, bilateral: Secondary | ICD-10-CM | POA: Diagnosis not present

## 2018-03-17 DIAGNOSIS — H25812 Combined forms of age-related cataract, left eye: Secondary | ICD-10-CM | POA: Diagnosis not present

## 2018-03-17 DIAGNOSIS — H2512 Age-related nuclear cataract, left eye: Secondary | ICD-10-CM | POA: Diagnosis not present

## 2018-04-22 DIAGNOSIS — Z961 Presence of intraocular lens: Secondary | ICD-10-CM | POA: Diagnosis not present

## 2018-05-11 ENCOUNTER — Ambulatory Visit: Payer: Medicare Other | Admitting: Adult Health

## 2018-05-12 ENCOUNTER — Encounter: Payer: Self-pay | Admitting: Adult Health

## 2018-05-12 ENCOUNTER — Ambulatory Visit: Payer: Medicare Other | Admitting: Adult Health

## 2018-05-12 VITALS — BP 138/70 | HR 61 | Temp 97.9°F | Wt 233.4 lb

## 2018-05-12 DIAGNOSIS — M7711 Lateral epicondylitis, right elbow: Secondary | ICD-10-CM | POA: Diagnosis not present

## 2018-05-12 DIAGNOSIS — Z23 Encounter for immunization: Secondary | ICD-10-CM | POA: Diagnosis not present

## 2018-05-12 DIAGNOSIS — R7989 Other specified abnormal findings of blood chemistry: Secondary | ICD-10-CM

## 2018-05-12 MED ORDER — IBUPROFEN 600 MG PO TABS: 600.0000 mg | ORAL_TABLET | Freq: Three times a day (TID) | ORAL | 0 refills | Status: DC | PRN

## 2018-05-12 NOTE — Progress Notes (Signed)
Subjective:    Patient ID: Daniel Hampton, male    DOB: 04-02-1941, 77 y.o.   MRN: [id number]  HPI 77 year old male who  has a past medical history of Allergy, Asthma, COLONIC POLYPS, HX OF (03/12/2007), DISC DISEASE, LUMBAR (12/03/2008), DIVERTICULOSIS, COLON (10/17/2009), Hydrocele, HYPERLIPIDEMIA (03/12/2007), MORTON'S NEUROMA, RIGHT (03/13/2008), SKIN CANCER, HX OF (10/17/2009), and VERTIGO, BENIGN PAROXYSMAL POSITION (04/28/2007).  He presents to the office today for the acute complaint of right elbow pain. His pain has been present for six months. He had tried acupuncture six times but did not find any benefit from this. He continues to have pain along the medial aspect of the right elbow. Pain worse with movements and playing golf   He would also like to be referred to [facility] for management of low T. He is currently being seen by a Urologist in [facility] and does not feel as though what they are doing is working for him.    Review of Systems See HPI   Past Medical History:  Diagnosis Date  . Allergy    SEASONAL  . Asthma   . COLONIC POLYPS, HX OF 03/12/2007  . [place] DISEASE, LUMBAR 12/03/2008  . DIVERTICULOSIS, COLON 10/17/2009  . Hydrocele   . HYPERLIPIDEMIA 03/12/2007  . [place], RIGHT 03/13/2008  . SKIN CANCER, HX OF 10/17/2009  . VERTIGO, BENIGN PAROXYSMAL POSITION 04/28/2007    Social History   Socioeconomic History  . Marital status: Married    Spouse name: Not on file  . Number of children: 3  . Years of education: Not on file  . Highest education level: Not on file  Occupational History  . Occupation: Retired  Scientific laboratory technician  . Financial resource strain: Not on file  . Food insecurity:    Worry: Not on file    Inability: Not on file  . Transportation needs:    Medical: Not on file    Non-medical: Not on file  Tobacco Use  . Smoking status: Former Smoker    Packs/day: 1.00    Years: 20.00    Pack years: 20.00    Types: Cigarettes    Last attempt to  quit: 10/30/1978    Years since quitting: 39.5  . Smokeless tobacco: Never Used  Substance and Sexual Activity  . Alcohol use: Yes  . Drug use: No  . Sexual activity: Not on file  Lifestyle  . Physical activity:    Days per week: Not on file    Minutes per session: Not on file  . Stress: Not on file  Relationships  . Social connections:    Talks on phone: Not on file    Gets together: Not on file    Attends religious service: Not on file    Active member of club or organization: Not on file    Attends meetings of clubs or organizations: Not on file    Relationship status: Not on file  . Intimate partner violence:    Fear of current or ex partner: Not on file    Emotionally abused: Not on file    Physically abused: Not on file    Forced sexual activity: Not on file  Other Topics Concern  . Not on file  Social History Narrative   Retired   Two daughters   Married     Past Surgical History:  Procedure Laterality Date  . COLONOSCOPY W/ BIOPSIES    . MOHS SURGERY    . SPINE SURGERY  lumbar disk surgery    Family History  Problem Relation Age of Onset  . Lung cancer Mother   . COPD Father   . Heart attack Father   . Alcohol abuse Brother   . Lung cancer Brother        smoker    No Known Allergies  Current Outpatient Medications on File Prior to Visit  Medication Sig Dispense Refill  . albuterol (PROVENTIL HFA;VENTOLIN HFA) 108 (90 Base) MCG/ACT inhaler Inhale 2 puffs into the lungs every 6 (six) hours as needed for wheezing or shortness of breath. 1 Inhaler 0  . aspirin 81 MG tablet Take 81 mg by mouth daily.      Marland Kitchen atorvastatin (LIPITOR) 40 MG tablet TAKE 1 TABLET (40 MG TOTAL) BY MOUTH DAILY. 90 tablet 2  . HYDROcodone-homatropine (HYCODAN) 5-1.5 MG/5ML syrup Take 5 mLs by mouth every 8 (eight) hours as needed for cough. 120 mL 0  . Multiple Vitamins-Minerals (MENS MULTI VITAMIN & MINERAL PO) Take by mouth daily.      . ranitidine (ZANTAC) 150 MG tablet Take  1 tablet (150 mg total) by mouth 2 (two) times daily. 30 tablet 0  . Testosterone 20.25 MG/ACT (1.62%) GEL   4   No current facility-administered medications on file prior to visit.     BP 138/70 (BP Location: Left Arm, Patient Position: Sitting, Cuff Size: Normal)   Pulse 61   Temp 97.9 F (36.6 C) (Oral)   Wt 233 lb 7 oz (105.9 kg)   SpO2 97%   BMI 33.98 kg/m       Objective:   Physical Exam  Constitutional: He appears well-developed and well-nourished. No distress.  Cardiovascular: Normal rate, regular rhythm, normal heart sounds and intact distal pulses.  Pulmonary/Chest: Effort normal and breath sounds normal.  Musculoskeletal: Normal range of motion. He exhibits tenderness (Medial epicondylitis). He exhibits no edema or deformity.  Skin: He is not diaphoretic.  Nursing note and vitals reviewed.     Assessment & Plan:  1. Lateral epicondylitis of right elbow - Will send to Sports Medicine for evaluation of steroid injection  - Advised rest, motrin, and ice  - Ambulatory referral to Sports Medicine  2. Low testosterone in male  - Ambulatory referral to Urology  Dorothyann Peng, NP

## 2018-05-17 ENCOUNTER — Encounter: Payer: Self-pay | Admitting: Sports Medicine

## 2018-05-17 ENCOUNTER — Ambulatory Visit: Payer: Self-pay

## 2018-05-17 ENCOUNTER — Ambulatory Visit: Payer: Medicare Other | Admitting: Sports Medicine

## 2018-05-17 VITALS — BP 120/72 | HR 59 | Ht 69.5 in | Wt 236.8 lb

## 2018-05-17 DIAGNOSIS — M7701 Medial epicondylitis, right elbow: Secondary | ICD-10-CM | POA: Insufficient documentation

## 2018-05-17 DIAGNOSIS — M7711 Lateral epicondylitis, right elbow: Secondary | ICD-10-CM | POA: Insufficient documentation

## 2018-05-17 MED ORDER — NITROGLYCERIN 0.2 MG/HR TD PT24: MEDICATED_PATCH | TRANSDERMAL | 1 refills | Status: DC

## 2018-05-17 NOTE — Procedures (Signed)
LIMITED MSK ULTRASOUND OF Right elbow Images were obtained and interpreted by myself, Teresa Coombs, DO  Images have been saved and stored to PACS system. Images obtained on: GE S7 Ultrasound machine  FINDINGS:   Lateral condyle with hypoechoic change and fragmentation of the superior aspect of the lateral condyle consistent with chronic common extensor tendon tendinosis/lateral epicondylosis/tennis elbow.  There is increased neovascularity within the region of the lateral condyle  Medial epicondyle has hypoechoic change within the common flexor tendon origin.  Overall well-maintained cortical boundaries.  Minimal neovascularity..  IMPRESSION:  1. Chronic lateral epicondylosis moderate to severe 2. Chronic medial epicondylosis mild to moderate

## 2018-05-17 NOTE — Progress Notes (Signed)
PROCEDURE NOTE: THERAPEUTIC EXERCISES (97110) 15 minutes spent for Therapeutic exercises as below and as referenced in the AVS.  This included exercises focusing on stretching, strengthening, with significant focus on eccentric aspects.   Proper technique shown and discussed handout in great detail with ATC.  All questions were discussed and answered.   Long term goals include an improvement in range of motion, strength, endurance as well as avoiding reinjury. Frequency of visits is one time as determined during today's  office visit. Frequency of exercises to be performed is as per handout.  EXERCISES REVIEWED:  Medial and lateral epicondylosis stretching and eccentric exercising

## 2018-05-17 NOTE — Patient Instructions (Addendum)
Nitroglycerin Protocol   Apply 1/4 nitroglycerin patch to affected area daily.  Change position of patch within the affected area every 24 hours.  You may experience a headache during the first 1-2 weeks of using the patch, these should subside.  If you experience headaches after beginning nitroglycerin patch treatment, you may take your preferred over the counter pain reliever.  Another side effect of the nitroglycerin patch is skin irritation or rash related to patch adhesive.  Please notify our office if you develop more severe headaches or rash, and stop the patch.  Tendon healing with nitroglycerin patch may require 12 to 24 weeks depending on the extent of injury.  Men should not use if taking Viagra, Cialis, or Levitra.   Do not use if you have migraines or rosacea.    Please perform the exercise program that we have prepared for you and gone over in detail on a daily basis.  In addition to the handout you were provided you can access your program through: [email or web address]   Your unique program code is:  [email or web address]

## 2018-05-17 NOTE — Progress Notes (Signed)
Daniel Hampton. Daniel Hampton, [place] at [place] - 77 y.o. male MRN [id number]  Date of birth: 1940-09-14  Visit Date: 05/17/2018  PCP: Dorothyann Peng, NP   Referred by: Dorothyann Peng, NP   Scribe(s) for today's visit: Daniel Hampton, LAT, ATC  SUBJECTIVE:  Daniel Hampton is here for New Patient (Initial Visit) (R elbow pain)  Referred by: Dorothyann Peng  HPI: His R elbow symptoms INITIALLY: Began in March 2019 after moving a bunch of heavy furniture.  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 Described as mild nagging pain, nonradiating currently but can radiate to the R forearm Worsened with playing golf, working out at the gym Improved with counterforce strap, IBU 600 mg Additional associated symptoms include: no N/T and no swelling noted in the R UE;     At this time symptoms show no change compared to onset  He has tired acupuncture and is taking 600mg  IBU.  REVIEW OF SYSTEMS: Reports night time disturbances. Denies fevers, chills, or night sweats. Denies unexplained weight loss. Reports personal history of cancer. Skin CA (basal cell carcinoma) - 10/17/09 Denies changes in bowel or bladder habits. Denies recent unreported falls. Denies new or worsening dyspnea or wheezing. Denies headaches or dizziness.  Reports numbness, tingling or weakness  In the extremities - in B LEs.  Has hx of L3-4 spinal surgery. Denies dizziness or presyncopal episodes Denies lower extremity edema    HISTORY:  Prior history reviewed and updated per electronic medical record.  Social History   Occupational History  . Occupation: Retired  Tobacco Use  . Smoking status: Former Smoker    Packs/day: 1.00    Years: 20.00    Pack years: 20.00    Types: Cigarettes    Last attempt to quit: 10/30/1978    Years since quitting: 39.5  . Smokeless tobacco: Never Used  Substance and  Sexual Activity  . Alcohol use: Yes  . Drug use: No  . Sexual activity: Not on file   Social History   Social History Narrative   Retired   Two daughters   Married     DATA OBTAINED & REVIEWED:  No results for input(s): HGBA1C, LABURIC, CREATINE in the last 8760 hours. . 05/17/2018: MSK ultrasound shows both medial and lateral epicondylosis. .   OBJECTIVE:  VS:  HT:5' 9.5" (176.5 cm)   WT:236 lb 12.8 oz (107.4 kg)  BMI:34.48    BP:120/72  HR:(!) 59bpm  TEMP: ( )  RESP:95 %   PHYSICAL EXAM: CONSTITUTIONAL: Well-developed, Well-nourished and In no acute distress PSYCHIATRIC: Alert & appropriately interactive. and Not depressed or anxious appearing. RESPIRATORY: No increased work of breathing and Trachea Midline EYES: Pupils are equal., EOM intact without nystagmus. and No scleral icterus.  VASCULAR EXAM: Warm and well perfused NEURO: unremarkable  MSK Exam: Right elbow  Well aligned, no significant deformity. Slight bruising and purpuric changes consistent with senile purpura. TTP over Lateral epicondyle and minimally over the common extensor tendons.  Common flexor tendons are essentially pain-free with mild pain over the medial epicondyle.  There is fascial tension within the entire forearm   RANGE OF MOTION & STRENGTH  3 degree extensor lag of the right elbow with full flexion.  Supination pronation is limited functionally but passively is normal.   SPECIALITY TESTING:  No pain with third finger extension  ASSESSMENT   1. Lateral epicondylitis of right elbow   2. Medial epicondylitis of elbow, right     PLAN:  Pertinent additional documentation may be included in corresponding procedure notes, imaging studies, problem based documentation and patient instructions.  Procedures:  . Discussed the foundation of treatment for this condition is physical therapy and/or daily (5-6 days/week) therapeutic exercises, focusing on core strengthening, coordination,  neuromuscular control/reeducation.  Therapeutic exercises prescribed per procedure note.  Medications:  Meds ordered this encounter  Medications  . nitroGLYCERIN (NITRODUR - DOSED IN MG/24 HR) 0.2 mg/hr patch    Sig: Place 1/4 to 1/2 of a patch over affected region. Remove and replace once daily.  Slightly alter skin placement daily    Dispense:  30 patch    Refill:  1    For musculoskeletal purposes.  Okay to cut patch.   Discussion/Instructions: No problem-specific Assessment & Plan notes found for this encounter.  . Discussed options with the patient today including biologic treatment with topical nitroglycerin. Patient has no contraindications & understands the risks, benefits and intentions of treatment. Emphasized the importance of rotating sites as well as appropriate and expected adverse reactions including orthostasis, headache, adhesive sensitivity. Begin with 1/4 patch to the affected area. Okay to titrate to half a patch as tolerated. . Therapeutic exercises per procedure noteDiscussed that the anticipated amount of time for healing is 12- 24 weeks for Tendinopathic changes.  Emphasized the importance of improving blood flow as well as eccentric loading of the tendon.  We will plan to check in in 6 weeks and repeat ultrasound at that time.  . Discussed red flag symptoms that warrant earlier emergent evaluation and patient voices understanding. . Activity modifications and the importance of avoiding exacerbating activities (limiting pain to no more than a 4 / 10 during or following activity) recommended and discussed.  Follow-up:  . Return in about 6 weeks (around 06/28/2018) for repeat diagnostic ultrasound.   . If any lack of improvement consider: Biologic Therapy with PRP and Or corticosteroid injections  . At follow up will plan to consider: Repeat MSK ultrasound     CMA/ATC served as scribe during this visit. History, Physical, and Plan performed by medical  provider. Documentation and orders reviewed and attested to.      Gerda Diss, [place] [facility] Physician

## 2018-07-05 ENCOUNTER — Encounter: Payer: Self-pay | Admitting: Sports Medicine

## 2018-07-05 ENCOUNTER — Ambulatory Visit: Payer: Self-pay

## 2018-07-05 ENCOUNTER — Ambulatory Visit: Payer: Medicare Other | Admitting: Sports Medicine

## 2018-07-05 VITALS — BP 122/82 | HR 60 | Ht 69.5 in | Wt 235.8 lb

## 2018-07-05 DIAGNOSIS — M7711 Lateral epicondylitis, right elbow: Secondary | ICD-10-CM

## 2018-07-05 DIAGNOSIS — M7701 Medial epicondylitis, right elbow: Secondary | ICD-10-CM

## 2018-07-05 DIAGNOSIS — R972 Elevated prostate specific antigen [PSA]: Secondary | ICD-10-CM | POA: Diagnosis not present

## 2018-07-05 DIAGNOSIS — R311 Benign essential microscopic hematuria: Secondary | ICD-10-CM | POA: Diagnosis not present

## 2018-07-05 DIAGNOSIS — E291 Testicular hypofunction: Secondary | ICD-10-CM | POA: Diagnosis not present

## 2018-07-05 NOTE — Procedures (Signed)
LIMITED MSK ULTRASOUND OF Right elbow Images were obtained and interpreted by myself, Teresa Coombs, DO  Images have been saved and stored to PACS system. Images obtained on: GE S7 Ultrasound machine  FINDINGS:   Lateral epicondyle with hypoechoic change over the common extensor tendon with fragmentation of the proximal/superior component.    Increased neovascularity within the common extensor tendon with persistent but improved hypoechoic changes.   Improved hypoechoic change over the common flexor tendons.    IMPRESSION:  1. Improving medial and lateral epicondylosis small bony fragment lateral epicondyle.  Improved neovascularity

## 2018-07-05 NOTE — Progress Notes (Signed)
Juanda Bond. Charissa Knowles, [place] at [facility] - 77 y.o. male MRN [id number]  Date of birth: Feb 19, 1941  Visit Date:   PCP: Dorothyann Peng, NP   Referred by: Dorothyann Peng, NP   SUBJECTIVE:  Daniel Hampton is here for f/u R elbow (Nitro Protocol, IBU 600 mg, HEP (wrist stretch, ext ecc, pron/sup))   HPI: Patient is here for 6-week follow-up of his right elbow pain for both medial and lateral epicondylosis.  He is doing quite well and has only mild pain at this time.  Denies any burning swelling numbness or weakness.  The radiation down his arm has resolved.  He has been able to perform yard work including blowing leaves with a blower without significant exacerbation.  He is otherwise had improvement with rest and occasional ibuprofen he is overdone it.  No adverse effects to the nitroglycerin protocol.  He is performing the home exercise program previously prescribed.  REVIEW OF SYSTEMS: Denies night time disturbances. Denies fevers, chills, or night sweats. Denies unexplained weight loss. Reports personal history of cancer. Denies changes in bowel or bladder habits. Denies recent unreported falls. Denies new or worsening dyspnea or wheezing. Denies headaches or dizziness.  Denies numbness, tingling or weakness  In the extremities.  Denies dizziness or presyncopal episodes Denies lower extremity edema    HISTORY:  Prior history reviewed and updated per electronic medical record.  Social History   Occupational History  . Occupation: Retired  Tobacco Use  . Smoking status: Former Smoker    Packs/day: 1.00    Years: 20.00    Pack years: 20.00    Types: Cigarettes    Last attempt to quit: 10/30/1978    Years since quitting: 39.7  . Smokeless tobacco: Never Used  Substance and Sexual Activity  . Alcohol use: Yes  . Drug use: No  . Sexual activity: Not on file   Social History   Social History Narrative   Retired   Two daughters   Married      DATA OBTAINED & REVIEWED:   Recent Labs    10/22/17 0837  CALCIUM 9.5  AST 36  ALT 63*  TSH 2.12   No problems updated. No specialty comments available.  OBJECTIVE:  VS:  HT:5' 9.5" (176.5 cm)   WT:235 lb 12.8 oz (107 kg)  BMI:34.33    BP:122/82  HR:60bpm  TEMP: ( )  RESP:95 %   PHYSICAL EXAM: CONSTITUTIONAL: Well-developed, Well-nourished and In no acute distress PSYCHIATRIC : Alert & appropriately interactive. and Not depressed or anxious appearing. RESPIRATORY : No increased work of breathing and Trachea Midline EYES : Pupils are equal., EOM intact without nystagmus. and No scleral icterus.  VASCULAR EXAM : Warm and well perfused NEURO: unremarkable  MSK Exam:  Right elbow  Well aligned, no significant deformity. No overlying skin changes. TTP over common extensor tendon and lateral epicondyle No swelling No effusion normal flexion/extension Ligamentously stable Normal, non-painful: resistented wrist extension     ASSESSMENT  1. Lateral epicondylitis of right elbow   2. Medial epicondylitis of elbow, right     PLAN:  Pertinent additional documentation may be included in corresponding procedure notes, imaging studies, problem based documentation and patient instructions.  Procedures:  . None  Medications:  No orders of the defined types were placed in this encounter.  Discussion/Instructions: No problem-specific Assessment & Plan notes found for this encounter.  Marland Kitchen He will add an  additional one quarter of nitroglycerin patch to the medial elbow and continue with a quarter of the patch over the lateral elbow as well.  Continue with home exercise program.  He will slowly return to golf as tolerated. Discussed red flag symptoms that warrant earlier emergent evaluation and patient voices understanding. Activity modifications and the importance of avoiding exacerbating activities (limiting pain to no more than a 4  / 10 during or following activity) recommended and discussed.  Follow-up:  . Return in about 6 weeks (around 08/16/2018) for repeat diagnostic ultrasound.  . If any lack of improvement: consider Biologic Therapy with PRP and Or could consider steroid injection but trying to avoid this at this time.         Gerda Diss, [place] [facility] Physician

## 2018-07-08 DIAGNOSIS — N281 Cyst of kidney, acquired: Secondary | ICD-10-CM | POA: Diagnosis not present

## 2018-07-08 DIAGNOSIS — R311 Benign essential microscopic hematuria: Secondary | ICD-10-CM | POA: Diagnosis not present

## 2018-08-02 DIAGNOSIS — R972 Elevated prostate specific antigen [PSA]: Secondary | ICD-10-CM | POA: Diagnosis not present

## 2018-08-02 LAB — PSA: PSA: 5.31

## 2018-08-16 ENCOUNTER — Ambulatory Visit: Payer: Self-pay

## 2018-08-16 ENCOUNTER — Encounter: Payer: Self-pay | Admitting: Sports Medicine

## 2018-08-16 ENCOUNTER — Ambulatory Visit: Payer: Medicare Other | Admitting: Sports Medicine

## 2018-08-16 VITALS — BP 130/80 | HR 61 | Ht 69.5 in | Wt 239.8 lb

## 2018-08-16 DIAGNOSIS — M7711 Lateral epicondylitis, right elbow: Secondary | ICD-10-CM | POA: Diagnosis not present

## 2018-08-16 DIAGNOSIS — M7701 Medial epicondylitis, right elbow: Secondary | ICD-10-CM

## 2018-08-16 MED ORDER — NITROGLYCERIN 0.2 MG/HR TD PT24: MEDICATED_PATCH | TRANSDERMAL | 1 refills | Status: DC

## 2018-08-16 NOTE — Progress Notes (Signed)
Juanda Bond. Rigby, [place] at [facility] - 78 y.o. male MRN [id number]  Date of birth: [birth year]  Visit Date: August 16, 2018  PCP: Dorothyann Peng, NP   Referred by: Dorothyann Peng, NP  SUBJECTIVE:  Chief Complaint  Patient presents with  . f/u R elbow    Following Nitrp Protocol. Using counterforce strap. Provided with HEP.     HPI: Patient is here for follow-up of his right elbow pain.  Reports overall 75% improvement from the initial onset.  He has been avoiding some exacerbating activities such as golf but has been able to return to yardwork and using the blower.  He is using the counterforce strap as well as diligent home exercise program and nitroglycerin protocol.  He does have some headaches with the nitroglycerin but this is minimal.  REVIEW OF SYSTEMS: Per HPI  HISTORY:  Prior history reviewed and updated per electronic medical record.  Social History   Occupational History  . Occupation: Retired  Tobacco Use  . Smoking status: Former Smoker    Packs/day: 1.00    Years: 20.00    Pack years: 20.00    Types: Cigarettes    Last attempt to quit: 10/30/1978    Years since quitting: 39.8  . Smokeless tobacco: Never Used  Substance and Sexual Activity  . Alcohol use: Yes  . Drug use: No  . Sexual activity: Not on file   Social History   Social History Narrative   Retired   Two daughters   Married      DATA OBTAINED & REVIEWED:  Recent Labs    10/22/17 0837  CALCIUM 9.5  AST 36  ALT 63*  TSH 2.12   No problems updated. No specialty comments available.  OBJECTIVE:  VS:  HT:5' 9.5" (176.5 cm)   WT:239 lb 12.8 oz (108.8 kg)  BMI:34.92    BP:130/80  HR:61bpm  TEMP: ( )  RESP:95 %   PHYSICAL EXAM: Adult male.  No acute distress.  Alert and appropriately interactive.  Bilateral arms are overall well aligned.  He has good flexion extension of his elbows with intact wrist  flexion and extension strength.  He does have some pain with palpation over the common extensor tendon as well as over the lateral epicondyle but this is significantly improved.  ASSESSMENT  1. Lateral epicondylitis of right elbow   2. Medial epicondylitis of elbow, right     PLAN:  Pertinent additional documentation may be included in corresponding procedure notes, imaging studies, problem based documentation and patient instructions. No problem-specific Assessment & Plan notes found for this encounter.  This is improving.  Reports 70 to 80% better.  He will continue with the nitroglycerin protocol for an additional 30 to 60 days whilst at home therapeutic exercise  We did discuss referral to physical therapy for consideration of dry needling into the common extensor musculature Given how tender he is over these and how tight they are.  Procedures:  None  Activity modifications and the importance of avoiding exacerbating activities (limiting pain to no more than a 4 / 10 during or following activity) recommended and discussed. Discussed red flag symptoms that warrant earlier emergent evaluation and patient voices understanding.  If any lack of improvement: consider Biologic Therapy with PRP   Return if symptoms worsen or fail to improve.          Gerda Diss, DO    [facility]  Sports Medicine Physician

## 2018-08-16 NOTE — Procedures (Signed)
LIMITED MSK ULTRASOUND OF Right elbow Images were obtained and interpreted by myself, Teresa Coombs, DO  Images have been saved and stored to PACS system. Images obtained on: GE S7 Ultrasound machine  FINDINGS:   Lateral condyle does have fragmentation at the superior portion with hypoechoic change within the common extensor tendon origin.  There is increased neovascularity that is improved compared to prior evaluation.  IMPRESSION:  1. Healing lateral epicondylosis

## 2018-08-17 ENCOUNTER — Encounter: Payer: Self-pay | Admitting: Sports Medicine

## 2018-08-17 DIAGNOSIS — D4100 Neoplasm of uncertain behavior of unspecified kidney: Secondary | ICD-10-CM | POA: Diagnosis not present

## 2018-08-17 DIAGNOSIS — R972 Elevated prostate specific antigen [PSA]: Secondary | ICD-10-CM | POA: Diagnosis not present

## 2018-08-17 DIAGNOSIS — R311 Benign essential microscopic hematuria: Secondary | ICD-10-CM | POA: Diagnosis not present

## 2018-08-17 DIAGNOSIS — E291 Testicular hypofunction: Secondary | ICD-10-CM | POA: Diagnosis not present

## 2018-08-19 ENCOUNTER — Other Ambulatory Visit: Payer: Self-pay | Admitting: Family Medicine

## 2018-08-19 MED ORDER — ATORVASTATIN CALCIUM 40 MG PO TABS: 40.0000 mg | ORAL_TABLET | Freq: Every day | ORAL | 0 refills | Status: DC

## 2018-08-19 NOTE — Telephone Encounter (Signed)
Sent to the pharmacy by e-scribe for 90 days.  Nothing further needed. 

## 2018-09-24 ENCOUNTER — Other Ambulatory Visit: Payer: Self-pay | Admitting: Urology

## 2018-09-24 DIAGNOSIS — D4120 Neoplasm of uncertain behavior of unspecified ureter: Principal | ICD-10-CM

## 2018-09-24 DIAGNOSIS — D4100 Neoplasm of uncertain behavior of unspecified kidney: Secondary | ICD-10-CM

## 2018-10-07 ENCOUNTER — Encounter: Payer: Self-pay | Admitting: Family Medicine

## 2018-10-25 ENCOUNTER — Other Ambulatory Visit: Payer: Medicare Other

## 2018-10-27 ENCOUNTER — Other Ambulatory Visit: Payer: Self-pay

## 2018-10-27 ENCOUNTER — Ambulatory Visit
Admission: RE | Admit: 2018-10-27 | Discharge: 2018-10-27 | Disposition: A | Discharge status: Home / Self Care | Payer: Medicare Other | Source: Ambulatory Visit | Attending: Urology | Admitting: Urology

## 2018-10-27 DIAGNOSIS — D4120 Neoplasm of uncertain behavior of unspecified ureter: Principal | ICD-10-CM

## 2018-10-27 DIAGNOSIS — D4100 Neoplasm of uncertain behavior of unspecified kidney: Secondary | ICD-10-CM | POA: Diagnosis not present

## 2018-10-27 MED ORDER — GADOBENATE DIMEGLUMINE 529 MG/ML IV SOLN
20.0000 mL | Freq: Once | INTRAVENOUS | Status: AC | PRN
  Administered 2018-10-27: 20 mL via INTRAVENOUS

## 2018-11-04 ENCOUNTER — Encounter: Payer: Medicare Other | Admitting: Adult Health

## 2018-11-22 ENCOUNTER — Other Ambulatory Visit: Payer: Self-pay | Admitting: Adult Health

## 2018-11-23 NOTE — Telephone Encounter (Signed)
Sent to the pharmacy by e-scribe for 90 days. 

## 2018-12-20 DIAGNOSIS — L821 Other seborrheic keratosis: Secondary | ICD-10-CM | POA: Diagnosis not present

## 2018-12-20 DIAGNOSIS — L738 Other specified follicular disorders: Secondary | ICD-10-CM | POA: Diagnosis not present

## 2018-12-20 DIAGNOSIS — D229 Melanocytic nevi, unspecified: Secondary | ICD-10-CM | POA: Diagnosis not present

## 2018-12-22 ENCOUNTER — Encounter: Payer: Self-pay | Admitting: Internal Medicine

## 2018-12-23 ENCOUNTER — Ambulatory Visit (INDEPENDENT_AMBULATORY_CARE_PROVIDER_SITE_OTHER): Payer: Medicare Other | Admitting: Adult Health

## 2018-12-23 ENCOUNTER — Other Ambulatory Visit: Payer: Self-pay

## 2018-12-23 ENCOUNTER — Encounter: Payer: Self-pay | Admitting: Adult Health

## 2018-12-23 VITALS — BP 118/70 | Temp 98.1°F | Ht 70.0 in | Wt 238.0 lb

## 2018-12-23 DIAGNOSIS — E785 Hyperlipidemia, unspecified: Secondary | ICD-10-CM

## 2018-12-23 DIAGNOSIS — Z0000 Encounter for general adult medical examination without abnormal findings: Secondary | ICD-10-CM

## 2018-12-23 DIAGNOSIS — N400 Enlarged prostate without lower urinary tract symptoms: Secondary | ICD-10-CM

## 2018-12-23 LAB — COMPREHENSIVE METABOLIC PANEL
ALT: 43 U/L (ref 0–53)
AST: 32 U/L (ref 0–37)
Albumin: 4.5 g/dL (ref 3.5–5.2)
Alkaline Phosphatase: 51 U/L (ref 39–117)
BUN: 16 mg/dL (ref 6–23)
CO2: 29 mEq/L (ref 19–32)
Calcium: 9 mg/dL (ref 8.4–10.5)
Chloride: 104 mEq/L (ref 96–112)
Creatinine, Ser: 1.1 mg/dL (ref 0.40–1.50)
GFR: 78.14 mL/min (ref >60.00)
Glucose, Bld: 99 mg/dL (ref 70–99)
Potassium: 4.7 mEq/L (ref 3.5–5.1)
Sodium: 143 mEq/L (ref 135–145)
Total Bilirubin: 0.8 mg/dL (ref 0.2–1.2)
Total Protein: 7.2 g/dL (ref 6.0–8.3)

## 2018-12-23 LAB — CBC WITH DIFFERENTIAL/PLATELET
Basophils Absolute: 0.1 10*3/uL (ref 0.0–0.1)
Basophils Relative: 0.7 % (ref 0.0–3.0)
Eosinophils Absolute: 0.2 10*3/uL (ref 0.0–0.7)
Eosinophils Relative: 2 % (ref 0.0–5.0)
HCT: 45.3 % (ref 39.0–52.0)
Hemoglobin: 15.9 g/dL (ref 13.0–17.0)
Lymphocytes Relative: 34.9 % (ref 12.0–46.0)
Lymphs Abs: 2.9 10*3/uL (ref 0.7–4.0)
MCHC: 35 g/dL (ref 30.0–36.0)
MCV: 90.9 fl (ref 78.0–100.0)
Monocytes Absolute: 0.5 10*3/uL (ref 0.1–1.0)
Monocytes Relative: 6.5 % (ref 3.0–12.0)
Neutro Abs: 4.7 10*3/uL (ref 1.4–7.7)
Neutrophils Relative %: 55.9 % (ref 43.0–77.0)
Platelets: 219 10*3/uL (ref 150.0–400.0)
RBC: 4.99 Mil/uL (ref 4.22–5.81)
RDW: 12.5 % (ref 11.5–15.5)
WBC: 8.4 10*3/uL (ref 4.0–10.5)

## 2018-12-23 LAB — TSH: TSH: 3.95 u[IU]/mL (ref 0.35–4.50)

## 2018-12-23 LAB — LIPID PANEL
Cholesterol: 183 mg/dL (ref 0–200)
HDL: 50.6 mg/dL (ref >39.00)
LDL Cholesterol: 98 mg/dL (ref 0–99)
NonHDL: 131.92
Total CHOL/HDL Ratio: 4
Triglycerides: 172 mg/dL — ABNORMAL HIGH (ref 0.0–149.0)
VLDL: 34.4 mg/dL (ref 0.0–40.0)

## 2018-12-23 NOTE — Progress Notes (Signed)
Subjective:    Patient ID: Daniel Hampton, male    DOB: 08-17-1940, 78 y.o.   MRN: [id number]  HPI Patient presents for yearly preventative medicine examination. Pleasant 78 year old male who  has a past medical history of Allergy, Asthma, COLONIC POLYPS, HX OF (03/12/2007), DISC DISEASE, LUMBAR (12/03/2008), DIVERTICULOSIS, COLON (10/17/2009), Hydrocele, HYPERLIPIDEMIA (03/12/2007), MORTON'S NEUROMA, RIGHT (03/13/2008), SKIN CANCER, HX OF (10/17/2009), and VERTIGO, BENIGN PAROXYSMAL POSITION (04/28/2007).  Hyperlipidemia - Takes Lipitor 40 mg daily and an 81 mg aspirin.  Denies myalgia Lab Results  Component Value Date   CHOL 186 10/22/2017   HDL 41.70 10/22/2017   LDLCALC 121 (H) 10/22/2017   LDLDIRECT 194.7 04/14/2012   TRIG 114.0 10/22/2017   CHOLHDL 4 10/22/2017   BPH - Is followed by Urology. Currently prescribed Flomax. His most recent PSA in December 2019 was 5.31. He had a MRI of the abdomen done in 10/2018  1. Simple and complex renal cysts. The indeterminate interpolar left renal lesion on prior CT is consistent with a hemorrhagic/proteinaceous cyst. No suspicious renal lesion. 2. Hepatic steatosis 3.  No acute abdominal process. 4.  Aortic Atherosclerosis (ICD10-I70.0).   All immunizations and health maintenance protocols were reviewed with the patient and needed orders were placed.  Appropriate screening laboratory values were ordered for the patient including screening of hyperlipidemia, renal function and hepatic function. If indicated by BPH, a PSA was ordered.  Medication reconciliation,  past medical history, social history, problem list and allergies were reviewed in detail with the patient  Goals were established with regard to weight loss, exercise, and  diet in compliance with medications. He has been playing golf sporadically and is walking around his neighborhood.    Wt Readings from Last 3 Encounters:  12/23/18 238 lb (108 kg)  08/16/18 239 lb 12.8 oz (108.8 kg)   07/05/18 235 lb 12.8 oz (107 kg)   End of life planning was discussed.  He is no longer in need of a colonoscopy due to age.  He is up-to-date on routine dental and vision screens  Review of Systems  Constitutional: Negative.   HENT: Negative.   Eyes: Negative.   Respiratory: Negative.   Cardiovascular: Negative.   Gastrointestinal: Negative.   Endocrine: Negative.   Genitourinary: Positive for frequency.  Musculoskeletal: Positive for arthralgias.  Skin: Negative.   Allergic/Immunologic: Negative.   Neurological: Negative.   Hematological: Negative.   Psychiatric/Behavioral: Negative.   All other systems reviewed and are negative.  Past Medical History:  Diagnosis Date  . Allergy    SEASONAL  . Asthma   . COLONIC POLYPS, HX OF 03/12/2007  . [place] DISEASE, LUMBAR 12/03/2008  . DIVERTICULOSIS, COLON 10/17/2009  . Hydrocele   . HYPERLIPIDEMIA 03/12/2007  . [place], RIGHT 03/13/2008  . SKIN CANCER, HX OF 10/17/2009  . VERTIGO, BENIGN PAROXYSMAL POSITION 04/28/2007    Social History   Socioeconomic History  . Marital status: Married    Spouse name: Not on file  . Number of children: 3  . Years of education: Not on file  . Highest education level: Not on file  Occupational History  . Occupation: Retired  Scientific laboratory technician  . Financial resource strain: Not on file  . Food insecurity:    Worry: Not on file    Inability: Not on file  . Transportation needs:    Medical: Not on file    Non-medical: Not on file  Tobacco Use  . Smoking status: Former Smoker  Packs/day: 1.00    Years: 20.00    Pack years: 20.00    Types: Cigarettes    Last attempt to quit: 10/30/1978    Years since quitting: 40.1  . Smokeless tobacco: Never Used  Substance and Sexual Activity  . Alcohol use: Yes  . Drug use: No  . Sexual activity: Not on file  Lifestyle  . Physical activity:    Days per week: Not on file    Minutes per session: Not on file  . Stress: Not on file  Relationships   . Social connections:    Talks on phone: Not on file    Gets together: Not on file    Attends religious service: Not on file    Active member of club or organization: Not on file    Attends meetings of clubs or organizations: Not on file    Relationship status: Not on file  . Intimate partner violence:    Fear of current or ex partner: Not on file    Emotionally abused: Not on file    Physically abused: Not on file    Forced sexual activity: Not on file  Other Topics Concern  . Not on file  Social History Narrative   Retired   Two daughters   Married     Past Surgical History:  Procedure Laterality Date  . COLONOSCOPY W/ BIOPSIES    . MOHS SURGERY    . SPINE SURGERY     lumbar disk surgery    Family History  Problem Relation Age of Onset  . Lung cancer Mother   . COPD Father   . Heart attack Father   . Alcohol abuse Brother   . Lung cancer Brother        smoker    No Known Allergies  Current Outpatient Medications on File Prior to Visit  Medication Sig Dispense Refill  . aspirin 81 MG tablet Take 81 mg by mouth daily.      Marland Kitchen atorvastatin (LIPITOR) 40 MG tablet TAKE ONE TABLET BY MOUTH ONE TIME DAILY  90 tablet 0  . ibuprofen (ADVIL,MOTRIN) 600 MG tablet Take 1 tablet (600 mg total) by mouth every 8 (eight) hours as needed. 30 tablet 0  . Multiple Vitamins-Minerals (MENS MULTI VITAMIN & MINERAL PO) Take by mouth daily.      . tamsulosin (FLOMAX) 0.4 MG CAPS capsule      No current facility-administered medications on file prior to visit.     BP 118/70   Temp 98.1 F (36.7 C)   Ht 5\' 10"  (1.778 m)   Wt 238 lb (108 kg)   BMI 34.15 kg/m      Objective:   Physical Exam Constitutional:      General: He is not in acute distress.    Appearance: He is well-developed. He is obese.  HENT:     Head: Normocephalic and atraumatic.     Right Ear: Tympanic membrane, ear canal and external ear normal. There is no impacted cerumen.     Left Ear: Tympanic membrane,  ear canal and external ear normal. There is no impacted cerumen.     Nose: Nose normal. No congestion or rhinorrhea.     Mouth/Throat:     Mouth: Mucous membranes are moist.     Pharynx: Oropharynx is clear. No oropharyngeal exudate or posterior oropharyngeal erythema.  Eyes:     General:        Right eye: No discharge.  Left eye: No discharge.     Conjunctiva/sclera: Conjunctivae normal.     Pupils: Pupils are equal, round, and reactive to light.  Neck:     Musculoskeletal: Normal range of motion and neck supple.     Trachea: No tracheal deviation.  Cardiovascular:     Rate and Rhythm: Normal rate and regular rhythm.     Pulses: Normal pulses.     Heart sounds: Normal heart sounds. No murmur. No friction rub. No gallop.   Pulmonary:     Effort: Pulmonary effort is normal. No respiratory distress.     Breath sounds: Normal breath sounds. No stridor. No wheezing, rhonchi or rales.  Chest:     Chest wall: No tenderness.  Abdominal:     General: Abdomen is flat. Bowel sounds are normal. There is no distension.     Palpations: Abdomen is soft. There is no mass.     Tenderness: There is no abdominal tenderness. There is no right CVA tenderness, left CVA tenderness, guarding or rebound.     Hernia: No hernia is present.  Musculoskeletal: Normal range of motion.        General: No swelling, tenderness, deformity or signs of injury.     Right lower leg: No edema.     Left lower leg: No edema.  Lymphadenopathy:     Cervical: No cervical adenopathy.  Skin:    General: Skin is warm and dry.     Capillary Refill: Capillary refill takes less than 2 seconds.     Findings: No erythema or rash.  Neurological:     General: No focal deficit present.     Mental Status: He is alert and oriented to person, place, and time. Mental status is at baseline.     Cranial Nerves: No cranial nerve deficit.     Sensory: No sensory deficit.     Motor: No weakness.     Coordination: Coordination  normal.     Gait: Gait normal.     Deep Tendon Reflexes: Reflexes normal.  Psychiatric:        Mood and Affect: Mood normal.        Behavior: Behavior normal.        Thought Content: Thought content normal.        Judgment: Judgment normal.       Assessment & Plan:  1. Routine general medical examination at a health care facility - Work on weight loss through diet and exercise - Follow up in one year or sooner if needed - CBC with Differential/Platelet - Comprehensive metabolic panel - Lipid panel - TSH  2. Hyperlipidemia, unspecified hyperlipidemia type - Consider dose change in Lipitor  - Continue with ASA - CBC with Differential/Platelet - Comprehensive metabolic panel - Lipid panel - TSH  3. Benign prostatic hyperplasia without lower urinary tract symptoms   Dorothyann Peng, NP

## 2018-12-24 DIAGNOSIS — H5203 Hypermetropia, bilateral: Secondary | ICD-10-CM | POA: Diagnosis not present

## 2019-02-15 ENCOUNTER — Ambulatory Visit (INDEPENDENT_AMBULATORY_CARE_PROVIDER_SITE_OTHER): Payer: Medicare Other | Admitting: Adult Health

## 2019-02-15 ENCOUNTER — Other Ambulatory Visit: Payer: Self-pay

## 2019-02-15 ENCOUNTER — Encounter: Payer: Self-pay | Admitting: Adult Health

## 2019-02-15 VITALS — BP 124/64 | Temp 98.5°F | Wt 231.0 lb

## 2019-02-15 DIAGNOSIS — M25511 Pain in right shoulder: Secondary | ICD-10-CM | POA: Diagnosis not present

## 2019-02-15 DIAGNOSIS — M25512 Pain in left shoulder: Secondary | ICD-10-CM

## 2019-02-15 MED ORDER — METHYLPREDNISOLONE ACETATE 80 MG/ML IJ SUSP
80.0000 mg | Freq: Once | INTRAMUSCULAR | Status: AC
  Administered 2019-02-15: 80 mg via INTRA_ARTICULAR

## 2019-02-15 NOTE — Progress Notes (Signed)
Subjective:    Patient ID: Daniel Hampton, male    DOB: [birth year], 78 y.o.   MRN: [id number]  HPI  78 year old male who  has a past medical history of Allergy, Asthma, COLONIC POLYPS, HX OF (03/12/2007), DISC DISEASE, LUMBAR (12/03/2008), DIVERTICULOSIS, COLON (10/17/2009), Hydrocele, HYPERLIPIDEMIA (03/12/2007), MORTON'S NEUROMA, RIGHT (03/13/2008), SKIN CANCER, HX OF (10/17/2009), and VERTIGO, BENIGN PAROXYSMAL POSITION (04/28/2007).  He presents to the office today for an acute complaint of bilateral shoulder pain.  Pain has been present for approximately 3 weeks L>R.  Discomfort started with the left shoulder and over the last 3 or 4 weeks his right shoulder is also started to become painful.  Has been taking over-the-counter NSAIDs which help with the relief but he does not want to take these long-term.  He has started noticing decreased range of motion in the left shoulder has no loss of range of motion in the right shoulder.  He does play a lot of golf which she believes has caused the pain. He denies redness, warmth, or edema   Review of Systems See HPI   Past Medical History:  Diagnosis Date  . Allergy    SEASONAL  . Asthma   . COLONIC POLYPS, HX OF 03/12/2007  . [place] DISEASE, LUMBAR 12/03/2008  . DIVERTICULOSIS, COLON 10/17/2009  . Hydrocele   . HYPERLIPIDEMIA 03/12/2007  . [place], RIGHT 03/13/2008  . SKIN CANCER, HX OF 10/17/2009  . VERTIGO, BENIGN PAROXYSMAL POSITION 04/28/2007    Social History   Socioeconomic History  . Marital status: Married    Spouse name: Not on file  . Number of children: 3  . Years of education: Not on file  . Highest education level: Not on file  Occupational History  . Occupation: Retired  Scientific laboratory technician  . Financial resource strain: Not on file  . Food insecurity    Worry: Not on file    Inability: Not on file  . Transportation needs    Medical: Not on file    Non-medical: Not on file  Tobacco Use  . Smoking status: Former Smoker   Packs/day: 1.00    Years: 20.00    Pack years: 20.00    Types: Cigarettes    Quit date: 10/30/1978    Years since quitting: 40.3  . Smokeless tobacco: Never Used  Substance and Sexual Activity  . Alcohol use: Yes  . Drug use: No  . Sexual activity: Not on file  Lifestyle  . Physical activity    Days per week: Not on file    Minutes per session: Not on file  . Stress: Not on file  Relationships  . Social Herbalist on phone: Not on file    Gets together: Not on file    Attends religious service: Not on file    Active member of club or organization: Not on file    Attends meetings of clubs or organizations: Not on file    Relationship status: Not on file  . Intimate partner violence    Fear of current or ex partner: Not on file    Emotionally abused: Not on file    Physically abused: Not on file    Forced sexual activity: Not on file  Other Topics Concern  . Not on file  Social History Narrative   Retired   Two daughters   Married     Past Surgical History:  Procedure Laterality Date  . COLONOSCOPY W/ BIOPSIES    .  MOHS SURGERY    . SPINE SURGERY     lumbar disk surgery    Family History  Problem Relation Age of Onset  . Lung cancer Mother   . COPD Father   . Heart attack Father   . Alcohol abuse Brother   . Lung cancer Brother        smoker    No Known Allergies  Current Outpatient Medications on File Prior to Visit  Medication Sig Dispense Refill  . aspirin 81 MG tablet Take 81 mg by mouth daily.      Marland Kitchen atorvastatin (LIPITOR) 40 MG tablet TAKE ONE TABLET BY MOUTH ONE TIME DAILY  90 tablet 0  . ibuprofen (ADVIL,MOTRIN) 600 MG tablet Take 1 tablet (600 mg total) by mouth every 8 (eight) hours as needed. 30 tablet 0  . Multiple Vitamins-Minerals (MENS MULTI VITAMIN & MINERAL PO) Take by mouth daily.      . tamsulosin (FLOMAX) 0.4 MG CAPS capsule      No current facility-administered medications on file prior to visit.     BP 124/64   Temp  98.5 F (36.9 C)   Wt 231 lb (104.8 kg)   BMI 33.15 kg/m       Objective:   Physical Exam Vitals signs and nursing note reviewed.  Constitutional:      Appearance: Normal appearance.  Cardiovascular:     Rate and Rhythm: Normal rate and regular rhythm.     Pulses: Normal pulses.     Heart sounds: Normal heart sounds.  Musculoskeletal:     Comments: He has full range of motion in right shoulder and no pain with palpation.  Unable to raise left arm overhead, unable to perform back scratch test and has discomfort in left shoulder with abduction and abbuction against resistance  Skin:    General: Skin is warm and dry.  Neurological:     General: No focal deficit present.     Mental Status: He is alert and oriented to person, place, and time.  Psychiatric:        Mood and Affect: Mood normal.        Behavior: Behavior normal.        Thought Content: Thought content normal.        Judgment: Judgment normal.        Assessment & Plan:  1. Acute pain of left shoulder -Discussed treatment options ultimately decided on Depo-Medrol injection.  Also refer to sports medicine for concern of rotator cuff injury and left shoulder. - methylPREDNISolone acetate (DEPO-MEDROL) injection 80 mg - Ambulatory referral to Sports Medicine  Discussed risks and benefits of corticosteroid injection and patient consented.  After prepping skin with betadine, injected 80 mg depomedrol and 2 cc of plain xylocaine with 22 gauge one and one half inch needle using the posterior approach and pt tolerated well.  2. Acute pain of right shoulder - No concern for rotator cuff injury.  - Likely arthritic pain  - can take NSAID as needed  Dorothyann Peng, NP

## 2019-02-21 NOTE — Progress Notes (Signed)
[facility] [place],  [address] Phone: [phone] Subjective:   I Daniel Hampton am serving as a Education administrator for Dr. Hulan Saas.  I'm seeing this patient by the request  of: Nafziger, Tommi Rumps, NP   CC: Left shoulder pain  DZH:[id number]  Daniel Hampton is a 78 y.o. male coming in with complaint of left shoulder pain. Pain wakes him up at night. Suspects arthritis. Doesn't remember any movements that causes pain. History of back surgery.  Patient is insensate shoulder causes more of a catching aspect and can wake him up at night.  He saw primary care provider and was given an injection.  States that the injection was somewhat helpful and then worsened again.  States that now still uncomfortable but does respond fairly well to ibuprofen.  Onset- 6-8 weeks  Location -lateral aspect of the shoulder Duration-  Character- achy  Aggravating factors-increasing movement laying on it at night Reliving factors-decrease in movement Therapies tried- Ibuprofen Severity-7 out of 10 in severity    Past Medical History:  Diagnosis Date  . Allergy    SEASONAL  . Asthma   . COLONIC POLYPS, HX OF 03/12/2007  . [place] DISEASE, LUMBAR 12/03/2008  . DIVERTICULOSIS, COLON 10/17/2009  . Hydrocele   . HYPERLIPIDEMIA 03/12/2007  . [place], RIGHT 03/13/2008  . SKIN CANCER, HX OF 10/17/2009  . VERTIGO, BENIGN PAROXYSMAL POSITION 04/28/2007   Past Surgical History:  Procedure Laterality Date  . COLONOSCOPY W/ BIOPSIES    . MOHS SURGERY    . SPINE SURGERY     lumbar disk surgery   Social History   Socioeconomic History  . Marital status: Married    Spouse name: Not on file  . Number of children: 3  . Years of education: Not on file  . Highest education level: Not on file  Occupational History  . Occupation: Retired  Scientific laboratory technician  . Financial resource strain: Not on file  . Food insecurity    Worry: Not on file    Inability: Not on file  .  Transportation needs    Medical: Not on file    Non-medical: Not on file  Tobacco Use  . Smoking status: Former Smoker    Packs/day: 1.00    Years: 20.00    Pack years: 20.00    Types: Cigarettes    Quit date: 10/30/1978    Years since quitting: 40.3  . Smokeless tobacco: Never Used  Substance and Sexual Activity  . Alcohol use: Yes  . Drug use: No  . Sexual activity: Not on file  Lifestyle  . Physical activity    Days per week: Not on file    Minutes per session: Not on file  . Stress: Not on file  Relationships  . Social Herbalist on phone: Not on file    Gets together: Not on file    Attends religious service: Not on file    Active member of club or organization: Not on file    Attends meetings of clubs or organizations: Not on file    Relationship status: Not on file  Other Topics Concern  . Not on file  Social History Narrative   Retired   Two daughters   Married    No Known Allergies Family History  Problem Relation Age of Onset  . Lung cancer Mother   . COPD Father   . Heart attack Father   . Alcohol abuse Brother   .  Lung cancer Brother        smoker     Current Outpatient Medications (Cardiovascular):  .  atorvastatin (LIPITOR) 40 MG tablet, TAKE ONE TABLET BY MOUTH ONE TIME DAILY    Current Outpatient Medications (Analgesics):  .  aspirin 81 MG tablet, Take 81 mg by mouth daily.   Marland Kitchen  ibuprofen (ADVIL,MOTRIN) 600 MG tablet, Take 1 tablet (600 mg total) by mouth every 8 (eight) hours as needed.   Current Outpatient Medications (Other):  Marland Kitchen  Multiple Vitamins-Minerals (MENS MULTI VITAMIN & MINERAL PO), Take by mouth daily.   .  tamsulosin (FLOMAX) 0.4 MG CAPS capsule,     Past medical history, social, surgical and family history all reviewed in electronic medical record.  No pertanent information unless stated regarding to the chief complaint.   Review of Systems:  No headache, visual changes, nausea, vomiting, diarrhea, constipation,  dizziness, abdominal pain, skin rash, fevers, chills, night sweats, weight loss, swollen lymph nodes, body aches, joint swelling, , chest pain, shortness of breath, mood changes.  Positive muscle aches  Objective  Blood pressure (!) 142/74, pulse 62, height 5\' 10"  (1.778 m), weight 231 lb (104.8 kg), SpO2 97 %.   General: No apparent distress alert and oriented x3 mood and affect normal, dressed appropriately.  HEENT: Pupils equal, extraocular movements intact  Respiratory: Patient's speak in full sentences and does not appear short of breath  Cardiovascular: No lower extremity edema, non tender, no erythema  Skin: Warm dry intact with no signs of infection or rash on extremities or on axial skeleton.  Abdomen: Soft nontender  Neuro: Cranial nerves II through XII are intact, neurovascularly intact in all extremities with 2+ DTRs and 2+ pulses.  Lymph: No lymphadenopathy of posterior or anterior cervical chain or axillae bilaterally.  Gait mild antalgic MSK:  tender with full range of motion and good stability and symmetric strength and tone of , elbows, wrist, hip, knee and ankles bilaterally.  Left shoulder exam shows the patient does have a positive speeds test.  Positive impingement.  Rotator cuff strength of 5 out of 5.  Near full range of motion but mild pain with crossover.  Negative O'Brien's.  Good grip strength Contralateral shoulder unremarkable  MSK US performed of: Left shoulder This study was ordered, performed, and interpreted by Charlann Boxer D.O.  Shoulder:   Supraspinatus: Degenerative changes noted but no acute tear AC joint:  Capsule undistended, mild arthritic changes Glenohumeral Joint: without effusion.  Mild arthritic changes Biceps Tendon: Significant hypoechoic changes within the tendon sheath.  Mild increase in Doppler flow.  No true tearing though noted proximally or distally. Impression: Degenerative rotator cuff tear with biceps tendinitis    Impression and  Recommendations:     This case required medical decision making of moderate complexity. The above documentation has been reviewed and is accurate and complete Daniel Pulley, DO       Note: This dictation was prepared with Dragon dictation along with smaller phrase technology. Any transcriptional errors that result from this process are unintentional.

## 2019-02-22 ENCOUNTER — Encounter: Payer: Self-pay | Admitting: Family Medicine

## 2019-02-22 ENCOUNTER — Ambulatory Visit: Payer: Self-pay

## 2019-02-22 ENCOUNTER — Ambulatory Visit: Payer: Medicare Other | Admitting: Family Medicine

## 2019-02-22 ENCOUNTER — Other Ambulatory Visit: Payer: Self-pay

## 2019-02-22 VITALS — BP 142/74 | HR 62 | Ht 70.0 in | Wt 231.0 lb

## 2019-02-22 DIAGNOSIS — G8929 Other chronic pain: Secondary | ICD-10-CM

## 2019-02-22 DIAGNOSIS — M7522 Bicipital tendinitis, left shoulder: Secondary | ICD-10-CM | POA: Diagnosis not present

## 2019-02-22 DIAGNOSIS — M25512 Pain in left shoulder: Secondary | ICD-10-CM | POA: Diagnosis not present

## 2019-02-22 NOTE — Patient Instructions (Signed)
Good to see you Exercise 3 times a week Arm compression   pennsaid pinkie amount topically 2 times daily as needed.  See me again in 4-5 weeks

## 2019-02-22 NOTE — Assessment & Plan Note (Signed)
Bicep tendinitis with a partial mild degenerative rotator cuff tear noted.  Mild arthritic changes noted.  We discussed with patient in great length.  We will try compression sling, home exercise, topical anti-inflammatories and icing regimen.  Discussed decreasing range of motion in all planes.  Follow-up again in 4 to 6 weeks.  Worsening symptoms consider an injection into the bicep tendon.  Patient is in agreement with the plan.  Differential includes labral pathology but on exam today did not seem to be irritated.

## 2019-03-03 DIAGNOSIS — M9903 Segmental and somatic dysfunction of lumbar region: Secondary | ICD-10-CM | POA: Diagnosis not present

## 2019-03-03 DIAGNOSIS — M5441 Lumbago with sciatica, right side: Secondary | ICD-10-CM | POA: Diagnosis not present

## 2019-03-03 DIAGNOSIS — M9913 Subluxation complex (vertebral) of lumbar region: Secondary | ICD-10-CM | POA: Diagnosis not present

## 2019-03-03 DIAGNOSIS — M5136 Other intervertebral disc degeneration, lumbar region: Secondary | ICD-10-CM | POA: Diagnosis not present

## 2019-03-07 DIAGNOSIS — M5136 Other intervertebral disc degeneration, lumbar region: Secondary | ICD-10-CM | POA: Diagnosis not present

## 2019-03-07 DIAGNOSIS — M9903 Segmental and somatic dysfunction of lumbar region: Secondary | ICD-10-CM | POA: Diagnosis not present

## 2019-03-07 DIAGNOSIS — M9913 Subluxation complex (vertebral) of lumbar region: Secondary | ICD-10-CM | POA: Diagnosis not present

## 2019-03-07 DIAGNOSIS — M5441 Lumbago with sciatica, right side: Secondary | ICD-10-CM | POA: Diagnosis not present

## 2019-03-14 DIAGNOSIS — M5136 Other intervertebral disc degeneration, lumbar region: Secondary | ICD-10-CM | POA: Diagnosis not present

## 2019-03-14 DIAGNOSIS — M5441 Lumbago with sciatica, right side: Secondary | ICD-10-CM | POA: Diagnosis not present

## 2019-03-14 DIAGNOSIS — M9903 Segmental and somatic dysfunction of lumbar region: Secondary | ICD-10-CM | POA: Diagnosis not present

## 2019-03-14 DIAGNOSIS — M9913 Subluxation complex (vertebral) of lumbar region: Secondary | ICD-10-CM | POA: Diagnosis not present

## 2019-03-21 DIAGNOSIS — M5136 Other intervertebral disc degeneration, lumbar region: Secondary | ICD-10-CM | POA: Diagnosis not present

## 2019-03-21 DIAGNOSIS — M9913 Subluxation complex (vertebral) of lumbar region: Secondary | ICD-10-CM | POA: Diagnosis not present

## 2019-03-21 DIAGNOSIS — M9903 Segmental and somatic dysfunction of lumbar region: Secondary | ICD-10-CM | POA: Diagnosis not present

## 2019-03-21 DIAGNOSIS — M5441 Lumbago with sciatica, right side: Secondary | ICD-10-CM | POA: Diagnosis not present

## 2019-03-28 ENCOUNTER — Ambulatory Visit: Payer: Medicare Other | Admitting: Family Medicine

## 2019-03-28 DIAGNOSIS — M9913 Subluxation complex (vertebral) of lumbar region: Secondary | ICD-10-CM | POA: Diagnosis not present

## 2019-03-28 DIAGNOSIS — M5441 Lumbago with sciatica, right side: Secondary | ICD-10-CM | POA: Diagnosis not present

## 2019-03-28 DIAGNOSIS — M5136 Other intervertebral disc degeneration, lumbar region: Secondary | ICD-10-CM | POA: Diagnosis not present

## 2019-03-28 DIAGNOSIS — M9903 Segmental and somatic dysfunction of lumbar region: Secondary | ICD-10-CM | POA: Diagnosis not present

## 2019-04-21 ENCOUNTER — Telehealth: Payer: Self-pay | Admitting: Adult Health

## 2019-04-21 NOTE — Telephone Encounter (Signed)
Copied from [place] [id number]. Topic: Appointment Scheduling - Scheduling Inquiry for Clinic >> Apr 21, 2019 10:37 AM Reyne Dumas L wrote: Reason for CRM:  Pt would like to schedule an appointment this afternoon or tomorrow.  States he is having aches and pains in his arms and shoulders.  Tried office, no answer.   The patient would like to have an in office appointment either Friday or Wednesday.  Can you work him in please?  Please advise

## 2019-04-22 NOTE — Telephone Encounter (Signed)
Patient has been scheduled. Nothing further needed.  

## 2019-04-27 ENCOUNTER — Other Ambulatory Visit: Payer: Self-pay

## 2019-04-27 ENCOUNTER — Encounter: Payer: Self-pay | Admitting: Adult Health

## 2019-04-27 ENCOUNTER — Ambulatory Visit (INDEPENDENT_AMBULATORY_CARE_PROVIDER_SITE_OTHER): Payer: Medicare Other | Admitting: Adult Health

## 2019-04-27 VITALS — BP 140/90 | HR 71 | Temp 98.2°F | Wt 234.4 lb

## 2019-04-27 DIAGNOSIS — G8929 Other chronic pain: Secondary | ICD-10-CM | POA: Diagnosis not present

## 2019-04-27 DIAGNOSIS — M25511 Pain in right shoulder: Secondary | ICD-10-CM

## 2019-04-27 DIAGNOSIS — M25512 Pain in left shoulder: Secondary | ICD-10-CM

## 2019-04-27 MED ORDER — MELOXICAM 7.5 MG PO TABS: 7.5000 mg | ORAL_TABLET | Freq: Every day | ORAL | 0 refills | Status: DC

## 2019-04-27 NOTE — Progress Notes (Signed)
Subjective:    Patient ID: Daniel Hampton, male    DOB: [birth year], 78 y.o.   MRN: [id number]  HPI  78 year old male who  has a past medical history of Allergy, Asthma, COLONIC POLYPS, HX OF (03/12/2007), DISC DISEASE, LUMBAR (12/03/2008), DIVERTICULOSIS, COLON (10/17/2009), Hydrocele, HYPERLIPIDEMIA (03/12/2007), MORTON'S NEUROMA, RIGHT (03/13/2008), SKIN CANCER, HX OF (10/17/2009), and VERTIGO, BENIGN PAROXYSMAL POSITION (04/28/2007).  He presents to the office today for chronic bilateral shoulder pain and bicep tendinitis.  He was seen by Dr. Gardenia Phlegm in July 2020 and diagnosed with bicep tendinitis with a partial mild degenerative rotator cuff tear noted.  He tried a compression sling, home exercises, and topical anti-inflammatories with an icing regimen.  Reports that this did not help much so he decided to go back and see his acupuncturist.  Done about 8 visits with his acupuncturist and reports that he is about "70% improved".  Next visit is 3 days from now.  He has been using Aleve over-the-counter which helps with his symptoms in the meantime.  He was wondering if I would prescribe him something longer acting so he did not have to take Aleve throughout the day  Review of Systems See HPI   Past Medical History:  Diagnosis Date  . Allergy    SEASONAL  . Asthma   . COLONIC POLYPS, HX OF 03/12/2007  . [place] DISEASE, LUMBAR 12/03/2008  . DIVERTICULOSIS, COLON 10/17/2009  . Hydrocele   . HYPERLIPIDEMIA 03/12/2007  . [place], RIGHT 03/13/2008  . SKIN CANCER, HX OF 10/17/2009  . VERTIGO, BENIGN PAROXYSMAL POSITION 04/28/2007    Social History   Socioeconomic History  . Marital status: Married    Spouse name: Not on file  . Number of children: 3  . Years of education: Not on file  . Highest education level: Not on file  Occupational History  . Occupation: Retired  Scientific laboratory technician  . Financial resource strain: Not on file  . Food insecurity    Worry: Not on file    Inability: Not on file   . Transportation needs    Medical: Not on file    Non-medical: Not on file  Tobacco Use  . Smoking status: Former Smoker    Packs/day: 1.00    Years: 20.00    Pack years: 20.00    Types: Cigarettes    Quit date: 10/30/1978    Years since quitting: 40.5  . Smokeless tobacco: Never Used  Substance and Sexual Activity  . Alcohol use: Yes  . Drug use: No  . Sexual activity: Not on file  Lifestyle  . Physical activity    Days per week: Not on file    Minutes per session: Not on file  . Stress: Not on file  Relationships  . Social Herbalist on phone: Not on file    Gets together: Not on file    Attends religious service: Not on file    Active member of club or organization: Not on file    Attends meetings of clubs or organizations: Not on file    Relationship status: Not on file  . Intimate partner violence    Fear of current or ex partner: Not on file    Emotionally abused: Not on file    Physically abused: Not on file    Forced sexual activity: Not on file  Other Topics Concern  . Not on file  Social History Narrative   Retired   Two  daughters   Married     Past Surgical History:  Procedure Laterality Date  . COLONOSCOPY W/ BIOPSIES    . MOHS SURGERY    . SPINE SURGERY     lumbar disk surgery    Family History  Problem Relation Age of Onset  . Lung cancer Mother   . COPD Father   . Heart attack Father   . Alcohol abuse Brother   . Lung cancer Brother        smoker    No Known Allergies  Current Outpatient Medications on File Prior to Visit  Medication Sig Dispense Refill  . aspirin 81 MG tablet Take 81 mg by mouth daily.      Marland Kitchen atorvastatin (LIPITOR) 40 MG tablet TAKE ONE TABLET BY MOUTH ONE TIME DAILY  90 tablet 0  . Multiple Vitamins-Minerals (MENS MULTI VITAMIN & MINERAL PO) Take by mouth daily.      . tamsulosin (FLOMAX) 0.4 MG CAPS capsule      No current facility-administered medications on file prior to visit.     BP 140/90 (BP  Location: Left Arm, Patient Position: Sitting, Cuff Size: Large)   Pulse 71   Temp 98.2 F (36.8 C) (Temporal)   Wt 234 lb 6.4 oz (106.3 kg)   SpO2 97%   BMI 33.63 kg/m       Objective:   Physical Exam Vitals signs and nursing note reviewed.  Constitutional:      Appearance: Normal appearance.  Cardiovascular:     Rate and Rhythm: Normal rate and regular rhythm.     Pulses: Normal pulses.     Heart sounds: Normal heart sounds.  Pulmonary:     Effort: Pulmonary effort is normal.     Breath sounds: Normal breath sounds.  Musculoskeletal: Normal range of motion.        General: Tenderness present.  Neurological:     General: No focal deficit present.     Mental Status: He is alert and oriented to person, place, and time.  Psychiatric:        Mood and Affect: Mood normal.        Behavior: Behavior normal.        Thought Content: Thought content normal.        Judgment: Judgment normal.       Assessment & Plan:  1. Chronic pain of both shoulders -I am happy that acupuncture has worked for him.  Will trial Mobic 7.5 mg daily as needed.  He will follow-up as needed.  He is not to take any additional anti-inflammatory medications with this medication - meloxicam (MOBIC) 7.5 MG tablet; Take 1 tablet (7.5 mg total) by mouth daily.  Dispense: 30 tablet; Refill: 0   Dorothyann Peng, NP

## 2019-05-10 ENCOUNTER — Telehealth: Payer: Self-pay | Admitting: Family Medicine

## 2019-05-10 ENCOUNTER — Other Ambulatory Visit: Payer: Self-pay | Admitting: Adult Health

## 2019-05-10 ENCOUNTER — Ambulatory Visit (INDEPENDENT_AMBULATORY_CARE_PROVIDER_SITE_OTHER): Payer: Medicare Other

## 2019-05-10 ENCOUNTER — Other Ambulatory Visit: Payer: Self-pay

## 2019-05-10 DIAGNOSIS — Z23 Encounter for immunization: Secondary | ICD-10-CM | POA: Diagnosis not present

## 2019-05-10 MED ORDER — METHYLPREDNISOLONE 4 MG PO TBPK: ORAL_TABLET | ORAL | 0 refills | Status: DC

## 2019-05-10 NOTE — Progress Notes (Signed)
Mediation renewal

## 2019-05-10 NOTE — Telephone Encounter (Signed)
Abram notified to STOP Mobic and pick up new rx.  Nothing further needed.

## 2019-05-10 NOTE — Telephone Encounter (Signed)
He can stop mobic. Medrol dose pack sent into [facility]

## 2019-05-10 NOTE — Telephone Encounter (Signed)
Pt came in today for influenza vaccine.  He mentioned that pain in his left arm has not been relieved at all while taking Mobic.  Stated that Dr. Sherren Mocha has given him prednisone in the past.  Requesting a round of prednisone.  Please advise.

## 2019-05-11 ENCOUNTER — Encounter: Payer: Self-pay | Admitting: *Deleted

## 2019-06-03 ENCOUNTER — Other Ambulatory Visit: Payer: Self-pay | Admitting: Adult Health

## 2019-06-16 ENCOUNTER — Telehealth: Payer: Self-pay | Admitting: Family Medicine

## 2019-06-16 NOTE — Telephone Encounter (Signed)
Copied from [id number]. Topic: General - Other >> Jun 16, 2019 11:27 AM Rainey Pines A wrote: Patient is wanting a callback from [place] in regards to some inflammation in his neck that he has been seen for.  Please advise

## 2019-06-16 NOTE — Telephone Encounter (Signed)
Spoke to the pt.  Advised him of below message. Pt became frustrated and said that Dr. Tamala Julian did not mention any injections etc...the patient stated he was given 10 exercises to do and told to follow up in a few weeks.  By the patients own admission he did not follow up and does not plan to go back for further treatment.  Pt then disconnected the call.  [place] notified.  Will close note.

## 2019-06-16 NOTE — Telephone Encounter (Signed)
Left a message for a return call.

## 2019-06-16 NOTE — Telephone Encounter (Signed)
His symptoms are consistent with what Dr. Tamala Julian diagnosed him as. In Dr. Darliss Ridgel note, he was advised that if no improvement with conservative measures then follow up for discussion of steroid injections. I would have [name] do this

## 2019-06-16 NOTE — Telephone Encounter (Signed)
Spoke to the pt.  He continues to have inflammation in his neck and shoulders.  He has stopped the meloxicam and when he was prescribed prednisone.  Did say the 7.5 of meloxicam helped some.  Currently using Aleve but not really working.  Pt would like to know if he needs to see Tommi Rumps again or should another referral need to be done.  Feels as though Dr. Tamala Julian miss diagnosed him.

## 2019-07-19 ENCOUNTER — Other Ambulatory Visit: Payer: Self-pay | Admitting: Adult Health

## 2019-07-20 NOTE — Telephone Encounter (Signed)
Sent to the pharmacy by e-scribe. 

## 2019-08-26 ENCOUNTER — Ambulatory Visit: Payer: Medicare Other | Attending: Internal Medicine

## 2019-08-26 DIAGNOSIS — Z23 Encounter for immunization: Secondary | ICD-10-CM | POA: Insufficient documentation

## 2019-08-26 NOTE — Progress Notes (Signed)
   Covid-19 Vaccination Clinic  Name:  Daniel Hampton    MRN: [id number] DOB: Jan 07, 1941  08/26/2019  [name] was observed post Covid-19 immunization for 15 minutes without incidence. He was provided with Vaccine Information Sheet and instruction to access the V-Safe system.   [name] was instructed to call 911 with any severe reactions post vaccine: Marland Kitchen Difficulty breathing  . Swelling of your face and throat  . A fast heartbeat  . A bad rash all over your body  . Dizziness and weakness    Immunizations Administered    Name Date Dose VIS Date Route   Pfizer COVID-19 Vaccine 08/26/2019  6:28 PM 0.3 mL 07/15/2019 Intramuscular   Manufacturer: [place]   Lot: [id number]   [place]: [id number]

## 2019-09-13 ENCOUNTER — Ambulatory Visit: Payer: Medicare Other | Attending: Internal Medicine

## 2019-09-13 DIAGNOSIS — Z23 Encounter for immunization: Secondary | ICD-10-CM

## 2019-09-13 NOTE — Progress Notes (Signed)
   Covid-19 Vaccination Clinic  Name:  Daniel Hampton    MRN: [id number] DOB: 07-07-1941  09/13/2019  Daniel Hampton was observed post Covid-19 immunization for 15 minutes without incidence. He was provided with Vaccine Information Sheet and instruction to access the V-Safe system.   Daniel Hampton was instructed to call 911 with any severe reactions post vaccine: Marland Kitchen Difficulty breathing  . Swelling of your face and throat  . A fast heartbeat  . A bad rash all over your body  . Dizziness and weakness    Immunizations Administered    Name Date Dose VIS Date Route   Pfizer COVID-19 Vaccine 09/13/2019  8:30 AM 0.3 mL 07/15/2019 Intramuscular   Manufacturer: [place]   Lot: [id number]   [place]: [id number]

## 2019-09-29 ENCOUNTER — Ambulatory Visit
Admission: RE | Admit: 2019-09-29 | Discharge: 2019-09-29 | Disposition: A | Discharge status: Home / Self Care | Payer: Medicare Other | Source: Ambulatory Visit | Attending: Allergy and Immunology | Admitting: Allergy and Immunology

## 2019-09-29 ENCOUNTER — Other Ambulatory Visit: Payer: Self-pay | Admitting: Allergy and Immunology

## 2019-09-29 ENCOUNTER — Other Ambulatory Visit: Payer: Self-pay

## 2019-09-29 DIAGNOSIS — R05 Cough: Secondary | ICD-10-CM

## 2019-09-29 DIAGNOSIS — R059 Cough, unspecified: Secondary | ICD-10-CM

## 2019-09-29 DIAGNOSIS — J3089 Other allergic rhinitis: Secondary | ICD-10-CM | POA: Diagnosis not present

## 2019-09-29 DIAGNOSIS — J301 Allergic rhinitis due to pollen: Secondary | ICD-10-CM | POA: Diagnosis not present

## 2019-09-29 DIAGNOSIS — H1045 Other chronic allergic conjunctivitis: Secondary | ICD-10-CM | POA: Diagnosis not present

## 2019-10-05 ENCOUNTER — Encounter: Payer: Self-pay | Admitting: Family Medicine

## 2019-10-28 ENCOUNTER — Encounter: Payer: Self-pay | Admitting: Internal Medicine

## 2019-12-15 ENCOUNTER — Ambulatory Visit: Payer: Medicare Other | Admitting: Internal Medicine

## 2019-12-15 ENCOUNTER — Other Ambulatory Visit: Payer: Self-pay

## 2019-12-15 ENCOUNTER — Encounter: Payer: Self-pay | Admitting: Internal Medicine

## 2019-12-15 VITALS — BP 132/72 | HR 62 | Temp 97.5°F | Ht 70.0 in | Wt 239.4 lb

## 2019-12-15 DIAGNOSIS — Z86010 Personal history of colonic polyps: Secondary | ICD-10-CM | POA: Diagnosis not present

## 2019-12-15 DIAGNOSIS — K5730 Diverticulosis of large intestine without perforation or abscess without bleeding: Secondary | ICD-10-CM | POA: Diagnosis not present

## 2019-12-15 DIAGNOSIS — Z860101 Personal history of adenomatous and serrated colon polyps: Secondary | ICD-10-CM

## 2019-12-15 NOTE — Patient Instructions (Signed)
You have been scheduled for a colonoscopy. Please follow written instructions given to you at your visit today.  Please pick up your prep supplies at the pharmacy within the next 1-3 days. If you use inhalers (even only as needed), please bring them with you on the day of your procedure.   I appreciate the opportunity to care for you. Carl Gessner, MD, FACG 

## 2019-12-15 NOTE — Progress Notes (Signed)
Daniel Hampton 79 y.o. 1941/02/05 [id number]  Assessment & Plan:   Encounter Diagnoses  Name Primary?  Marland Kitchen Hx of adenomatous colonic polyps Yes  . Diverticulosis of colon without hemorrhage     We talked about the pros and cons of repeating a colonoscopy.  He is very interested in intent on doing this to make sure he does not have polyps and to prevent colon cancer.  He has some anxiety about this.  We will go ahead given that he is a very functional and overall fit 79 year old and repeat a colonoscopy.The risks and benefits as well as alternatives of endoscopic procedure(s) have been discussed and reviewed. All questions answered. The patient agrees to proceed.   I appreciate the opportunity to care for this patient. CC: Nafziger, Tommi Rumps, NP     Subjective:   Chief Complaint: History of colon polyps  HPI The patient is a 79 year old white man with long history of adenomatous colonic polyps, medical history of dyslipidemia skin cancer, osteoarthritis seasonal allergies and some asthma who is very functional overall and is here to discuss repeating a colonoscopy because a history of adenomatous colon polyps.  Recent colonoscopy history is with 2 diminutive adenomas in 2015 and a diminutive serrated adenoma/polyp 2010 and adenomas going back into the 1990s.  The patient says that he is afraid not to have a colonoscopy now.  He is concerned about developing cancer and prefers to repeat another colonoscopy as he is very functional, golfing 2 days a week active etc. No Known Allergies Current Meds  Medication Sig  . aspirin 81 MG tablet Take 81 mg by mouth daily.    Marland Kitchen atorvastatin (LIPITOR) 40 MG tablet TAKE ONE TABLET BY MOUTH ONE TIME DAILY   . fluticasone (FLONASE SENSIMIST) 27.5 MCG/SPRAY nasal spray Place 2 sprays into the nose daily.  . Multiple Vitamins-Minerals (MENS MULTI VITAMIN & MINERAL PO) Take by mouth daily.    . tamsulosin (FLOMAX) 0.4 MG CAPS capsule    Past Medical  History:  Diagnosis Date  . Allergy    SEASONAL  . Arthritis   . Asthma   . COLONIC POLYPS, HX OF 03/12/2007  . [place] DISEASE, LUMBAR 12/03/2008  . DIVERTICULOSIS, COLON 10/17/2009  . Hydrocele   . HYPERLIPIDEMIA 03/12/2007  . [place], RIGHT 03/13/2008  . SCC (squamous cell carcinoma) 04/26/2013   right hand-txpbx  . SKIN CANCER, HX OF 10/17/2009  . VERTIGO, BENIGN PAROXYSMAL POSITION 04/28/2007   Past Surgical History:  Procedure Laterality Date  . COLONOSCOPY W/ BIOPSIES    . MOHS SURGERY    . SPINE SURGERY     lumbar disk surgery   Social History   Social History Narrative   Retired from [employer] at [employer]   Enjoys golfing   Two daughters   Married    family history includes Alcohol abuse in his brother; Breast cancer in his daughter and daughter; COPD in his father; Heart attack in his father; Lung cancer in his brother and mother; Ovarian cancer in his mother.   Review of Systems Shoulder arthritis that did not improve with exercise but is getting better with fish oil supplements and acupuncture.  Objective:   Physical Exam BP 132/72   Pulse 62   Temp (!) 97.5 F (36.4 C)   Ht 5\' 10"  (1.778 m)   Wt 239 lb 6.4 oz (108.6 kg)   SpO2 96%   BMI 34.35 kg/m  No acute distress elderly white man Lungs  clear Normal heart sounds He is alert and oriented x3

## 2019-12-20 DIAGNOSIS — E291 Testicular hypofunction: Secondary | ICD-10-CM | POA: Diagnosis not present

## 2019-12-20 DIAGNOSIS — R972 Elevated prostate specific antigen [PSA]: Secondary | ICD-10-CM | POA: Diagnosis not present

## 2019-12-20 DIAGNOSIS — N281 Cyst of kidney, acquired: Secondary | ICD-10-CM | POA: Diagnosis not present

## 2020-01-13 DIAGNOSIS — H5203 Hypermetropia, bilateral: Secondary | ICD-10-CM | POA: Diagnosis not present

## 2020-01-17 NOTE — Patient Instructions (Addendum)
Blood work was ordered.    All other Health Maintenance issues reviewed.   All recommended immunizations and age-appropriate screenings are up-to-date or discussed.  No immunization administered today.   Medications reviewed and updated.  Changes include :   none    Please followup in 6 months    Health Maintenance, Male Adopting a healthy lifestyle and getting preventive care are important in promoting health and wellness. Ask your health care provider about:  The right schedule for you to have regular tests and exams.  Things you can do on your own to prevent diseases and keep yourself healthy. What should I know about diet, weight, and exercise? Eat a healthy diet   Eat a diet that includes plenty of vegetables, fruits, low-fat dairy products, and lean protein.  Do not eat a lot of foods that are high in solid fats, added sugars, or sodium. Maintain a healthy weight Body mass index (BMI) is a measurement that can be used to identify possible weight problems. It estimates body fat based on height and weight. Your health care provider can help determine your BMI and help you achieve or maintain a healthy weight. Get regular exercise Get regular exercise. This is one of the most important things you can do for your health. Most adults should:  Exercise for at least 150 minutes each week. The exercise should increase your heart rate and make you sweat (moderate-intensity exercise).  Do strengthening exercises at least twice a week. This is in addition to the moderate-intensity exercise.  Spend less time sitting. Even light physical activity can be beneficial. Watch cholesterol and blood lipids Have your blood tested for lipids and cholesterol at 80 years of age, then have this test every 5 years. You may need to have your cholesterol levels checked more often if:  Your lipid or cholesterol levels are high.  You are older than 80 years of age.  You are at high risk for  heart disease. What should I know about cancer screening? Many types of cancers can be detected early and may often be prevented. Depending on your health history and family history, you may need to have cancer screening at various ages. This may include screening for:  Colorectal cancer.  Prostate cancer.  Skin cancer.  Lung cancer. What should I know about heart disease, diabetes, and high blood pressure? Blood pressure and heart disease  High blood pressure causes heart disease and increases the risk of stroke. This is more likely to develop in people who have high blood pressure readings, are of African descent, or are overweight.  Talk with your health care provider about your target blood pressure readings.  Have your blood pressure checked: ? Every 3-5 years if you are 18-39 years of age. ? Every year if you are 40 years old or older.  If you are between the ages of 65 and 75 and are a current or former smoker, ask your health care provider if you should have a one-time screening for abdominal aortic aneurysm (AAA). Diabetes Have regular diabetes screenings. This checks your fasting blood sugar level. Have the screening done:  Once every three years after age 45 if you are at a normal weight and have a low risk for diabetes.  More often and at a younger age if you are overweight or have a high risk for diabetes. What should I know about preventing infection? Hepatitis B If you have a higher risk for hepatitis B, you should be screened for   this virus. Talk with your health care provider to find out if you are at risk for hepatitis B infection. Hepatitis C Blood testing is recommended for:  Everyone born from 1945 through 1965.  Anyone with known risk factors for hepatitis C. Sexually transmitted infections (STIs)  You should be screened each year for STIs, including gonorrhea and chlamydia, if: ? You are sexually active and are younger than 80 years of age. ? You are  older than 80 years of age and your health care provider tells you that you are at risk for this type of infection. ? Your sexual activity has changed since you were last screened, and you are at increased risk for chlamydia or gonorrhea. Ask your health care provider if you are at risk.  Ask your health care provider about whether you are at high risk for HIV. Your health care provider may recommend a prescription medicine to help prevent HIV infection. If you choose to take medicine to prevent HIV, you should first get tested for HIV. You should then be tested every 3 months for as long as you are taking the medicine. Follow these instructions at home: Lifestyle  Do not use any products that contain nicotine or tobacco, such as cigarettes, e-cigarettes, and chewing tobacco. If you need help quitting, ask your health care provider.  Do not use street drugs.  Do not share needles.  Ask your health care provider for help if you need support or information about quitting drugs. Alcohol use  Do not drink alcohol if your health care provider tells you not to drink.  If you drink alcohol: ? Limit how much you have to 0-2 drinks a day. ? Be aware of how much alcohol is in your drink. In the [place] one drink equals one 12 oz bottle of beer (355 mL), one 5 oz glass of wine (148 mL), or one 1 oz glass of hard liquor (44 mL). General instructions  Schedule regular health, dental, and eye exams.  Stay current with your vaccines.  Tell your health care provider if: ? You often feel depressed. ? You have ever been abused or do not feel safe at home. Summary  Adopting a healthy lifestyle and getting preventive care are important in promoting health and wellness.  Follow your health care provider's instructions about healthy diet, exercising, and getting tested or screened for diseases.  Follow your health care provider's instructions on monitoring your cholesterol and blood pressure. This  information is not intended to replace advice given to you by your health care provider. Make sure you discuss any questions you have with your health care provider. Document Revised: 07/14/2018 Document Reviewed: 07/14/2018 Elsevier Patient Education  2020 [employer]  

## 2020-01-17 NOTE — Progress Notes (Signed)
Subjective:    Patient ID: Daniel Hampton, male    DOB: 1941/02/29, 79 y.o.   MRN: [id number]  HPI  He is here to establish with a new pcp.   He is here for a physical exam.     He woke up one day with b/l shoulder pain and did a number of things and nothing helped.  He saw his PCP and sports medicine.  He stopped the statin and took some fish oil pills and it helped.  He still has some discomfort, but it is much better.  It tends to be worse in the morning and gets better during the day the more he does.    Medications and allergies reviewed with patient and updated if appropriate.  Patient Active Problem List   Diagnosis Date Noted  . Biceps tendinitis of left shoulder 02/22/2019  . Medial epicondylitis of elbow, right 05/17/2018  . Lateral epicondylitis of right elbow 05/17/2018  . Asthma flare 11/26/2010  . DIVERTICULOSIS, COLON 10/17/2009  . SKIN CANCER, HX OF 10/17/2009  . [place] DISEASE, LUMBAR 12/03/2008  . [place], RIGHT 03/13/2008  . VERTIGO, BENIGN PAROXYSMAL POSITION 04/28/2007  . Hyperlipidemia 03/12/2007  . COLONIC POLYPS, HX OF 03/12/2007    Current Outpatient Medications on File Prior to Visit  Medication Sig Dispense Refill  . aspirin 81 MG tablet Take 81 mg by mouth daily.      Marland Kitchen atorvastatin (LIPITOR) 40 MG tablet TAKE ONE TABLET BY MOUTH ONE TIME DAILY  90 tablet 1  . fluticasone (FLONASE SENSIMIST) 27.5 MCG/SPRAY nasal spray Place 2 sprays into the nose daily.    . Multiple Vitamins-Minerals (MENS MULTI VITAMIN & MINERAL PO) Take by mouth daily.       No current facility-administered medications on file prior to visit.    Past Medical History:  Diagnosis Date  . Allergy    SEASONAL  . Arthritis   . Asthma   . COLONIC POLYPS, HX OF 03/12/2007  . [place] DISEASE, LUMBAR 12/03/2008  . DIVERTICULOSIS, COLON 10/17/2009  . Hydrocele   . HYPERLIPIDEMIA 03/12/2007  . [place], RIGHT 03/13/2008  . SCC (squamous cell carcinoma) 04/26/2013   right  hand-[id number]  . SKIN CANCER, HX OF 10/17/2009  . VERTIGO, BENIGN PAROXYSMAL POSITION 04/28/2007    Past Surgical History:  Procedure Laterality Date  . COLONOSCOPY W/ BIOPSIES    . MOHS SURGERY    . SPINE SURGERY     lumbar disk surgery    Social History   Socioeconomic History  . Marital status: Married    Spouse name: Not on file  . Number of children: 3  . Years of education: Not on file  . Highest education level: Not on file  Occupational History  . Occupation: Retired  Tobacco Use  . Smoking status: Former Smoker    Packs/day: 1.00    Years: 20.00    Pack years: 20.00    Types: Cigarettes    Quit date: 10/30/1978    Years since quitting: 41.2  . Smokeless tobacco: Never Used  Substance and Sexual Activity  . Alcohol use: Yes  . Drug use: No  . Sexual activity: Not on file  Other Topics Concern  . Not on file  Social History Narrative   Retired from [employer] at [employer]   Enjoys golfing   Two daughters   Married    Social Determinants of Health   Financial Resource Strain:   . Difficulty of  Paying Living Expenses:   Food Insecurity:   . Worried About Charity fundraiser in the Last Year:   . Arboriculturist in the Last Year:   Transportation Needs:   . Film/video editor (Medical):   Marland Kitchen Lack of Transportation (Non-Medical):   Physical Activity:   . Days of Exercise per Week:   . Minutes of Exercise per Session:   Stress:   . Feeling of Stress :   Social Connections:   . Frequency of Communication with Friends and Family:   . Frequency of Social Gatherings with Friends and Family:   . Attends Religious Services:   . Active Member of Clubs or Organizations:   . Attends Archivist Meetings:   Marland Kitchen Marital Status:     Family History  Problem Relation Age of Onset  . Lung cancer Mother   . Ovarian cancer Mother   . COPD Father   . Heart attack Father   . Alcohol abuse Brother   . Lung cancer Brother        smoker  .  Breast cancer Daughter   . Breast cancer Daughter     Review of Systems  Constitutional: Negative for chills and fever.  HENT: Positive for postnasal drip.   Eyes: Negative for visual disturbance.  Respiratory: Positive for cough. Negative for shortness of breath and wheezing.   Cardiovascular: Positive for leg swelling (occ R foot). Negative for chest pain and palpitations.  Gastrointestinal: Negative for abdominal pain, blood in stool, constipation, diarrhea and nausea.       Rare gerd  Genitourinary: Negative for dysuria and hematuria.  Musculoskeletal: Positive for back pain (chronic back pain - controlled with exercise).  Neurological: Positive for dizziness (occ). Negative for light-headedness and headaches.  Psychiatric/Behavioral: Negative for dysphoric mood. The patient is not nervous/anxious.        Objective:   Vitals:   01/18/20 1014  BP: 130/70  Pulse: 71  Temp: 98 F (36.7 C)  SpO2: 97%   BP Readings from Last 3 Encounters:  01/18/20 130/70  12/15/19 132/72  04/27/19 140/90   Wt Readings from Last 3 Encounters:  01/18/20 235 lb (106.6 kg)  12/15/19 239 lb 6.4 oz (108.6 kg)  04/27/19 234 lb 6.4 oz (106.3 kg)   Body mass index is 33.72 kg/m.   Physical Exam    Constitutional: He appears well-developed and well-nourished. No distress.  HENT:  Head: Normocephalic and atraumatic.  Right Ear: External ear normal.  Left Ear: External ear normal.  Mouth/Throat: Oropharynx is clear and moist.  Normal ear canals and TM b/l  Eyes: Conjunctivae and EOM are normal.  Neck: Neck supple. No tracheal deviation present. No thyromegaly present.  No carotid bruit  Cardiovascular: Normal rate, regular rhythm, normal heart sounds and intact distal pulses.   1/6 systolic murmur heard. Pulmonary/Chest: Effort normal and breath sounds normal. No respiratory distress. He has no wheezes. He has no rales.  Abdominal: Soft. He exhibits no distension. There is no tenderness.    Genitourinary: deferred  Musculoskeletal: He exhibits no edema.  Lymphadenopathy:   He has no cervical adenopathy.  Skin: Skin is warm and dry. He is not diaphoretic.  Psychiatric: He has a normal mood and affect. His behavior is normal.     Assessment & Plan:     Physical exam: Screening blood work   ordered Immunizations   Up to date  Colonoscopy   - scheduled - Dr Carlean Purl Eye exams  Up to date  Exercise  Walks, golf Weight  overweight Skin   Sees derm annually Substance abuse      See Problem List for Assessment and Plan of chronic medical problems.    This visit occurred during the SARS-CoV-2 public health emergency.  Safety protocols were in place, including screening questions prior to the visit, additional usage of staff PPE, and extensive cleaning of exam room while observing appropriate contact time as indicated for disinfecting solutions.

## 2020-01-18 ENCOUNTER — Ambulatory Visit (INDEPENDENT_AMBULATORY_CARE_PROVIDER_SITE_OTHER): Payer: Medicare Other | Admitting: Internal Medicine

## 2020-01-18 ENCOUNTER — Encounter: Payer: Self-pay | Admitting: Internal Medicine

## 2020-01-18 ENCOUNTER — Other Ambulatory Visit: Payer: Self-pay

## 2020-01-18 VITALS — BP 130/70 | HR 71 | Temp 98.0°F | Ht 70.0 in | Wt 235.0 lb

## 2020-01-18 DIAGNOSIS — Z85828 Personal history of other malignant neoplasm of skin: Secondary | ICD-10-CM

## 2020-01-18 DIAGNOSIS — E782 Mixed hyperlipidemia: Secondary | ICD-10-CM

## 2020-01-18 DIAGNOSIS — I700 Atherosclerosis of aorta: Secondary | ICD-10-CM | POA: Insufficient documentation

## 2020-01-18 DIAGNOSIS — Z0000 Encounter for general adult medical examination without abnormal findings: Secondary | ICD-10-CM | POA: Diagnosis not present

## 2020-01-18 DIAGNOSIS — J309 Allergic rhinitis, unspecified: Secondary | ICD-10-CM | POA: Insufficient documentation

## 2020-01-18 LAB — LIPID PANEL
Cholesterol: 239 mg/dL — ABNORMAL HIGH (ref 0–200)
HDL: 45.7 mg/dL (ref >39.00)
NonHDL: 193.35
Total CHOL/HDL Ratio: 5
Triglycerides: 229 mg/dL — ABNORMAL HIGH (ref 0.0–149.0)
VLDL: 45.8 mg/dL — ABNORMAL HIGH (ref 0.0–40.0)

## 2020-01-18 LAB — COMPREHENSIVE METABOLIC PANEL
ALT: 32 U/L (ref 0–53)
AST: 27 U/L (ref 0–37)
Albumin: 4.6 g/dL (ref 3.5–5.2)
Alkaline Phosphatase: 50 U/L (ref 39–117)
BUN: 17 mg/dL (ref 6–23)
CO2: 27 mEq/L (ref 19–32)
Calcium: 9.7 mg/dL (ref 8.4–10.5)
Chloride: 104 mEq/L (ref 96–112)
Creatinine, Ser: 0.92 mg/dL (ref 0.40–1.50)
GFR: 95.77 mL/min (ref >60.00)
Glucose, Bld: 86 mg/dL (ref 70–99)
Potassium: 4.1 mEq/L (ref 3.5–5.1)
Sodium: 140 mEq/L (ref 135–145)
Total Bilirubin: 0.6 mg/dL (ref 0.2–1.2)
Total Protein: 7.3 g/dL (ref 6.0–8.3)

## 2020-01-18 LAB — CBC WITH DIFFERENTIAL/PLATELET
Basophils Absolute: 0.1 10*3/uL (ref 0.0–0.1)
Basophils Relative: 0.6 % (ref 0.0–3.0)
Eosinophils Absolute: 0.1 10*3/uL (ref 0.0–0.7)
Eosinophils Relative: 1.7 % (ref 0.0–5.0)
HCT: 42.7 % (ref 39.0–52.0)
Hemoglobin: 15 g/dL (ref 13.0–17.0)
Lymphocytes Relative: 38.1 % (ref 12.0–46.0)
Lymphs Abs: 3 10*3/uL (ref 0.7–4.0)
MCHC: 35.2 g/dL (ref 30.0–36.0)
MCV: 91.4 fl (ref 78.0–100.0)
Monocytes Absolute: 0.6 10*3/uL (ref 0.1–1.0)
Monocytes Relative: 7.4 % (ref 3.0–12.0)
Neutro Abs: 4.1 10*3/uL (ref 1.4–7.7)
Neutrophils Relative %: 52.2 % (ref 43.0–77.0)
Platelets: 204 10*3/uL (ref 150.0–400.0)
RBC: 4.67 Mil/uL (ref 4.22–5.81)
RDW: 13 % (ref 11.5–15.5)
WBC: 7.9 10*3/uL (ref 4.0–10.5)

## 2020-01-18 LAB — TSH: TSH: 2.8 u[IU]/mL (ref 0.35–4.50)

## 2020-01-18 LAB — LDL CHOLESTEROL, DIRECT: Direct LDL: 169 mg/dL

## 2020-01-18 MED ORDER — FISH OIL 1000 MG PO CAPS: ORAL_CAPSULE | ORAL | 0 refills | Status: DC

## 2020-01-18 NOTE — Assessment & Plan Note (Addendum)
Chronic Check lipid panel  Diet controlled Regular exercise and healthy diet encouraged

## 2020-01-18 NOTE — Assessment & Plan Note (Signed)
History of skin cancer Sees dermatology on a regular basis

## 2020-01-18 NOTE — Assessment & Plan Note (Signed)
Seen on prior MRI Stopped statin due to myalgia Will check cholesterol - try to restart statin

## 2020-01-19 ENCOUNTER — Encounter: Payer: Self-pay | Admitting: Internal Medicine

## 2020-01-23 ENCOUNTER — Encounter: Payer: Medicare Other | Admitting: Internal Medicine

## 2020-01-27 ENCOUNTER — Telehealth: Payer: Self-pay | Admitting: Internal Medicine

## 2020-01-27 MED ORDER — SIMVASTATIN 40 MG PO TABS: 40.0000 mg | ORAL_TABLET | Freq: Every day | ORAL | 1 refills | Status: DC

## 2020-01-27 NOTE — Telephone Encounter (Signed)
  Patient requesting Simvastatin be called to [facility] (see  lab result comments)

## 2020-01-27 NOTE — Telephone Encounter (Signed)
Prescription sent

## 2020-01-31 ENCOUNTER — Other Ambulatory Visit: Payer: Self-pay

## 2020-01-31 ENCOUNTER — Encounter: Payer: Self-pay | Admitting: Internal Medicine

## 2020-01-31 ENCOUNTER — Ambulatory Visit (AMBULATORY_SURGERY_CENTER): Payer: Medicare Other | Admitting: Internal Medicine

## 2020-01-31 VITALS — BP 112/58 | HR 51 | Temp 97.1°F | Resp 15 | Ht 70.0 in | Wt 235.0 lb

## 2020-01-31 DIAGNOSIS — D125 Benign neoplasm of sigmoid colon: Secondary | ICD-10-CM

## 2020-01-31 DIAGNOSIS — D120 Benign neoplasm of cecum: Secondary | ICD-10-CM

## 2020-01-31 DIAGNOSIS — Z86010 Personal history of colonic polyps: Secondary | ICD-10-CM

## 2020-01-31 DIAGNOSIS — D124 Benign neoplasm of descending colon: Secondary | ICD-10-CM | POA: Diagnosis not present

## 2020-01-31 DIAGNOSIS — D123 Benign neoplasm of transverse colon: Secondary | ICD-10-CM | POA: Diagnosis not present

## 2020-01-31 MED ORDER — SODIUM CHLORIDE 0.9 % IV SOLN: 500.0000 mL | Freq: Once | INTRAVENOUS | Status: DC

## 2020-01-31 NOTE — Progress Notes (Signed)
Called to room to assist during endoscopic procedure.  Patient ID and intended procedure confirmed with present staff. Received instructions for my participation in the procedure from the performing physician.  

## 2020-01-31 NOTE — Progress Notes (Signed)
History reviewed today  VS, CW  

## 2020-01-31 NOTE — Op Note (Signed)
[facility] Patient Name: Daniel Hampton Procedure Date: 01/31/2020 8:47 AM MRN: [id number] Endoscopist: Gatha Mayer , MD Age: 79 Referring MD:  Date of Birth: [birth year] Gender: Male Account #: [id number] Procedure:                Colonoscopy Indications:              Surveillance: Personal history of adenomatous                            polyps on last colonoscopy > 5 years ago Medicines:                Propofol per Anesthesia, Monitored Anesthesia Care Procedure:                Pre-Anesthesia Assessment:                           - Prior to the procedure, a History and Physical                            was performed, and patient medications and                            allergies were reviewed. The patient's tolerance of                            previous anesthesia was also reviewed. The risks                            and benefits of the procedure and the sedation                            options and risks were discussed with the patient.                            All questions were answered, and informed consent                            was obtained. Prior Anticoagulants: The patient has                            taken no previous anticoagulant or antiplatelet                            agents. ASA Grade Assessment: II - A patient with                            mild systemic disease. After reviewing the risks                            and benefits, the patient was deemed in                            satisfactory condition to undergo the procedure.  After obtaining informed consent, the colonoscope                            was passed under direct vision. Throughout the                            procedure, the patient's blood pressure, pulse, and                            oxygen saturations were monitored continuously. The                            Colonoscope was introduced through the anus and                             advanced to the the cecum, identified by                            appendiceal orifice and ileocecal valve. The                            colonoscopy was performed without difficulty. The                            patient tolerated the procedure well. The quality                            of the bowel preparation was good. The ileocecal                            valve, appendiceal orifice, and rectum were                            photographed. The bowel preparation used was                            Miralax via split dose instruction. Scope In: 8:51:08 AM Scope Out: 9:18:37 AM Scope Withdrawal Time: 0 hours 21 minutes 58 seconds  Total Procedure Duration: 0 hours 27 minutes 29 seconds  Findings:                 The perianal and digital rectal examinations were                            normal. Pertinent negatives include normal prostate                            (size, shape, and consistency).                           Ten sessile polyps were found in the sigmoid colon,                            descending colon, transverse colon and cecum. The  polyps were diminutive in size. These polyps were                            removed with a cold snare. Resection and retrieval                            were complete. Verification of patient                            identification for the specimen was done. Estimated                            blood loss was minimal.                           Many small and large-mouthed diverticula were found                            in the sigmoid colon, descending colon, transverse                            colon and ascending colon.                           The exam was otherwise without abnormality on                            direct and retroflexion views. Complications:            No immediate complications. Estimated Blood Loss:     Estimated blood loss was minimal. Impression:               - Ten diminutive  polyps in the sigmoid colon, in                            the descending colon, in the transverse colon and                            in the cecum, removed with a cold snare. Resected                            and retrieved.                           - Severe diverticulosis in the sigmoid colon, in                            the descending colon, in the transverse colon and                            in the ascending colon.                           - The examination was otherwise normal on direct  and retroflexion views.                           - Personal history of colonic polyps. Adenomas over                            the yrs 2 diminutive in 2015. Recommendation:           - Patient has a contact number available for                            emergencies. The signs and symptoms of potential                            delayed complications were discussed with the                            patient. Return to normal activities tomorrow.                            Written discharge instructions were provided to the                            patient.                           - Resume previous diet.                           - Continue present medications.                           - No recommendation at this time regarding repeat                            colonoscopy due to age. Will consider given how                            vigorous he is and # polyps. Gatha Mayer, MD 01/31/2020 9:25:32 AM This report has been signed electronically.

## 2020-01-31 NOTE — Progress Notes (Signed)
To PACU, VSS. Report to [email or web address] 

## 2020-01-31 NOTE — Patient Instructions (Addendum)
I found and removed 10 tiny polyps.   I will let you know pathology results and when/if to consider having another routine colonoscopy by mail and/or My Chart.  I appreciate the opportunity to care for you. Gatha Mayer, MD, [facility]  Handouts given for polyps and diverticulosis.  YOU HAD AN ENDOSCOPIC PROCEDURE TODAY AT [facility]:   Refer to the procedure report that was given to you for any specific questions about what was found during the examination.  If the procedure report does not answer your questions, please call your gastroenterologist to clarify.  If you requested that your care partner not be given the details of your procedure findings, then the procedure report has been included in a sealed envelope for you to review at your convenience later.  YOU SHOULD EXPECT: Some feelings of bloating in the abdomen. Passage of more gas than usual.  Walking can help get rid of the air that was put into your GI tract during the procedure and reduce the bloating. If you had a lower endoscopy (such as a colonoscopy or flexible sigmoidoscopy) you may notice spotting of blood in your stool or on the toilet paper. If you underwent a bowel prep for your procedure, you may not have a normal bowel movement for a few days.  Please Note:  You might notice some irritation and congestion in your nose or some drainage.  This is from the oxygen used during your procedure.  There is no need for concern and it should clear up in a day or so.  SYMPTOMS TO REPORT IMMEDIATELY:   Following lower endoscopy (colonoscopy or flexible sigmoidoscopy):  Excessive amounts of blood in the stool  Significant tenderness or worsening of abdominal pains  Swelling of the abdomen that is new, acute  Fever of 100F or higher  For urgent or emergent issues, a gastroenterologist can be reached at any hour by calling [phone]. Do not use MyChart messaging for urgent concerns.    DIET:  We do recommend  a small meal at first, but then you may proceed to your regular diet.  Drink plenty of fluids but you should avoid alcoholic beverages for 24 hours.  ACTIVITY:  You should plan to take it easy for the rest of today and you should NOT DRIVE or use heavy machinery until tomorrow (because of the sedation medicines used during the test).    FOLLOW UP: Our staff will call the number listed on your records 48-72 hours following your procedure to check on you and address any questions or concerns that you may have regarding the information given to you following your procedure. If we do not reach you, we will leave a message.  We will attempt to reach you two times.  During this call, we will ask if you have developed any symptoms of COVID 19. If you develop any symptoms (ie: fever, flu-like symptoms, shortness of breath, cough etc.) before then, please call [phone].  If you test positive for Covid 19 in the 2 weeks post procedure, please call and report this information to [place].    If any biopsies were taken you will be contacted by phone or by letter within the next 1-3 weeks.  Please call us at [phone] if you have not heard about the biopsies in 3 weeks.    SIGNATURES/CONFIDENTIALITY: You and/or your care partner have signed paperwork which will be entered into your electronic medical record.  These signatures attest to the fact  that that the information above on your After Visit Summary has been reviewed and is understood.  Full responsibility of the confidentiality of this discharge information lies with you and/or your care-partner.

## 2020-02-02 ENCOUNTER — Telehealth: Payer: Self-pay

## 2020-02-02 NOTE — Telephone Encounter (Signed)
2nd follow up call made.  NALM 

## 2020-02-02 NOTE — Telephone Encounter (Signed)
Attempted to reach patient for post-procedure f/u call. No answer. Left message that we will make another attempt to reach him later today and for him to please not hesitate to call us if he has any questions/concerns regarding his care. 

## 2020-02-07 ENCOUNTER — Encounter: Payer: Self-pay | Admitting: Internal Medicine

## 2020-03-05 ENCOUNTER — Ambulatory Visit (INDEPENDENT_AMBULATORY_CARE_PROVIDER_SITE_OTHER): Payer: Medicare Other

## 2020-03-05 DIAGNOSIS — Z0000 Encounter for general adult medical examination without abnormal findings: Secondary | ICD-10-CM

## 2020-03-05 NOTE — Patient Instructions (Addendum)
Daniel Hampton , Thank you for taking time to come for your Medicare Wellness Visit. I appreciate your ongoing commitment to your health goals. Please review the following plan we discussed and let me know if I can assist you in the future.   Screening recommendations/referrals: Colonoscopy: 01/31/2020; due every 5 years Recommended yearly ophthalmology/optometry visit for glaucoma screening and checkup Recommended yearly dental visit for hygiene and checkup  Vaccinations: Influenza vaccine: 05/10/2019 Pneumococcal vaccine: completed Tdap vaccine: overdue; will check with local pharmacy Shingles vaccine: no documentation per [facility] Covid-19: completed  Advanced directives: Advance directive discussed with you today. I have provided a copy for you to complete at home and have notarized. Once this is complete please bring a copy in to our office so we can scan it into your chart.  Conditions/risks identified: Yes; Please continue to do your personal lifestyle choices by: daily care of teeth and gums, regular physical activity (goal should be 5 days a week for 30 minutes), eat a healthy diet, avoid tobacco and drug use, limiting any alcohol intake, taking a low-dose aspirin (if not allergic or have been advised by your provider otherwise) and taking vitamins and minerals as recommended by your provider. Continue doing brain stimulating activities (puzzles, reading, adult coloring books, staying active) to keep memory sharp. Continue to eat heart healthy diet (full of fruits, vegetables, whole grains, lean protein, water--limit salt, fat, and sugar intake) and increase physical activity as tolerated.  Next appointment: Please schedule your next Medicare Wellness Visit with your Nurse Health Advisor in 1 year.  Preventive Care 37 Years and Older, Male Preventive care refers to lifestyle choices and visits with your health care provider that can promote health and wellness. What does preventive care  include?  A yearly physical exam. This is also called an annual well check.  Dental exams once or twice a year.  Routine eye exams. Ask your health care provider how often you should have your eyes checked.  Personal lifestyle choices, including:  Daily care of your teeth and gums.  Regular physical activity.  Eating a healthy diet.  Avoiding tobacco and drug use.  Limiting alcohol use.  Practicing safe sex.  Taking low doses of aspirin every day.  Taking vitamin and mineral supplements as recommended by your health care provider. What happens during an annual well check? The services and screenings done by your health care provider during your annual well check will depend on your age, overall health, lifestyle risk factors, and family history of disease. Counseling  Your health care provider may ask you questions about your:  Alcohol use.  Tobacco use.  Drug use.  Emotional well-being.  Home and relationship well-being.  Sexual activity.  Eating habits.  History of falls.  Memory and ability to understand (cognition).  Work and work Statistician. Screening  You may have the following tests or measurements:  Height, weight, and BMI.  Blood pressure.  Lipid and cholesterol levels. These may be checked every 5 years, or more frequently if you are over 73 years old.  Skin check.  Lung cancer screening. You may have this screening every year starting at age 50 if you have a 30-pack-year history of smoking and currently smoke or have quit within the past 15 years.  Fecal occult blood test (FOBT) of the stool. You may have this test every year starting at age 22.  Flexible sigmoidoscopy or colonoscopy. You may have a sigmoidoscopy every 5 years or a colonoscopy every 10 years starting  at age 30.  Prostate cancer screening. Recommendations will vary depending on your family history and other risks.  Hepatitis C blood test.  Hepatitis B blood  test.  Sexually transmitted disease (STD) testing.  Diabetes screening. This is done by checking your blood sugar (glucose) after you have not eaten for a while (fasting). You may have this done every 1-3 years.  Abdominal aortic aneurysm (AAA) screening. You may need this if you are a current or former smoker.  Osteoporosis. You may be screened starting at age 67 if you are at high risk. Talk with your health care provider about your test results, treatment options, and if necessary, the need for more tests. Vaccines  Your health care provider may recommend certain vaccines, such as:  Influenza vaccine. This is recommended every year.  Tetanus, diphtheria, and acellular pertussis (Tdap, Td) vaccine. You may need a Td booster every 10 years.  Zoster vaccine. You may need this after age 61.  Pneumococcal 13-valent conjugate (PCV13) vaccine. One dose is recommended after age 84.  Pneumococcal polysaccharide (PPSV23) vaccine. One dose is recommended after age 39. Talk to your health care provider about which screenings and vaccines you need and how often you need them. This information is not intended to replace advice given to you by your health care provider. Make sure you discuss any questions you have with your health care provider. Document Released: 08/17/2015 Document Revised: 04/09/2016 Document Reviewed: 05/22/2015 Elsevier Interactive Patient Education  2017 [place] Prevention in the Home Falls can cause injuries. They can happen to people of all ages. There are many things you can do to make your home safe and to help prevent falls. What can I do on the outside of my home?  Regularly fix the edges of walkways and driveways and fix any cracks.  Remove anything that might make you trip as you walk through a door, such as a raised step or threshold.  Trim any bushes or trees on the path to your home.  Use bright outdoor lighting.  Clear any walking paths of  anything that might make someone trip, such as rocks or tools.  Regularly check to see if handrails are loose or broken. Make sure that both sides of any steps have handrails.  Any raised decks and porches should have guardrails on the edges.  Have any leaves, snow, or ice cleared regularly.  Use sand or salt on walking paths during winter.  Clean up any spills in your garage right away. This includes oil or grease spills. What can I do in the bathroom?  Use night lights.  Install grab bars by the toilet and in the tub and shower. Do not use towel bars as grab bars.  Use non-skid mats or decals in the tub or shower.  If you need to sit down in the shower, use a plastic, non-slip stool.  Keep the floor dry. Clean up any water that spills on the floor as soon as it happens.  Remove soap buildup in the tub or shower regularly.  Attach bath mats securely with double-sided non-slip rug tape.  Do not have throw rugs and other things on the floor that can make you trip. What can I do in the bedroom?  Use night lights.  Make sure that you have a light by your bed that is easy to reach.  Do not use any sheets or blankets that are too big for your bed. They should not hang down onto  the floor.  Have a firm chair that has side arms. You can use this for support while you get dressed.  Do not have throw rugs and other things on the floor that can make you trip. What can I do in the kitchen?  Clean up any spills right away.  Avoid walking on wet floors.  Keep items that you use a lot in easy-to-reach places.  If you need to reach something above you, use a strong step stool that has a grab bar.  Keep electrical cords out of the way.  Do not use floor polish or wax that makes floors slippery. If you must use wax, use non-skid floor wax.  Do not have throw rugs and other things on the floor that can make you trip. What can I do with my stairs?  Do not leave any items on the  stairs.  Make sure that there are handrails on both sides of the stairs and use them. Fix handrails that are broken or loose. Make sure that handrails are as long as the stairways.  Check any carpeting to make sure that it is firmly attached to the stairs. Fix any carpet that is loose or worn.  Avoid having throw rugs at the top or bottom of the stairs. If you do have throw rugs, attach them to the floor with carpet tape.  Make sure that you have a light switch at the top of the stairs and the bottom of the stairs. If you do not have them, ask someone to add them for you. What else can I do to help prevent falls?  Wear shoes that:  Do not have high heels.  Have rubber bottoms.  Are comfortable and fit you well.  Are closed at the toe. Do not wear sandals.  If you use a stepladder:  Make sure that it is fully opened. Do not climb a closed stepladder.  Make sure that both sides of the stepladder are locked into place.  Ask someone to hold it for you, if possible.  Clearly mark and make sure that you can see:  Any grab bars or handrails.  First and last steps.  Where the edge of each step is.  Use tools that help you move around (mobility aids) if they are needed. These include:  Canes.  Walkers.  Scooters.  Crutches.  Turn on the lights when you go into a dark area. Replace any light bulbs as soon as they burn out.  Set up your furniture so you have a clear path. Avoid moving your furniture around.  If any of your floors are uneven, fix them.  If there are any pets around you, be aware of where they are.  Review your medicines with your doctor. Some medicines can make you feel dizzy. This can increase your chance of falling. Ask your doctor what other things that you can do to help prevent falls. This information is not intended to replace advice given to you by your health care provider. Make sure you discuss any questions you have with your health care  provider. Document Released: 05/17/2009 Document Revised: 12/27/2015 Document Reviewed: 08/25/2014 Elsevier Interactive Patient Education  2017 [employer].

## 2020-03-05 NOTE — Progress Notes (Signed)
I connected with Zaviyar Rahal today by telephone and verified that I am speaking with the correct person using two identifiers. Location patient: home Location provider: work Persons participating in the virtual visit: Phares Breven, Guidroz, LPN   I discussed the limitations, risks, security and privacy concerns of performing an evaluation and management service by telephone and the availability of in person appointments. I also discussed with the patient that there may be a patient responsible charge related to this service. The patient expressed understanding and verbally consented to this telephonic visit.    Interactive audio and video telecommunications were attempted between this provider and patient, however failed, due to patient having technical difficulties OR patient did not have access to video capability.  We continued and completed visit with audio only.  Some vital signs may be absent or patient reported.   Time Spent with patient on telephone encounter: 20 minutes  Subjective:   Daniel Hampton is a 79 y.o. male who presents for Medicare Annual/Subsequent preventive examination.  Review of Systems    No ROS. Medicare Wellness Virtual Visit. Additional risk factors are reflected in social history. Cardiac Risk Factors include: advanced age (>49men, >70 women);dyslipidemia;family history of premature cardiovascular disease;hypertension;male gender;obesity (BMI >30kg/m2)     Objective:    There were no vitals filed for this visit. There is no height or weight on file to calculate BMI.  Advanced Directives 03/05/2020  Does Patient Have a Medical Advance Directive? No  Would patient like information on creating a medical advance directive? No - Patient declined    Current Medications (verified) Outpatient Encounter Medications as of 03/05/2020  Medication Sig  . aspirin 81 MG tablet Take 81 mg by mouth daily.    . fluticasone (FLONASE SENSIMIST) 27.5 MCG/SPRAY nasal  spray Place 2 sprays into the nose daily.  . Multiple Vitamins-Minerals (MENS MULTI VITAMIN & MINERAL PO) Take by mouth daily.    . Omega-3 Fatty Acids (FISH OIL) 1000 MG CAPS 2 caps twice daily  . simvastatin (ZOCOR) 40 MG tablet Take 1 tablet (40 mg total) by mouth daily.   No facility-administered encounter medications on file as of 03/05/2020.    Allergies (verified) Patient has no known allergies.   History: Past Medical History:  Diagnosis Date  . Allergy    SEASONAL  . Arthritis   . Asthma   . COLONIC POLYPS, HX OF 03/12/2007  . [place] DISEASE, LUMBAR 12/03/2008  . DIVERTICULOSIS, COLON 10/17/2009  . Hydrocele   . HYPERLIPIDEMIA 03/12/2007  . [place], RIGHT 03/13/2008  . SCC (squamous cell carcinoma) 04/26/2013   right hand-txpbx  . SKIN CANCER, HX OF 10/17/2009  . VERTIGO, BENIGN PAROXYSMAL POSITION 04/28/2007   Past Surgical History:  Procedure Laterality Date  . COLONOSCOPY W/ BIOPSIES    . MOHS SURGERY    . SPINE SURGERY     lumbar disk surgery   Family History  Problem Relation Age of Onset  . Lung cancer Mother   . Ovarian cancer Mother   . COPD Father   . Heart attack Father   . Alcohol abuse Brother   . Lung cancer Brother        smoker  . Breast cancer Daughter   . Breast cancer Daughter   . Colon cancer Neg Hx   . Esophageal cancer Neg Hx   . Rectal cancer Neg Hx   . Stomach cancer Neg Hx    Social History   Socioeconomic History  . Marital status:  Married    Spouse name: Not on file  . Number of children: 3  . Years of education: Not on file  . Highest education level: Not on file  Occupational History  . Occupation: Retired  Tobacco Use  . Smoking status: Former Smoker    Packs/day: 1.00    Years: 20.00    Pack years: 20.00    Types: Cigarettes    Quit date: 10/30/1978    Years since quitting: 41.3  . Smokeless tobacco: Never Used  Vaping Use  . Vaping Use: Never used  Substance and Sexual Activity  . Alcohol use: Yes  . Drug  use: No  . Sexual activity: Not on file  Other Topics Concern  . Not on file  Social History Narrative   Retired from [employer] at [employer]   Enjoys golfing   Two daughters   Married    Social Determinants of Health   Financial Resource Strain: [place]   . Difficulty of Paying Living Expenses: Not hard at all  Food Insecurity: No Food Insecurity  . Worried About Charity fundraiser in the Last Year: Never true  . Ran Out of Food in the Last Year: Never true  Transportation Needs: No Transportation Needs  . Lack of Transportation (Medical): No  . Lack of Transportation (Non-Medical): No  Physical Activity: Sufficiently Active  . Days of Exercise per Week: 5 days  . Minutes of Exercise per Session: 60 min  Stress: No Stress Concern Present  . Feeling of Stress : Not at all  Social Connections: Unknown  . Frequency of Communication with Friends and Family: More than three times a week  . Frequency of Social Gatherings with Friends and Family: Once a week  . Attends Religious Services: Patient refused  . Active Member of Clubs or Organizations: Yes  . Attends Archivist Meetings: Patient refused  . Marital Status: Married    Tobacco Counseling Counseling given: Not Answered   Clinical Intake:  Pre-visit preparation completed: Yes  Pain : No/denies pain     Nutritional Risks: None Diabetes: No  How often do you need to have someone help you when you read instructions, pamphlets, or other written materials from your doctor or pharmacy?: 1 - Never What is the last grade level you completed in school?: HSG; some college  Diabetic? no  Interpreter Needed?: No  Information entered by :: Lisette Abu, LPN   Activities of Daily Living In your present state of health, do you have any difficulty performing the following activities: 03/05/2020  Hearing? N  Vision? N  Difficulty concentrating or making decisions? N  Walking or climbing stairs?  N  Dressing or bathing? N  Doing errands, shopping? N  Preparing Food and eating ? N  Using the Toilet? N  In the past six months, have you accidently leaked urine? N  Do you have problems with loss of bowel control? N  Managing your Medications? N  Managing your Finances? N  Housekeeping or managing your Housekeeping? N  Some recent data might be hidden    Patient Care Team: [name], MD as PCP - General (Internal Medicine) Gerda Diss, DO as Consulting Physician (Sports Medicine) Festus Aloe, MD as Consulting Physician (Urology) Harold Hedge, Darrick Grinder, MD as Consulting Physician (Allergy and Immunology)  Indicate any recent Medical Services you may have received from other than [facility] providers in the past year (date may be approximate).  Assessment:   This is a routine wellness examination for Daniel Hampton.  Hearing/Vision screen No exam data present  Dietary issues and exercise activities discussed: Current Exercise Habits: Home exercise routine, Type of exercise: strength training/weights;stretching;treadmill;walking;Other - see comments (golf two times a week, exercise room at home, walk 6,000 steps everyday), Time (Minutes): 60, Frequency (Times/Week): 5, Weekly Exercise (Minutes/Week): 300, Intensity: Moderate, Exercise limited by: None identified  Goals    .  Client understands the importance of follow-up with providers by attending scheduled visits (pt-stated)      My goal is to continue to stay active by walking 3 miles (6,000 steps) every other day, playing golf ang get my cholesterol down.      Depression Screen PHQ 2/9 Scores 03/05/2020 04/27/2019 10/22/2017 09/10/2015 08/08/2014 06/21/2013  PHQ - 2 Score 0 0 0 0 0 0    Fall Risk Fall Risk  03/05/2020 04/27/2019 10/22/2017 09/10/2015 08/08/2014  Falls in the past year? 0 0 No No No  Number falls in past yr: 0 0 - - -  Injury with Fall? 0 0 - - -  Risk for fall due to : No Fall Risks - - - -  Follow up Falls  evaluation completed Falls evaluation completed - - -    Any stairs in or around the home? Yes  If so, are there any without handrails? No  Home free of loose throw rugs in walkways, pet beds, electrical cords, etc? Yes  Adequate lighting in your home to reduce risk of falls? Yes   ASSISTIVE DEVICES UTILIZED TO PREVENT FALLS:  Life alert? No  Use of a cane, walker or w/c? No  Grab bars in the bathroom? No  Shower chair or bench in shower? No  Elevated toilet seat or a handicapped toilet? No   TIMED UP AND GO:  Was the test performed? No .  Length of time to ambulate 10 feet: 0 sec.   Gait steady and fast without use of assistive device  Cognitive Function: Not indicated; Patient is cogitatively intact.        Immunizations Immunization History  Administered Date(s) Administered  . Fluad Quad(high Dose 65+) 05/10/2019  . Influenza Split 04/14/2012, 05/31/2013  . Influenza Whole 08/04/2005, 07/19/2007  . Influenza, High Dose Seasonal PF 06/05/2015, 07/01/2016, 05/12/2018  . Influenza,inj,Quad PF,6+ Mos 06/06/2017  . Influenza-Unspecified 05/04/2014, 06/05/2015  . PFIZER SARS-COV-2 Vaccination 08/26/2019, 09/13/2019  . Pneumococcal Conjugate-13 08/08/2014  . Pneumococcal Polysaccharide-23 08/04/2004, 12/31/2010  . Td 08/04/2006  . Zoster 04/04/2012    TDAP status: Due, Education has been provided regarding the importance of this vaccine. Advised may receive this vaccine at local pharmacy or Health Dept. Aware to provide a copy of the vaccination record if obtained from local pharmacy or Health Dept. Verbalized acceptance and understanding. Flu Vaccine status: Up to date Pneumococcal vaccine status: Up to date Covid-19 vaccine status: Completed vaccines  Qualifies for Shingles Vaccine? Yes   Zostavax completed Yes   Shingrix Completed?: No.    Education has been provided regarding the importance of this vaccine. Patient has been advised to call insurance company to  determine out of pocket expense if they have not yet received this vaccine. Advised may also receive vaccine at local pharmacy or Health Dept. Verbalized acceptance and understanding.  Screening Tests Health Maintenance  Topic Date Due  . INFLUENZA VACCINE  03/04/2020  . TETANUS/TDAP  01/17/2021 (Originally 08/04/2016)  . COLONOSCOPY  01/30/2025  . COVID-19 Vaccine  Completed  . PNA vac  Low Risk Adult  Completed    Health Maintenance  Health Maintenance Due  Topic Date Due  . INFLUENZA VACCINE  03/04/2020    Colorectal cancer screening: Completed 01/31/2020. Repeat every 5 years  Lung Cancer Screening: (Low Dose CT Chest recommended if Age 25-80 years, 30 pack-year currently smoking OR have quit w/in 15years.) does not qualify.   Lung Cancer Screening Referral: no  Additional Screening:  Hepatitis C Screening: does not qualify; Completed no  Vision Screening: Recommended annual ophthalmology exams for early detection of glaucoma and other disorders of the eye. Is the patient up to date with their annual eye exam?  Yes  Who is the provider or what is the name of the office in which the patient attends annual eye exams? Jola Schmidt, MD If pt is not established with a provider, would they like to be referred to a provider to establish care? No .   Dental Screening: Recommended annual dental exams for proper oral hygiene  Community Resource Referral / Chronic Care Management: CRR required this visit?  No   CCM required this visit?  No      Plan:     I have personally reviewed and noted the following in the patient's chart:   . Medical and social history . Use of alcohol, tobacco or illicit drugs  . Current medications and supplements . Functional ability and status . Nutritional status . Physical activity . Advanced directives . List of other physicians . Hospitalizations, surgeries, and ER visits in previous 12 months . Vitals . Screenings to include cognitive,  depression, and falls . Referrals and appointments  In addition, I have reviewed and discussed with patient certain preventive protocols, quality metrics, and best practice recommendations. A written personalized care plan for preventive services as well as general preventive health recommendations were provided to patient.     Sheral Flow, LPN   10/09/5434   Nurse Notes:  Patient is cogitatively intact. There were no vitals filed for this visit. There is no height or weight on file to calculate BMI. Patient stated that he has no issues with gait or balance; does not use any assistive devices.

## 2020-03-21 DIAGNOSIS — N281 Cyst of kidney, acquired: Secondary | ICD-10-CM | POA: Diagnosis not present

## 2020-03-21 DIAGNOSIS — R350 Frequency of micturition: Secondary | ICD-10-CM | POA: Diagnosis not present

## 2020-04-17 DIAGNOSIS — H1045 Other chronic allergic conjunctivitis: Secondary | ICD-10-CM | POA: Diagnosis not present

## 2020-04-17 DIAGNOSIS — J3089 Other allergic rhinitis: Secondary | ICD-10-CM | POA: Diagnosis not present

## 2020-04-17 DIAGNOSIS — J301 Allergic rhinitis due to pollen: Secondary | ICD-10-CM | POA: Diagnosis not present

## 2020-04-17 DIAGNOSIS — R05 Cough: Secondary | ICD-10-CM | POA: Diagnosis not present

## 2020-04-23 ENCOUNTER — Other Ambulatory Visit: Payer: Self-pay

## 2020-04-23 ENCOUNTER — Encounter: Payer: Self-pay | Admitting: Dermatology

## 2020-04-23 ENCOUNTER — Ambulatory Visit: Payer: Medicare Other | Admitting: Dermatology

## 2020-04-23 DIAGNOSIS — Z1283 Encounter for screening for malignant neoplasm of skin: Secondary | ICD-10-CM

## 2020-04-23 DIAGNOSIS — L821 Other seborrheic keratosis: Secondary | ICD-10-CM | POA: Diagnosis not present

## 2020-04-23 DIAGNOSIS — C439 Malignant melanoma of skin, unspecified: Secondary | ICD-10-CM

## 2020-04-23 DIAGNOSIS — L738 Other specified follicular disorders: Secondary | ICD-10-CM

## 2020-04-23 DIAGNOSIS — D0362 Melanoma in situ of left upper limb, including shoulder: Secondary | ICD-10-CM | POA: Diagnosis not present

## 2020-04-23 DIAGNOSIS — D485 Neoplasm of uncertain behavior of skin: Secondary | ICD-10-CM

## 2020-04-23 HISTORY — DX: Malignant melanoma of skin, unspecified: C43.9

## 2020-04-23 NOTE — Patient Instructions (Addendum)
Routine follow-up for Daniel Hampton date of birth [birth year].  Legs and all skin waist up examined.  He has 5 dozen rough brown keratoses on his back which require no freezing or removal.  On the left back shoulder is a purplish benign angioma which likewise is safe to leave.  On the forehead are some little bumps called sebaceous hyperplasia which may mimic an early basal cell but which are not prone to become skin cancer.  There is a smooth irregular pigmented 1 cm spot on the left lower deltoid; with dermoscopy this appears to be related to moles rather than keratosis so shave biopsy done.  He could check on MyChart or call my office on Thursday for this result.  Biopsy, Surgery (Curettage) & Surgery (Excision) Aftercare Instructions  1. Okay to remove bandage in 24 hours  2. Wash area with soap and water  3. Apply Vaseline to area twice daily until healed (Not Neosporin)  4. Okay to cover with a Band-Aid to decrease the chance of infection or prevent irritation from clothing; also it's okay to uncover lesion at home.  5. Suture instructions: return to our office in 7-10 or 10-14 days for a nurse visit for suture removal. Variable healing with sutures, if pain or itching occurs call our office. It's okay to shower or bathe 24 hours after sutures are given.  6. The following risks may occur after a biopsy, curettage or excision: bleeding, scarring, discoloration, recurrence, infection (redness, yellow drainage, pain or swelling).  7. For questions, concerns and results call our office at [place] before 4pm & Friday before 3pm. Biopsy results will be available in 1 week.

## 2020-04-30 ENCOUNTER — Telehealth: Payer: Self-pay | Admitting: *Deleted

## 2020-04-30 NOTE — Telephone Encounter (Signed)
-----   Message from Lavonna Monarch, MD sent at 04/27/2020  6:54 AM EDT ----- You can inform Mr. 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  In either event he can be scheduled for a 1 hour surgery.  I would be willing to do this on a Friday when I have no other obligations.

## 2020-05-02 ENCOUNTER — Telehealth: Payer: Self-pay | Admitting: *Deleted

## 2020-05-02 ENCOUNTER — Encounter: Payer: Self-pay | Admitting: *Deleted

## 2020-05-02 NOTE — Telephone Encounter (Signed)
-----   Message from Lavonna Monarch, MD sent at 04/27/2020  6:54 AM EDT ----- You can inform Mr. 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  In either event he can be scheduled for a 1 hour surgery.  I would be willing to do this on a Friday when I have no other obligations.

## 2020-05-02 NOTE — Telephone Encounter (Signed)
Pathology given to patient per Dr Denna Haggard, 1 hour surgery scheduled.

## 2020-05-03 NOTE — Telephone Encounter (Signed)
Discussed diagnosis of MIS with pt on 05/02/20. All questions answered and surgery scheduled.

## 2020-05-23 ENCOUNTER — Encounter: Payer: Self-pay | Admitting: Internal Medicine

## 2020-05-24 NOTE — Progress Notes (Signed)
   Follow-Up Visit   Subjective  Daniel Hampton is a 79 y.o. male who presents for the following: Annual Exam (skin check).  Annual skin exam Location:  Duration:  Quality:  Associated Signs/Symptoms: Modifying Factors:  Severity:  Timing: Context:   Objective  Well appearing patient in no apparent distress; mood and affect are within normal limits.  A full examination was performed including scalp, head, eyes, ears, nose, lips, neck, chest, axillae, abdomen, back, buttocks, bilateral upper extremities, bilateral lower extremities, hands, feet, fingers, toes, fingernails, and toenails. All findings within normal limits unless otherwise noted below.   Assessment & Plan    Routine follow-up for Daniel Hampton date of birth Feb 11, 1941.  Legs and all skin waist up examined.  He has 5 dozen rough brown keratoses on his back which require no freezing or removal.  On the left back shoulder is a purpleish benign angioma which likewise is safe to leave.  On the forehead are some little bumps called sebaceous hyperplasia which may mimic an early basal cell but which are not prone to become skin cancer.  There is a smooth irregular pigmented 1 cm spot on the left lower deltoid; with dermoscopy this appears to be related to moles rather than keratosis so shave biopsy done.  He could check on MyChart or call my office on Thursday for this result.   Neoplasm of uncertain behavior of skin Left Upper Arm - Anterior  Skin / nail biopsy Type of biopsy: tangential   Informed consent: discussed and consent obtained   Timeout: patient name, date of birth, surgical site, and procedure verified   Procedure prep:  Patient was prepped and draped in usual sterile fashion Prep type:  Chlorhexidine Anesthesia: the lesion was anesthetized in a standard fashion   Anesthetic:  1% lidocaine w/ epinephrine 1-100,000 local infiltration Instrument used: flexible razor blade   Hemostasis achieved with: ferric  subsulfate   Outcome: patient tolerated procedure well   Post-procedure details: wound care instructions given    Specimen 1 - Surgical pathology Differential Diagnosis: MM Check Margins: No  Seborrheic keratosis Mid Back  Leave if stable  Skin exam for malignant neoplasm Mid Back  Skin cancer screening performed today.    I, Lavonna Monarch, MD, have reviewed all documentation for this visit.  The documentation on 05/26/20 for the exam, diagnosis, procedures, and orders are all accurate and complete.

## 2020-05-26 ENCOUNTER — Encounter: Payer: Self-pay | Admitting: Dermatology

## 2020-06-21 ENCOUNTER — Other Ambulatory Visit: Payer: Self-pay

## 2020-06-21 ENCOUNTER — Ambulatory Visit (INDEPENDENT_AMBULATORY_CARE_PROVIDER_SITE_OTHER): Payer: Medicare Other | Admitting: Dermatology

## 2020-06-21 ENCOUNTER — Encounter: Payer: Self-pay | Admitting: Dermatology

## 2020-06-21 DIAGNOSIS — C4362 Malignant melanoma of left upper limb, including shoulder: Secondary | ICD-10-CM | POA: Diagnosis not present

## 2020-06-21 DIAGNOSIS — D0362 Melanoma in situ of left upper limb, including shoulder: Secondary | ICD-10-CM | POA: Diagnosis not present

## 2020-06-21 DIAGNOSIS — C439 Malignant melanoma of skin, unspecified: Secondary | ICD-10-CM

## 2020-06-21 NOTE — Patient Instructions (Signed)

## 2020-07-03 ENCOUNTER — Other Ambulatory Visit: Payer: Self-pay

## 2020-07-03 ENCOUNTER — Ambulatory Visit (INDEPENDENT_AMBULATORY_CARE_PROVIDER_SITE_OTHER): Payer: Medicare Other | Admitting: Dermatology

## 2020-07-03 DIAGNOSIS — Z4802 Encounter for removal of sutures: Secondary | ICD-10-CM

## 2020-07-03 MED ORDER — SULFAMETHOXAZOLE-TRIMETHOPRIM 800-160 MG PO TABS: 1.0000 | ORAL_TABLET | Freq: Two times a day (BID) | ORAL | 0 refills | Status: AC

## 2020-07-03 NOTE — Progress Notes (Signed)
Patient here to have sutures removed- excision site red & inflamed- Dr. Denna Haggard came to check site and remove sutures. Sutures removed per [place], path to patient, bacterial culture done and antibiotic sent to patient pharmacy.

## 2020-07-05 NOTE — Progress Notes (Signed)
° °  Follow-Up Visit   Subjective  Daniel Hampton is a 79 y.o. male who presents for the following: Suture / Staple Removal (Patient here today for suture removal.).  Wound check Location:  Duration:  Quality:  Associated Signs/Symptoms: Modifying Factors:  Severity:  Timing: Context:   Objective  Well appearing patient in no apparent distress; mood and affect are within normal limits.  A focused examination was performed including Left upper arm. Relevant physical exam findings are noted in the Assessment and Plan.   Assessment & Plan    Encounter for removal of sutures Left Upper Arm - Posterior  Bactrim bid x  10 days.  To call the office with a status update at that time.  Call me sooner if there is spreading redness or fever with general malaise.  Anaerobic and Aerobic Culture - Left Upper Arm - Posterior      I, Lavonna Monarch, MD, have reviewed all documentation for this visit.  The documentation on 07/05/20 for the exam, diagnosis, procedures, and orders are all accurate and complete.

## 2020-07-09 ENCOUNTER — Telehealth: Payer: Self-pay | Admitting: *Deleted

## 2020-07-09 LAB — ANAEROBIC AND AEROBIC CULTURE
MICRO NUMBER:: 11257361
MICRO NUMBER:: 11257362
SPECIMEN QUALITY:: ADEQUATE
SPECIMEN QUALITY:: ADEQUATE

## 2020-07-09 NOTE — Telephone Encounter (Signed)
Cultures results to patient, per Dr. Denna Haggard keep taking antibiotic as directed.  I called patient to check satus. Patient states all is well, told patient to call if needed.

## 2020-07-18 NOTE — Patient Instructions (Addendum)
  Blood work was ordered.       Medications changes include :   None     Please followup in 6 months   

## 2020-07-18 NOTE — Progress Notes (Signed)
Subjective:    Patient ID: Daniel Hampton, male    DOB: 10-11-1940, 79 y.o.   MRN: [id number]  HPI The patient is here for follow up of their chronic medical problems, including hyperlipidemia, aortic atherosclerosis  He has muscle aches in his legs and wonders if it is from the statin.  He did not take it a while ago for 2-3 months and his symptoms did not improve.  He wondered about zetia.   He walks 3 miles a day. He plays golf.       Medications and allergies reviewed with patient and updated if appropriate.  Patient Active Problem List   Diagnosis Date Noted   Allergic rhinitis 01/18/2020   Aortic atherosclerosis ([place]) 01/18/2020   Biceps tendinitis of left shoulder 02/22/2019   Medial epicondylitis of elbow, right 05/17/2018   Lateral epicondylitis of right elbow 05/17/2018   DIVERTICULOSIS, COLON 10/17/2009   H/O melanoma in situ 10/17/2009   [place] DISEASE, LUMBAR 12/03/2008   [place]'s neuroma of both feet 03/13/2008   VERTIGO, BENIGN PAROXYSMAL POSITION 04/28/2007   Hyperlipidemia 03/12/2007   COLONIC POLYPS, HX OF 03/12/2007    Current Outpatient Medications on File Prior to Visit  Medication Sig Dispense Refill   aspirin 81 MG tablet Take 81 mg by mouth daily.     fluticasone (FLONASE SENSIMIST) 27.5 MCG/SPRAY nasal spray Place 2 sprays into the nose daily.     Multiple Vitamins-Minerals (MENS MULTI VITAMIN & MINERAL PO) Take by mouth daily.     Omega-3 Fatty Acids (FISH OIL) 1000 MG CAPS 2 caps twice daily  0   simvastatin (ZOCOR) 40 MG tablet Take 1 tablet (40 mg total) by mouth daily. 90 tablet 1   tamsulosin (FLOMAX) 0.4 MG CAPS capsule Take 0.4 mg by mouth daily.     No current facility-administered medications on file prior to visit.    Past Medical History:  Diagnosis Date   Allergy    SEASONAL   Arthritis    Asthma    COLONIC POLYPS, HX OF 03/12/2007   DISC DISEASE, LUMBAR 12/03/2008   DIVERTICULOSIS, COLON 10/17/2009    Hydrocele    HYPERLIPIDEMIA 03/12/2007   Melanoma ([place]) 04/23/2020   in situ-Left Upper Arm   MORTON'S NEUROMA, RIGHT 03/13/2008   SCC (squamous cell carcinoma) 04/26/2013   right hand-txpbx   SKIN CANCER, HX OF 10/17/2009   VERTIGO, BENIGN PAROXYSMAL POSITION 04/28/2007    Past Surgical History:  Procedure Laterality Date   COLONOSCOPY W/ BIOPSIES     MOHS SURGERY     SPINE SURGERY     lumbar disk surgery    Social History   Socioeconomic History   Marital status: Married    Spouse name: Not on file   Number of children: 3   Years of education: Not on file   Highest education level: Not on file  Occupational History   Occupation: Retired  Tobacco Use   Smoking status: Former Smoker    Packs/day: 1.00    Years: 20.00    Pack years: 20.00    Types: Cigarettes    Quit date: 10/30/1978    Years since quitting: 41.7   Smokeless tobacco: Never Used  Vaping Use   Vaping Use: Never used  Substance and Sexual Activity   Alcohol use: Yes   Drug use: No   Sexual activity: Not on file  Other Topics Concern   Not on file  Social History Narrative   Retired from [employer]  Lives at [facility]   Enjoys golfing   Two daughters   Married    Social Determinants of Health   Financial Resource Strain: Low Risk    Difficulty of Paying Living Expenses: Not hard at all  Food Insecurity: No Food Insecurity   Worried About Charity fundraiser in the Last Year: Never true   Arboriculturist in the Last Year: Never true  Transportation Needs: No Transportation Needs   Lack of Transportation (Medical): No   Lack of Transportation (Non-Medical): No  Physical Activity: Sufficiently Active   Days of Exercise per Week: 5 days   Minutes of Exercise per Session: 60 min  Stress: No Stress Concern Present   Feeling of Stress : Not at all  Social Connections: Unknown   Frequency of Communication with Friends and Family: More than three times a week    Frequency of Social Gatherings with Friends and Family: Once a week   Attends Religious Services: Patient refused   Marine scientist or Organizations: Yes   Attends Music therapist: Patient refused   Marital Status: Married    Family History  Problem Relation Age of Onset   Lung cancer Mother    Ovarian cancer Mother    COPD Father    Heart attack Father    Alcohol abuse Brother    Lung cancer Brother        smoker   Breast cancer Daughter    Breast cancer Daughter    Colon cancer Neg Hx    Esophageal cancer Neg Hx    Rectal cancer Neg Hx    Stomach cancer Neg Hx     Review of Systems  Constitutional: Negative for fever.  Respiratory: Positive for cough. Negative for shortness of breath and wheezing.   Cardiovascular: Negative for chest pain, palpitations and leg swelling.  Neurological: Positive for dizziness (occ). Negative for headaches.       Objective:   Vitals:   07/19/20 1016  BP: 136/72  Pulse: 65  Temp: 98 F (36.7 C)  SpO2: 97%   BP Readings from Last 3 Encounters:  07/19/20 136/72  01/31/20 (!) 112/58  01/18/20 130/70   Wt Readings from Last 3 Encounters:  07/19/20 240 lb (108.9 kg)  01/31/20 235 lb (106.6 kg)  01/18/20 235 lb (106.6 kg)   Body mass index is 34.44 kg/m.   Physical Exam    Constitutional: Appears well-developed and well-nourished. No distress.  HENT:  Head: Normocephalic and atraumatic.  Neck: Neck supple. No tracheal deviation present. No thyromegaly present.  No cervical lymphadenopathy Cardiovascular: Normal rate, regular rhythm and normal heart sounds.  No murmur heard. No carotid bruit .  Trace b/l LE edema Pulmonary/Chest: Effort normal and breath sounds normal. No respiratory distress. No has no wheezes. No rales.  Skin: Skin is warm and dry. Not diaphoretic.  Psychiatric: Normal mood and affect. Behavior is normal.      Assessment & Plan:    See Problem List for Assessment  and Plan of chronic medical problems.    This visit occurred during the SARS-CoV-2 public health emergency.  Safety protocols were in place, including screening questions prior to the visit, additional usage of staff PPE, and extensive cleaning of exam room while observing appropriate contact time as indicated for disinfecting solutions.

## 2020-07-19 ENCOUNTER — Other Ambulatory Visit: Payer: Self-pay

## 2020-07-19 ENCOUNTER — Encounter: Payer: Self-pay | Admitting: Internal Medicine

## 2020-07-19 ENCOUNTER — Ambulatory Visit (INDEPENDENT_AMBULATORY_CARE_PROVIDER_SITE_OTHER): Payer: Medicare Other | Admitting: Internal Medicine

## 2020-07-19 VITALS — BP 136/72 | HR 65 | Temp 98.0°F | Ht 70.0 in | Wt 240.0 lb

## 2020-07-19 DIAGNOSIS — M791 Myalgia, unspecified site: Secondary | ICD-10-CM | POA: Diagnosis not present

## 2020-07-19 DIAGNOSIS — E782 Mixed hyperlipidemia: Secondary | ICD-10-CM | POA: Diagnosis not present

## 2020-07-19 DIAGNOSIS — I700 Atherosclerosis of aorta: Secondary | ICD-10-CM | POA: Diagnosis not present

## 2020-07-19 DIAGNOSIS — R748 Abnormal levels of other serum enzymes: Secondary | ICD-10-CM

## 2020-07-19 LAB — LIPID PANEL
Cholesterol: 177 mg/dL (ref 0–200)
HDL: 49.4 mg/dL (ref >39.00)
LDL Cholesterol: 89 mg/dL (ref 0–99)
NonHDL: 127.15
Total CHOL/HDL Ratio: 4
Triglycerides: 192 mg/dL — ABNORMAL HIGH (ref 0.0–149.0)
VLDL: 38.4 mg/dL (ref 0.0–40.0)

## 2020-07-19 LAB — HEPATIC FUNCTION PANEL
ALT: 42 U/L (ref 0–53)
AST: 38 U/L — ABNORMAL HIGH (ref 0–37)
Albumin: 4.5 g/dL (ref 3.5–5.2)
Alkaline Phosphatase: 44 U/L (ref 39–117)
Bilirubin, Direct: 0.1 mg/dL (ref 0.0–0.3)
Total Bilirubin: 0.6 mg/dL (ref 0.2–1.2)
Total Protein: 7.4 g/dL (ref 6.0–8.3)

## 2020-07-19 LAB — CK: Total CK: 457 U/L — ABNORMAL HIGH (ref 7–232)

## 2020-07-19 MED ORDER — CO-ENZYME Q-10 50 MG PO CAPS: 50.0000 mg | ORAL_CAPSULE | Freq: Every day | ORAL | Status: DC

## 2020-07-19 MED ORDER — TURMERIC 500 MG PO CAPS: ORAL_CAPSULE | ORAL | Status: DC

## 2020-07-19 NOTE — Assessment & Plan Note (Signed)
Chronic Check lipid panel, cmp, ck Has some muscle aches - he wonders if it is the statin  - he did not take it for 2-3 months and there was no improvement so unlikely the statin - will check ck Continue simvastatin 40 mg daily Continue regular exercise and healthy diet

## 2020-07-19 NOTE — Addendum Note (Signed)
Addended by: [name] on: 07/19/2020 09:54 PM   Modules accepted: Orders

## 2020-07-19 NOTE — Assessment & Plan Note (Signed)
Chronic On simvastatin 40 mg daily - continue Lipids, cmp today Continue regular exercise Heart healthy diet

## 2020-07-20 ENCOUNTER — Other Ambulatory Visit: Payer: Self-pay | Admitting: Internal Medicine

## 2020-07-23 ENCOUNTER — Encounter: Payer: Self-pay | Admitting: Dermatology

## 2020-07-23 NOTE — Progress Notes (Signed)
   Follow-Up Visit   Subjective  Daniel Hampton is a 79 y.o. male who presents for the following: Procedure (here for treatement- left upper arm- MM in situ).  Melanoma in situ Location: Left upper Duration:  Quality:  Associated Signs/Symptoms: Modifying Factors:  Severity:  Timing: Context: For wide excision  Objective  Well appearing patient in no apparent distress; mood and affect are within normal limits. Objective  Left Upper Arm: Biopsy site found by patient, nurse, and me.   All skin waist up examined.  No adenopathy.   Assessment & Plan    Malignant melanoma of skin (HCC) Left Upper Arm  Skin excision  Lesion length (cm):  1 Lesion width (cm):  1 Margin per side (cm):  1 Total excision diameter (cm):  3 Informed consent: discussed and consent obtained   Timeout: patient name, date of birth, surgical site, and procedure verified   Anesthesia: the lesion was anesthetized in a standard fashion   Anesthetic:  1% lidocaine w/ epinephrine 1-100,000 local infiltration Instrument used: #15 blade   Hemostasis achieved with: pressure and electrodesiccation   Outcome: patient tolerated procedure well with no complications   Post-procedure details: sterile dressing applied and wound care instructions given   Dressing type: bandage, petrolatum and pressure dressing    Skin repair Complexity:  Intermediate Final length (cm):  8.4 Informed consent: discussed and consent obtained   Procedure prep:  Patient was prepped and draped in usual sterile fashion Prep type:  Chlorhexidine Reason for type of repair: reduce tension to allow closure, reduce the risk of dehiscence, infection, and necrosis and reduce subcutaneous dead space and avoid a hematoma   Undermining: edges could be approximated without difficulty   Subcutaneous layers (deep stitches):  Suture size:  3-0 Suture type: Vicryl (polyglactin 910)   Fine/surface layer approximation (top stitches):  Suture size:   3-0 Suture type: nylon   Stitches: horizontal mattress and simple interrupted   Suture removal (days):  14 Hemostasis achieved with: suture and pressure Outcome: patient tolerated procedure well with no complications   Post-procedure details: sterile dressing applied   Dressing type: petrolatum    Specimen 1 - Surgical pathology Differential Diagnosis: melanoma Check Margins:   medial margin stained [id number] 3-0 vicryl x 6 3-0 ethilon x 8 + 2 vicryl on outside.   Lesion marked with 1 cm margins..  To deep subcu, undermined, and layered closure.  Suture removal 2 weeks.  Follow up in 3 months     I, Lavonna Monarch, MD, have reviewed all documentation for this visit.  The documentation on 07/23/20 for the exam, diagnosis, procedures, and orders are all accurate and complete.

## 2020-09-20 ENCOUNTER — Other Ambulatory Visit (INDEPENDENT_AMBULATORY_CARE_PROVIDER_SITE_OTHER): Payer: Medicare Other

## 2020-09-20 ENCOUNTER — Other Ambulatory Visit: Payer: Self-pay

## 2020-09-20 DIAGNOSIS — M791 Myalgia, unspecified site: Secondary | ICD-10-CM

## 2020-09-20 DIAGNOSIS — E782 Mixed hyperlipidemia: Secondary | ICD-10-CM | POA: Diagnosis not present

## 2020-09-20 DIAGNOSIS — R748 Abnormal levels of other serum enzymes: Secondary | ICD-10-CM | POA: Diagnosis not present

## 2020-09-20 DIAGNOSIS — I700 Atherosclerosis of aorta: Secondary | ICD-10-CM

## 2020-09-20 LAB — LIPID PANEL
Cholesterol: 271 mg/dL — ABNORMAL HIGH (ref 0–200)
HDL: 43.8 mg/dL (ref >39.00)
LDL Cholesterol: 191 mg/dL — ABNORMAL HIGH (ref 0–99)
NonHDL: 227.4
Total CHOL/HDL Ratio: 6
Triglycerides: 182 mg/dL — ABNORMAL HIGH (ref 0.0–149.0)
VLDL: 36.4 mg/dL (ref 0.0–40.0)

## 2020-09-20 LAB — COMPREHENSIVE METABOLIC PANEL
ALT: 51 U/L (ref 0–53)
AST: 37 U/L (ref 0–37)
Albumin: 4.4 g/dL (ref 3.5–5.2)
Alkaline Phosphatase: 42 U/L (ref 39–117)
BUN: 17 mg/dL (ref 6–23)
CO2: 31 mEq/L (ref 19–32)
Calcium: 9.3 mg/dL (ref 8.4–10.5)
Chloride: 105 mEq/L (ref 96–112)
Creatinine, Ser: 1.04 mg/dL (ref 0.40–1.50)
GFR: 67.72 mL/min (ref >60.00)
Glucose, Bld: 91 mg/dL (ref 70–99)
Potassium: 4.4 mEq/L (ref 3.5–5.1)
Sodium: 141 mEq/L (ref 135–145)
Total Bilirubin: 0.8 mg/dL (ref 0.2–1.2)
Total Protein: 7 g/dL (ref 6.0–8.3)

## 2020-09-20 LAB — CK: Total CK: 402 U/L — ABNORMAL HIGH (ref 7–232)

## 2020-09-20 LAB — SEDIMENTATION RATE: Sed Rate: 4 mm/hr (ref 0–20)

## 2020-09-23 ENCOUNTER — Encounter: Payer: Self-pay | Admitting: Internal Medicine

## 2020-09-24 ENCOUNTER — Encounter: Payer: Self-pay | Admitting: Internal Medicine

## 2020-09-24 ENCOUNTER — Encounter: Payer: Self-pay | Admitting: Neurology

## 2020-09-24 DIAGNOSIS — R748 Abnormal levels of other serum enzymes: Secondary | ICD-10-CM

## 2020-09-24 DIAGNOSIS — M791 Myalgia, unspecified site: Secondary | ICD-10-CM

## 2020-10-03 ENCOUNTER — Other Ambulatory Visit: Payer: Self-pay

## 2020-10-03 ENCOUNTER — Encounter: Payer: Self-pay | Admitting: Internal Medicine

## 2020-10-03 ENCOUNTER — Other Ambulatory Visit (INDEPENDENT_AMBULATORY_CARE_PROVIDER_SITE_OTHER): Payer: Medicare Other

## 2020-10-03 DIAGNOSIS — M791 Myalgia, unspecified site: Secondary | ICD-10-CM

## 2020-10-03 DIAGNOSIS — R748 Abnormal levels of other serum enzymes: Secondary | ICD-10-CM

## 2020-10-03 LAB — SEDIMENTATION RATE: Sed Rate: 10 mm/hr (ref 0–20)

## 2020-10-03 LAB — CK: Total CK: 672 U/L — ABNORMAL HIGH (ref 7–232)

## 2020-10-29 ENCOUNTER — Other Ambulatory Visit: Payer: Self-pay

## 2020-10-29 ENCOUNTER — Encounter: Payer: Self-pay | Admitting: Neurology

## 2020-10-29 ENCOUNTER — Ambulatory Visit: Payer: Medicare Other | Admitting: Neurology

## 2020-10-29 ENCOUNTER — Other Ambulatory Visit: Payer: Medicare Other

## 2020-10-29 VITALS — BP 125/72 | HR 70 | Ht 70.0 in | Wt 243.0 lb

## 2020-10-29 DIAGNOSIS — R748 Abnormal levels of other serum enzymes: Secondary | ICD-10-CM | POA: Diagnosis not present

## 2020-10-29 DIAGNOSIS — M5417 Radiculopathy, lumbosacral region: Secondary | ICD-10-CM

## 2020-10-29 DIAGNOSIS — Z9889 Other specified postprocedural states: Secondary | ICD-10-CM

## 2020-10-29 NOTE — Patient Instructions (Addendum)
MRI lumbar spine   Nerve testing of the legs  Check labs   We will contact you with the results and let you know the next step.

## 2020-10-29 NOTE — Progress Notes (Addendum)
[facility] Clinic Note - Initial Visit   Date: 10/29/20  Daniel Hampton MRN: [id number] DOB: [birth year]   Dear Dr. Quay Burow:  Thank you for your kind referral of Daniel Hampton for consultation of bilateral leg numbness and hyperckemia. Although his history is well known to you, please allow [place] to reiterate it for the purpose of our medical record. The patient was accompanied to the clinic by self.    History of Present Illness: Daniel Hampton is a 80 y.o. right-handed male with history of lumbar surgery (30+ years ago)2 presenting for evaluation of bilateral leg numbness and hyperCKemia.   He has lumbar surgery 30+ years ago and for the past 20 years, he has noticed episodic numbness over the lateral aspect of the leg and foot bilaterally.   There are not specific triggers. He denies weakness and is able to walk 3 miles without difficulty.  He has chronic low back pain.   In the late 2021, he was having some leg pain and CK was checked and found to be elevated 457.  He stopped statin for 58-months and there was no change in symptoms.  He denies muscle pain or weakness. He has cramps in the calf occasionally.    Out-side paper records, electronic medical record, and images have been reviewed where available and summarized as:  Component     Latest Ref Rng & Units 07/19/2020 09/20/2020 10/03/2020  CK Total     7 - 232 U/L 457 (H) 402 (H) 672 (H)   Lab Results  Component Value Date   ESRSEDRATE 10 10/03/2020   Lab Results  Component Value Date   TSH 2.80 01/18/2020   Lab Results  Component Value Date   ESRSEDRATE 10 10/03/2020    Past Medical History:  Diagnosis Date  . Allergy    SEASONAL  . Arthritis   . Asthma   . COLONIC POLYPS, HX OF 03/12/2007  . [place] DISEASE, LUMBAR 12/03/2008  . DIVERTICULOSIS, COLON 10/17/2009  . Hydrocele   . HYPERLIPIDEMIA 03/12/2007  . Melanoma ([place]) 04/23/2020   in situ-Left Upper Arm  . [place], RIGHT 03/13/2008  . SCC  (squamous cell carcinoma) 04/26/2013   right hand-txpbx  . SKIN CANCER, HX OF 10/17/2009  . VERTIGO, BENIGN PAROXYSMAL POSITION 04/28/2007    Past Surgical History:  Procedure Laterality Date  . COLONOSCOPY W/ BIOPSIES    . MOHS SURGERY    . SPINE SURGERY     lumbar disk surgery     Medications:  Outpatient Encounter Medications as of 10/29/2020  Medication Sig  . aspirin 81 MG tablet Take 81 mg by mouth daily.  Marland Kitchen co-enzyme Q-10 50 MG capsule Take 1 capsule (50 mg total) by mouth daily.  . fluticasone (FLONASE SENSIMIST) 27.5 MCG/SPRAY nasal spray Place 2 sprays into the nose daily.  . Multiple Vitamins-Minerals (MENS MULTI VITAMIN & MINERAL PO) Take by mouth daily.  . Omega-3 Fatty Acids (FISH OIL) 1000 MG CAPS 2 caps twice daily  . simvastatin (ZOCOR) 40 MG tablet TAKE ONE TABLET BY MOUTH ONE TIME DAILY  . tamsulosin (FLOMAX) 0.4 MG CAPS capsule Take 0.4 mg by mouth daily.  . Turmeric (QC TUMERIC COMPLEX) 500 MG CAPS Taking daily   No facility-administered encounter medications on file as of 10/29/2020.    Allergies: No Known Allergies  Family History: Family History  Problem Relation Age of Onset  . Lung cancer Mother   . Ovarian cancer Mother   . COPD  Father   . Heart attack Father   . Alcohol abuse Brother   . Lung cancer Brother        smoker  . Breast cancer Daughter   . Breast cancer Daughter   . Colon cancer Neg Hx   . Esophageal cancer Neg Hx   . Rectal cancer Neg Hx   . Stomach cancer Neg Hx     Social History: Social History   Tobacco Use  . Smoking status: Former Smoker    Packs/day: 1.00    Years: 20.00    Pack years: 20.00    Types: Cigarettes    Quit date: 10/30/1978    Years since quitting: 42.0  . Smokeless tobacco: Never Used  Vaping Use  . Vaping Use: Never used  Substance Use Topics  . Alcohol use: Yes    Comment: 1 burben and 1 beer   . Drug use: No   Social History   Social History Narrative   Retired from [employer]  at [employer]   Enjoys golfing   Three daughters   Married     Right Handed    Has 2 granddaughters 1 grandson     Vital Signs:  BP 125/72   Pulse 70   Ht 5\' 10"  (1.778 m)   Wt 243 lb (110.2 kg)   SpO2 94%   BMI 34.87 kg/m    Neurological Exam: MENTAL STATUS including orientation to time, place, person, recent and remote memory, attention span and concentration, language, and fund of knowledge is normal.  Speech is not dysarthric.  CRANIAL NERVES: II:  No visual field defects.   III-IV-VI: Pupils equal round and reactive to light.  Normal conjugate, extra-ocular eye movements in all directions of gaze.  No nystagmus.  No ptosis.   V:  Normal facial sensation.    VII:  Normal facial symmetry and movements.   VIII:  Normal hearing and vestibular function.   IX-X:  Normal palatal movement.   XI:  Normal shoulder shrug and head rotation.   XII:  Normal tongue strength and range of motion, no deviation or fasciculation.  MOTOR:  No atrophy, fasciculations or abnormal movements.  No pronator drift.   Upper Extremity:  Right  Left  Deltoid  5/5   5/5   Biceps  5/5   5/5   Triceps  5/5   5/5   Infraspinatus 5/5  5/5  Medial pectoralis 5/5  5/5  Wrist extensors  5/5   5/5   Wrist flexors  5/5   5/5   Finger extensors  5/5   5/5   Finger flexors  5/5   5/5   Dorsal interossei  5/5   5/5   Abductor pollicis  5/5   5/5   Tone (Ashworth scale)  0  0   Lower Extremity:  Right  Left  Hip flexors  5/5   5/5   Hip extensors  5/5   5/5   Adductor 5/5  5/5  Abductor 5/5  5/5  Knee flexors  5/5   5/5   Knee extensors  5/5   5/5   Dorsiflexors  5/5   5/5   Plantarflexors  5/5   5/5   Toe extensors  5/5   5/5   Toe flexors  5/5   5/5   Tone (Ashworth scale)  0  0   MSRs:  Right        Left  brachioradialis 2+  2+  biceps 2+  2+  triceps 2+  2+  patellar 1+  1+  ankle jerk 1+  1+  Hoffman no  no  plantar response down  down   SENSORY:  Normal and  symmetric perception of light touch, pinprick, vibration, and proprioception.  Romberg's sign absent.   COORDINATION/GAIT: Normal finger-to- nose-finger.  Intact rapid alternating movements bilaterally.  Gait narrow based and stable. Tandem and stressed gait intact.    IMPRESSION: 1. Bilateral leg numbness, ?L5-S1 radiculopathy. He has history of lumbar surgery and may have new spondylosis above or below his prior fusion  - MRI lumbar spine wo contrast  2. HyperCKemia.  No evidence of weakness on exam which makes myopathy less likely.  Inflammmatory markers have been normal. He denies myalgias.  No change with trial off statin therapy.  With the absence of weakness and pain, I am ok with him staying on statin therapy.  If myopathic changes are seen on EMG, it will need to be stopped and additional labs to be checked.  - Check CK, aldolase, vitamin B12, myositis panel  - NCS/EMG of the legs  Further recommendations pending results.    Thank you for allowing me to participate in patient's care.  If I can answer any additional questions, I would be pleased to do so.    Sincerely,    Ivon Roedel K. Posey Pronto, DO

## 2020-11-07 LAB — MYOSITIS ASSESSR PLUS JO-1 AUTOABS
EJ Autoabs: NOT DETECTED
Jo-1 Autoabs: <1 AI
Ku Autoabs: NOT DETECTED
Mi-2 Autoabs: NOT DETECTED
OJ Autoabs: NOT DETECTED
PL-12 Autoabs: NOT DETECTED
PL-7 Autoabs: NOT DETECTED
SRP Autoabs: NOT DETECTED

## 2020-11-08 ENCOUNTER — Telehealth: Payer: Self-pay

## 2020-11-08 DIAGNOSIS — M48061 Spinal stenosis, lumbar region without neurogenic claudication: Secondary | ICD-10-CM | POA: Diagnosis not present

## 2020-11-08 DIAGNOSIS — M47816 Spondylosis without myelopathy or radiculopathy, lumbar region: Secondary | ICD-10-CM | POA: Diagnosis not present

## 2020-11-08 DIAGNOSIS — M4316 Spondylolisthesis, lumbar region: Secondary | ICD-10-CM | POA: Diagnosis not present

## 2020-11-08 DIAGNOSIS — M7138 Other bursal cyst, other site: Secondary | ICD-10-CM | POA: Diagnosis not present

## 2020-11-08 NOTE — Telephone Encounter (Signed)
Patient called and informed that  lab are within normal limits

## 2020-11-08 NOTE — Telephone Encounter (Signed)
-----   Message from Alda Berthold, DO sent at 11/08/2020 11:30 AM EDT ----- Please notify patient lab are within normal limits.  Thank you.

## 2020-11-12 ENCOUNTER — Telehealth: Payer: Self-pay | Admitting: Neurology

## 2020-11-12 DIAGNOSIS — M48062 Spinal stenosis, lumbar region with neurogenic claudication: Secondary | ICD-10-CM

## 2020-11-12 NOTE — Telephone Encounter (Signed)
Called and informed patient that his MRI lumbar spine shows severe spinal canal stenosis at L4-5, which would explain his leg symptoms.  I will refer him to [facility].  He was informed to take his images on CD to his appointment.

## 2020-11-13 ENCOUNTER — Other Ambulatory Visit: Payer: Self-pay

## 2020-11-13 DIAGNOSIS — M48062 Spinal stenosis, lumbar region with neurogenic claudication: Secondary | ICD-10-CM

## 2020-11-13 NOTE — Telephone Encounter (Signed)
Referral created and faxed to [facility].

## 2020-11-19 DIAGNOSIS — M7138 Other bursal cyst, other site: Secondary | ICD-10-CM | POA: Diagnosis not present

## 2020-11-19 DIAGNOSIS — M5416 Radiculopathy, lumbar region: Secondary | ICD-10-CM | POA: Diagnosis not present

## 2020-12-12 ENCOUNTER — Encounter: Payer: Medicare Other | Admitting: Neurology

## 2020-12-13 ENCOUNTER — Ambulatory Visit: Payer: Medicare Other | Admitting: Neurology

## 2021-01-08 ENCOUNTER — Encounter: Payer: Medicare Other | Admitting: Neurology

## 2021-01-31 DIAGNOSIS — H5201 Hypermetropia, right eye: Secondary | ICD-10-CM | POA: Diagnosis not present

## 2021-02-06 DIAGNOSIS — M7138 Other bursal cyst, other site: Secondary | ICD-10-CM | POA: Diagnosis not present

## 2021-02-06 DIAGNOSIS — M5416 Radiculopathy, lumbar region: Secondary | ICD-10-CM | POA: Diagnosis not present

## 2021-02-06 DIAGNOSIS — M48061 Spinal stenosis, lumbar region without neurogenic claudication: Secondary | ICD-10-CM | POA: Diagnosis not present

## 2021-02-13 ENCOUNTER — Other Ambulatory Visit: Payer: Self-pay | Admitting: Neurological Surgery

## 2021-02-16 ENCOUNTER — Other Ambulatory Visit: Payer: Self-pay | Admitting: Internal Medicine

## 2021-02-18 ENCOUNTER — Other Ambulatory Visit: Payer: Self-pay | Admitting: Neurological Surgery

## 2021-02-25 NOTE — Progress Notes (Signed)
Surgical Instructions   Your procedure is scheduled on Friday July, 29, 2022.  Report to [facility] Main Entrance "A" at 10:30 A.M., then check in with the Admitting office.  Call [phone] if you have problems or questions between now and the morning of surgery:   Remember: Do not eat or drink after midnight the night before your surgery  Take these medicines the morning of surgery with A SIP OF WATER: None   If needed you may take these medications the morning of surgery: Fexofenadine (Allegra) Fluticasone (Flonase)    As of today, STOP taking any Aspirin (unless otherwise instructed by your surgeon) or Aspirin-containing products; NSAIDS - Aleve, Naproxen, Ibuprofen, Motrin, Advil, Goody's, BC's, all herbal medications, fish oil, and all vitamins.  Follow your surgeon's instructions on when to stop Aspirin.  If no instructions were given by your surgeon then you will need to call the office to get those instructions.            Do not wear jewelry or makeup Do not wear lotions, powders, perfumes, or deodorant. Do not shave 48 hours prior to surgery.  Do not wear nail polish, gel polish, artificial nails, or any other type of covering on natural nails including fingernails and toenails. If patients have artificial nails, gel coating, etc. that need to be removed by a nail salon please have this removed prior to surgery or surgery may need to be canceled/delayed if the surgeon/ anesthesia feels like the patient is unable to be adequately monitored. Do not bring valuables to the hospital - [facility] is not responsible for any belongings or valuables.              Do NOT Smoke (Tobacco/Vaping) or drink Alcohol 24 hours prior to your procedure  If you use a CPAP at night, you may bring all equipment for your overnight stay.   Contacts, glasses, hearing aids, dentures or partials may not be worn into surgery, please bring cases for these belongings   For patients admitted to  the hospital, discharge time will be determined by your treatment team.   Patients discharged the day of surgery will not be allowed to drive home, and someone needs to stay with them for 24 hours.  ONLY ONE (1) SUPPORT PERSON MAY WAIT IN THE WAITING AREA WHILE YOU ARE IN SURGERY. NO VISITORS WILL BE ALLOWED IN PRE-OP WHERE PATIENTS GET READY FOR SURGERY.  TWO (2) VISITORS WILL BE ALLOWED IN YOUR ROOM IF YOU ARE ADMITTED AFTER SURGERY.  Minor children may have two parents present. Special consideration for safety and communication needs will be reviewed on a case by case basis.  Special instructions:    Oral Hygiene is also important to reduce your risk of infection.  Remember - BRUSH YOUR TEETH THE MORNING OF SURGERY WITH YOUR REGULAR TOOTHPASTE   [place]- Preparing For Surgery  Before surgery, you can play an important role. Because skin is not sterile, your skin needs to be as free of germs as possible. You can reduce the number of germs on your skin by washing with CHG (chlorahexidine gluconate) Soap before surgery.  CHG is an antiseptic cleaner which kills germs and bonds with the skin to continue killing germs even after washing.     Please do not use if you have an allergy to CHG or antibacterial soaps. If your skin becomes reddened/irritated stop using the CHG.  Do not shave (including legs and underarms) for at least 48 hours prior  to first CHG shower. It is OK to shave your face.  Please follow these instructions carefully.     Shower the NIGHT BEFORE SURGERY and the MORNING OF SURGERY with CHG Soap.   If you chose to wash your hair, wash your hair first as usual with your normal shampoo. After you shampoo, rinse your hair and body thoroughly to remove the shampoo.    Then [employer] and genitals (private parts) with your normal soap and rinse thoroughly to remove soap.  Next use the CHG Soap as you would any other liquid soap. You can apply CHG directly to the skin and wash  gently with a clean washcloth.   Apply the CHG Soap to your body ONLY FROM THE NECK DOWN.  Do not use on open wounds or open sores. Avoid contact with your eyes, ears, mouth and genitals (private parts). Wash Face and genitals (private parts)  with your normal soap.   Wash thoroughly, paying special attention to the area where your surgery will be performed.  Thoroughly rinse your body with warm water from the neck down.  DO NOT shower/wash with your normal soap after using and rinsing off the CHG Soap.  Pat yourself dry with a CLEAN TOWEL.  Wear CLEAN PAJAMAS to bed the night before surgery  Place CLEAN SHEETS on your bed the night before your surgery  DO NOT SLEEP WITH PETS.   Day of Surgery:  Take a shower with CHG soap. Wear Clean/Comfortable clothing the morning of surgery Do not apply any deodorants/lotions.   Remember to brush your teeth WITH YOUR REGULAR TOOTHPASTE.   Please read over the following fact sheets that you were given.

## 2021-02-26 ENCOUNTER — Encounter (HOSPITAL_COMMUNITY)
Admission: RE | Admit: 2021-02-26 | Discharge: 2021-02-26 | Disposition: A | Discharge status: Home / Self Care | Payer: Medicare Other | Source: Ambulatory Visit | Attending: Neurological Surgery | Admitting: Neurological Surgery

## 2021-02-26 ENCOUNTER — Other Ambulatory Visit: Payer: Self-pay

## 2021-02-26 ENCOUNTER — Encounter (HOSPITAL_COMMUNITY): Payer: Self-pay

## 2021-02-26 DIAGNOSIS — Z20822 Contact with and (suspected) exposure to covid-19: Secondary | ICD-10-CM | POA: Insufficient documentation

## 2021-02-26 DIAGNOSIS — Z01812 Encounter for preprocedural laboratory examination: Secondary | ICD-10-CM | POA: Insufficient documentation

## 2021-02-26 HISTORY — DX: Personal history of urinary calculi: Z87.442

## 2021-02-26 LAB — BASIC METABOLIC PANEL
Anion gap: 10 (ref 5–15)
BUN: 16 mg/dL (ref 8–23)
CO2: 25 mmol/L (ref 22–32)
Calcium: 9.1 mg/dL (ref 8.9–10.3)
Chloride: 105 mmol/L (ref 98–111)
Creatinine, Ser: 0.9 mg/dL (ref 0.61–1.24)
GFR, Estimated: >60 mL/min (ref >60)
Glucose, Bld: 118 mg/dL — ABNORMAL HIGH (ref 70–99)
Potassium: 4.5 mmol/L (ref 3.5–5.1)
Sodium: 140 mmol/L (ref 135–145)

## 2021-02-26 LAB — CBC
HCT: 45.2 % (ref 39.0–52.0)
Hemoglobin: 15.5 g/dL (ref 13.0–17.0)
MCH: 31.7 pg (ref 26.0–34.0)
MCHC: 34.3 g/dL (ref 30.0–36.0)
MCV: 92.4 fL (ref 80.0–100.0)
Platelets: 205 10*3/uL (ref 150–400)
RBC: 4.89 MIL/uL (ref 4.22–5.81)
RDW: 12 % (ref 11.5–15.5)
WBC: 7.3 10*3/uL (ref 4.0–10.5)
nRBC: 0 % (ref 0.0–0.2)

## 2021-02-26 LAB — SARS CORONAVIRUS 2 (TAT 6-24 HRS): SARS Coronavirus 2: NEGATIVE

## 2021-02-26 LAB — SURGICAL PCR SCREEN
MRSA, PCR: NEGATIVE
Staphylococcus aureus: POSITIVE — AB

## 2021-02-26 NOTE — Progress Notes (Signed)
PCP - Billey Gosling, MD Cardiologist - denies  PPM/ICD - denies Device Orders - N/A Rep Notified - N/A  Chest x-ray - N/A EKG - N/A Stress Test - denies ECHO - denies Cardiac Cath - denies  Sleep Study - denies CPAP - N/A  Fasting Blood Sugar - N/A  Blood Thinner Instructions: N/A Aspirin Instructions: Last dose - 02/05/2021 per patient  Patient was instructed: As of today, STOP taking any Aspirin (unless otherwise instructed by your surgeon) or Aspirin-containing products; NSAIDS - Aleve, Naproxen, Ibuprofen, Motrin, Advil, Goody's, BC's, all herbal medications, fish oil, and all vitamins.  ERAS Protcol - No PRE-SURGERY Ensure or G2- N/A  COVID TEST- 02/26/2021   Anesthesia review: no  Patient denies shortness of breath, fever, cough and chest pain at PAT appointment   All instructions explained to the patient, with a verbal understanding of the material. Patient agrees to go over the instructions while at home for a better understanding. Patient also instructed to self quarantine after being tested for COVID-19. The opportunity to ask questions was provided.

## 2021-03-01 ENCOUNTER — Encounter (HOSPITAL_COMMUNITY)
Admission: RE | Disposition: A | Discharge status: Home / Self Care | Payer: Self-pay | Source: Home / Self Care | Attending: Neurological Surgery

## 2021-03-01 ENCOUNTER — Ambulatory Visit (HOSPITAL_COMMUNITY): Payer: Medicare Other

## 2021-03-01 ENCOUNTER — Ambulatory Visit (HOSPITAL_COMMUNITY): Payer: Medicare Other | Admitting: Anesthesiology

## 2021-03-01 ENCOUNTER — Ambulatory Visit (HOSPITAL_COMMUNITY)
Admission: RE | Admit: 2021-03-01 | Discharge: 2021-03-01 | Disposition: A | Discharge status: Home / Self Care | Payer: Medicare Other | Attending: Neurological Surgery | Admitting: Neurological Surgery

## 2021-03-01 ENCOUNTER — Other Ambulatory Visit: Payer: Self-pay

## 2021-03-01 ENCOUNTER — Encounter (HOSPITAL_COMMUNITY): Payer: Self-pay | Admitting: Neurological Surgery

## 2021-03-01 DIAGNOSIS — M48061 Spinal stenosis, lumbar region without neurogenic claudication: Secondary | ICD-10-CM | POA: Insufficient documentation

## 2021-03-01 DIAGNOSIS — Z7982 Long term (current) use of aspirin: Secondary | ICD-10-CM | POA: Diagnosis not present

## 2021-03-01 DIAGNOSIS — Z85820 Personal history of malignant melanoma of skin: Secondary | ICD-10-CM | POA: Diagnosis not present

## 2021-03-01 DIAGNOSIS — Z981 Arthrodesis status: Secondary | ICD-10-CM | POA: Diagnosis not present

## 2021-03-01 DIAGNOSIS — W19XXXA Unspecified fall, initial encounter: Secondary | ICD-10-CM | POA: Diagnosis not present

## 2021-03-01 DIAGNOSIS — Z87891 Personal history of nicotine dependence: Secondary | ICD-10-CM | POA: Insufficient documentation

## 2021-03-01 DIAGNOSIS — M7138 Other bursal cyst, other site: Secondary | ICD-10-CM | POA: Diagnosis not present

## 2021-03-01 DIAGNOSIS — Z79899 Other long term (current) drug therapy: Secondary | ICD-10-CM | POA: Insufficient documentation

## 2021-03-01 DIAGNOSIS — I700 Atherosclerosis of aorta: Secondary | ICD-10-CM | POA: Diagnosis not present

## 2021-03-01 DIAGNOSIS — M5416 Radiculopathy, lumbar region: Secondary | ICD-10-CM | POA: Diagnosis not present

## 2021-03-01 DIAGNOSIS — Z419 Encounter for procedure for purposes other than remedying health state, unspecified: Secondary | ICD-10-CM

## 2021-03-01 HISTORY — PX: LUMBAR LAMINECTOMY/ DECOMPRESSION WITH MET-RX: SHX5959

## 2021-03-01 SURGERY — LUMBAR LAMINECTOMY/ DECOMPRESSION WITH MET-RX
Anesthesia: General | Site: Spine Lumbar | Laterality: Left

## 2021-03-01 MED ORDER — THROMBIN 5000 UNITS EX SOLR: OROMUCOSAL | Status: DC | PRN

## 2021-03-01 MED ORDER — HYDROCODONE-ACETAMINOPHEN 5-325 MG PO TABS: 1.0000 | ORAL_TABLET | Freq: Four times a day (QID) | ORAL | 0 refills | Status: DC | PRN

## 2021-03-01 MED ORDER — ACETAMINOPHEN 10 MG/ML IV SOLN
INTRAVENOUS | Status: AC
  Filled 2021-03-01: qty 100

## 2021-03-01 MED ORDER — LACTATED RINGERS IV SOLN: INTRAVENOUS | Status: DC

## 2021-03-01 MED ORDER — CEFAZOLIN SODIUM-DEXTROSE 2-4 GM/100ML-% IV SOLN
INTRAVENOUS | Status: AC
  Filled 2021-03-01: qty 100

## 2021-03-01 MED ORDER — CHLORHEXIDINE GLUCONATE CLOTH 2 % EX PADS: 6.0000 | MEDICATED_PAD | Freq: Once | CUTANEOUS | Status: DC

## 2021-03-01 MED ORDER — SUGAMMADEX SODIUM 200 MG/2ML IV SOLN
INTRAVENOUS | Status: DC | PRN
  Administered 2021-03-01: 400 mg via INTRAVENOUS

## 2021-03-01 MED ORDER — FENTANYL CITRATE (PF) 100 MCG/2ML IJ SOLN: 25.0000 ug | INTRAMUSCULAR | Status: DC | PRN

## 2021-03-01 MED ORDER — ACETAMINOPHEN 500 MG PO TABS: 1000.0000 mg | ORAL_TABLET | Freq: Once | ORAL | Status: DC | PRN

## 2021-03-01 MED ORDER — ORAL CARE MOUTH RINSE: 15.0000 mL | Freq: Once | OROMUCOSAL | Status: AC

## 2021-03-01 MED ORDER — ACETAMINOPHEN 160 MG/5ML PO SOLN: 1000.0000 mg | Freq: Once | ORAL | Status: DC | PRN

## 2021-03-01 MED ORDER — BUPIVACAINE-EPINEPHRINE (PF) 0.5% -1:200000 IJ SOLN
INTRAMUSCULAR | Status: DC | PRN
  Administered 2021-03-01: 10 mL

## 2021-03-01 MED ORDER — EPHEDRINE SULFATE-NACL 50-0.9 MG/10ML-% IV SOSY
PREFILLED_SYRINGE | INTRAVENOUS | Status: DC | PRN
  Administered 2021-03-01 (×2): 5 mg via INTRAVENOUS
  Administered 2021-03-01: 10 mg via INTRAVENOUS

## 2021-03-01 MED ORDER — METHYLPREDNISOLONE ACETATE 80 MG/ML IJ SUSP
INTRAMUSCULAR | Status: DC | PRN
  Administered 2021-03-01: 80 mg

## 2021-03-01 MED ORDER — HEMOSTATIC AGENTS (NO CHARGE) OPTIME
TOPICAL | Status: DC | PRN
  Administered 2021-03-01: 1 via TOPICAL

## 2021-03-01 MED ORDER — SUFENTANIL CITRATE 50 MCG/ML IV SOLN
INTRAVENOUS | Status: DC | PRN
  Administered 2021-03-01: 15 ug via INTRAVENOUS
  Administered 2021-03-01: 5 ug via INTRAVENOUS

## 2021-03-01 MED ORDER — PROPOFOL 10 MG/ML IV BOLUS
INTRAVENOUS | Status: AC
  Filled 2021-03-01: qty 20

## 2021-03-01 MED ORDER — PHENYLEPHRINE HCL (PRESSORS) 10 MG/ML IV SOLN
INTRAVENOUS | Status: AC
  Filled 2021-03-01: qty 1

## 2021-03-01 MED ORDER — ROCURONIUM BROMIDE 10 MG/ML (PF) SYRINGE
PREFILLED_SYRINGE | INTRAVENOUS | Status: AC
  Filled 2021-03-01: qty 10

## 2021-03-01 MED ORDER — ONDANSETRON HCL 4 MG/2ML IJ SOLN
INTRAMUSCULAR | Status: AC
  Filled 2021-03-01: qty 2

## 2021-03-01 MED ORDER — OXYCODONE HCL 5 MG PO TABS
ORAL_TABLET | ORAL | Status: AC
  Filled 2021-03-01: qty 1

## 2021-03-01 MED ORDER — THROMBIN 5000 UNITS EX SOLR
CUTANEOUS | Status: AC
  Filled 2021-03-01: qty 5000

## 2021-03-01 MED ORDER — SUFENTANIL CITRATE 50 MCG/ML IV SOLN
INTRAVENOUS | Status: AC
  Filled 2021-03-01: qty 1

## 2021-03-01 MED ORDER — PROPOFOL 10 MG/ML IV BOLUS
INTRAVENOUS | Status: DC | PRN
  Administered 2021-03-01: 170 mg via INTRAVENOUS

## 2021-03-01 MED ORDER — CEFAZOLIN SODIUM-DEXTROSE 2-4 GM/100ML-% IV SOLN
2.0000 g | INTRAVENOUS | Status: AC
  Administered 2021-03-01: 2 g via INTRAVENOUS

## 2021-03-01 MED ORDER — EPHEDRINE 5 MG/ML INJ
INTRAVENOUS | Status: AC
  Filled 2021-03-01: qty 5

## 2021-03-01 MED ORDER — ROCURONIUM BROMIDE 10 MG/ML (PF) SYRINGE
PREFILLED_SYRINGE | INTRAVENOUS | Status: DC | PRN
  Administered 2021-03-01: 70 mg via INTRAVENOUS
  Administered 2021-03-01: 10 mg via INTRAVENOUS
  Administered 2021-03-01: 20 mg via INTRAVENOUS

## 2021-03-01 MED ORDER — PHENYLEPHRINE HCL-NACL 10-0.9 MG/250ML-% IV SOLN
INTRAVENOUS | Status: DC | PRN
  Administered 2021-03-01: 25 ug/min via INTRAVENOUS

## 2021-03-01 MED ORDER — BUPIVACAINE-EPINEPHRINE 0.5% -1:200000 IJ SOLN
INTRAMUSCULAR | Status: AC
  Filled 2021-03-01: qty 1

## 2021-03-01 MED ORDER — DEXAMETHASONE SODIUM PHOSPHATE 10 MG/ML IJ SOLN
INTRAMUSCULAR | Status: DC | PRN
  Administered 2021-03-01: 5 mg via INTRAVENOUS

## 2021-03-01 MED ORDER — 0.9 % SODIUM CHLORIDE (POUR BTL) OPTIME
TOPICAL | Status: DC | PRN
  Administered 2021-03-01: 1000 mL

## 2021-03-01 MED ORDER — METHYLPREDNISOLONE ACETATE 80 MG/ML IJ SUSP
INTRAMUSCULAR | Status: AC
  Filled 2021-03-01: qty 1

## 2021-03-01 MED ORDER — ACETAMINOPHEN 10 MG/ML IV SOLN
1000.0000 mg | Freq: Once | INTRAVENOUS | Status: DC | PRN
  Administered 2021-03-01: 1000 mg via INTRAVENOUS

## 2021-03-01 MED ORDER — PHENYLEPHRINE 40 MCG/ML (10ML) SYRINGE FOR IV PUSH (FOR BLOOD PRESSURE SUPPORT)
PREFILLED_SYRINGE | INTRAVENOUS | Status: AC
  Filled 2021-03-01: qty 10

## 2021-03-01 MED ORDER — CHLORHEXIDINE GLUCONATE 0.12 % MT SOLN
OROMUCOSAL | Status: AC
  Administered 2021-03-01: 15 mL via OROMUCOSAL
  Filled 2021-03-01: qty 15

## 2021-03-01 MED ORDER — CHLORHEXIDINE GLUCONATE 0.12 % MT SOLN: 15.0000 mL | Freq: Once | OROMUCOSAL | Status: AC

## 2021-03-01 MED ORDER — MIDAZOLAM HCL 2 MG/2ML IJ SOLN
INTRAMUSCULAR | Status: AC
  Filled 2021-03-01: qty 2

## 2021-03-01 MED ORDER — DEXAMETHASONE SODIUM PHOSPHATE 10 MG/ML IJ SOLN
INTRAMUSCULAR | Status: AC
  Filled 2021-03-01: qty 1

## 2021-03-01 MED ORDER — OXYCODONE HCL 5 MG/5ML PO SOLN
5.0000 mg | Freq: Once | ORAL | Status: AC | PRN
Start: 2021-03-01 — End: 2021-03-01

## 2021-03-01 MED ORDER — MIDAZOLAM HCL 2 MG/2ML IJ SOLN
INTRAMUSCULAR | Status: DC | PRN
Start: 2021-03-01 — End: 2021-03-01
  Administered 2021-03-01: 2 mg via INTRAVENOUS

## 2021-03-01 MED ORDER — OXYCODONE HCL 5 MG PO TABS
5.0000 mg | ORAL_TABLET | Freq: Once | ORAL | Status: AC | PRN
Start: 2021-03-01 — End: 2021-03-01
  Administered 2021-03-01: 5 mg via ORAL

## 2021-03-01 MED ORDER — ONDANSETRON HCL 4 MG/2ML IJ SOLN
INTRAMUSCULAR | Status: DC | PRN
  Administered 2021-03-01: 4 mg via INTRAVENOUS

## 2021-03-01 MED ORDER — PHENYLEPHRINE 40 MCG/ML (10ML) SYRINGE FOR IV PUSH (FOR BLOOD PRESSURE SUPPORT)
PREFILLED_SYRINGE | INTRAVENOUS | Status: DC | PRN
  Administered 2021-03-01: 120 ug via INTRAVENOUS

## 2021-03-01 MED ORDER — LIDOCAINE-EPINEPHRINE 1 %-1:100000 IJ SOLN
INTRAMUSCULAR | Status: AC
  Filled 2021-03-01: qty 1

## 2021-03-01 MED ORDER — LIDOCAINE-EPINEPHRINE 1 %-1:100000 IJ SOLN
INTRAMUSCULAR | Status: DC | PRN
  Administered 2021-03-01: 10 mL

## 2021-03-01 SURGICAL SUPPLY — 67 items
ADH SKN CLS APL DERMABOND .7 (GAUZE/BANDAGES/DRESSINGS) ×1
BAG COUNTER SPONGE SURGICOUNT (BAG) ×2 IMPLANT
BAG SPNG CNTER NS LX DISP (BAG) ×1
BAND INSRT 18 STRL LF DISP RB (MISCELLANEOUS) ×2
BAND RUBBER #18 3X1/16 STRL (MISCELLANEOUS) ×4 IMPLANT
BLADE SURG 11 STRL SS (BLADE) ×2 IMPLANT
BUR MATCHSTICK NEURO 3.0 LAGG (BURR) ×2 IMPLANT
BUR RND OSTEON ELITE 6.0 (BURR) ×2 IMPLANT
CARTRIDGE OIL MAESTRO DRILL (MISCELLANEOUS) ×1 IMPLANT
CNTNR URN SCR LID CUP LEK RST (MISCELLANEOUS) IMPLANT
CONT SPEC 4OZ STRL OR WHT (MISCELLANEOUS) ×2
COVER BACK TABLE 60X90IN (DRAPES) ×2 IMPLANT
COVER MAYO STAND STRL (DRAPES) ×2 IMPLANT
DECANTER SPIKE VIAL GLASS SM (MISCELLANEOUS) ×2 IMPLANT
DERMABOND ADVANCED (GAUZE/BANDAGES/DRESSINGS) ×1
DERMABOND ADVANCED .7 DNX12 (GAUZE/BANDAGES/DRESSINGS) ×1 IMPLANT
DIFFUSER DRILL AIR PNEUMATIC (MISCELLANEOUS) ×1 IMPLANT
DRAIN JACKSON RD 7FR 3/32 (WOUND CARE) IMPLANT
DRAPE C-ARM 42X72 X-RAY (DRAPES) ×2 IMPLANT
DRAPE LAPAROTOMY 100X72X124 (DRAPES) ×2 IMPLANT
DRAPE MICROSCOPE LEICA (MISCELLANEOUS) ×2 IMPLANT
DRAPE SURG 17X23 STRL (DRAPES) ×2 IMPLANT
DRSG OPSITE POSTOP 3X4 (GAUZE/BANDAGES/DRESSINGS) ×2 IMPLANT
DURAPREP 26ML APPLICATOR (WOUND CARE) ×2 IMPLANT
ELECT BLADE 6.5 EXT (BLADE) ×2 IMPLANT
ELECT BLADE INSULATED 6.5IN (ELECTROSURGICAL) ×2
ELECT REM PT RETURN 9FT ADLT (ELECTROSURGICAL) ×2
ELECTRODE BLDE INSULATED 6.5IN (ELECTROSURGICAL) ×1 IMPLANT
ELECTRODE REM PT RTRN 9FT ADLT (ELECTROSURGICAL) ×1 IMPLANT
GAUZE 4X4 16PLY ~~LOC~~+RFID DBL (SPONGE) ×1 IMPLANT
GLOVE SRG 8 PF TXTR STRL LF DI (GLOVE) ×2 IMPLANT
GLOVE SURG LTX SZ8 (GLOVE) ×4 IMPLANT
GLOVE SURG UNDER POLY LF SZ6.5 (GLOVE) ×5 IMPLANT
GLOVE SURG UNDER POLY LF SZ7 (GLOVE) ×3 IMPLANT
GLOVE SURG UNDER POLY LF SZ8 (GLOVE) ×4
GOWN STRL REUS W/ TWL LRG LVL3 (GOWN DISPOSABLE) IMPLANT
GOWN STRL REUS W/ TWL XL LVL3 (GOWN DISPOSABLE) ×1 IMPLANT
GOWN STRL REUS W/TWL 2XL LVL3 (GOWN DISPOSABLE) IMPLANT
GOWN STRL REUS W/TWL LRG LVL3 (GOWN DISPOSABLE) ×4
GOWN STRL REUS W/TWL XL LVL3 (GOWN DISPOSABLE) ×4
HEMOSTAT POWDER KIT SURGIFOAM (HEMOSTASIS) ×2 IMPLANT
KIT BASIN OR (CUSTOM PROCEDURE TRAY) ×2 IMPLANT
KIT POSITION SURG JACKSON T1 (MISCELLANEOUS) ×2 IMPLANT
KIT TURNOVER KIT B (KITS) ×2 IMPLANT
MARKER SKIN DUAL TIP RULER LAB (MISCELLANEOUS) ×2 IMPLANT
NDL HYPO 25X1 1.5 SAFETY (NEEDLE) ×1 IMPLANT
NDL SPNL 18GX3.5 QUINCKE PK (NEEDLE) ×1 IMPLANT
NEEDLE HYPO 25X1 1.5 SAFETY (NEEDLE) ×2 IMPLANT
NEEDLE SPNL 18GX3.5 QUINCKE PK (NEEDLE) ×2 IMPLANT
NS IRRIG 1000ML POUR BTL (IV SOLUTION) ×2 IMPLANT
OIL CARTRIDGE MAESTRO DRILL (MISCELLANEOUS)
PACK LAMINECTOMY NEURO (CUSTOM PROCEDURE TRAY) ×2 IMPLANT
PAD ARMBOARD 7.5X6 YLW CONV (MISCELLANEOUS) ×6 IMPLANT
PATTIES SURGICAL .5 X.5 (GAUZE/BANDAGES/DRESSINGS) IMPLANT
PATTIES SURGICAL .5 X1 (DISPOSABLE) IMPLANT
PATTIES SURGICAL 1X1 (DISPOSABLE) IMPLANT
SPONGE SURGIFOAM ABS GEL 12-7 (HEMOSTASIS) ×2 IMPLANT
SPONGE T-LAP 4X18 ~~LOC~~+RFID (SPONGE) ×2 IMPLANT
STAPLER VISISTAT 35W (STAPLE) IMPLANT
SUT VIC AB 0 CT1 27 (SUTURE)
SUT VIC AB 0 CT1 27XBRD ANBCTR (SUTURE) IMPLANT
SUT VIC AB 2-0 CP2 18 (SUTURE) ×2 IMPLANT
SUT VIC AB 3-0 SH 8-18 (SUTURE) ×2 IMPLANT
TOWEL GREEN STERILE (TOWEL DISPOSABLE) ×1 IMPLANT
TOWEL GREEN STERILE FF (TOWEL DISPOSABLE) ×1 IMPLANT
TRAY FOLEY MTR SLVR 16FR STAT (SET/KITS/TRAYS/PACK) IMPLANT
WATER STERILE IRR 1000ML POUR (IV SOLUTION) ×2 IMPLANT

## 2021-03-01 NOTE — Anesthesia Procedure Notes (Signed)
Procedure Name: Intubation Date/Time: 03/01/2021 12:14 PM Performed by: Moshe Salisbury, CRNA Pre-anesthesia Checklist: Patient identified, Emergency Drugs available, Suction available and Patient being monitored Patient Re-evaluated:Patient Re-evaluated prior to induction Oxygen Delivery Method: Circle System Utilized Preoxygenation: Pre-oxygenation with 100% oxygen Induction Type: IV induction Ventilation: Mask ventilation without difficulty Laryngoscope Size: Mac and 4 Grade View: Grade III Tube type: Oral Tube size: 8.0 mm Number of attempts: 1 Airway Equipment and Method: Stylet Placement Confirmation: ETT inserted through vocal cords under direct vision, positive ETCO2 and breath sounds checked- equal and bilateral Secured at: 22 cm Tube secured with: Tape Dental Injury: Teeth and Oropharynx as per pre-operative assessment

## 2021-03-01 NOTE — H&P (Signed)
Providing Compassionate, Quality Care - Together  NEUROSURGERY HISTORY & PHYSICAL   Daniel Hampton is an 80 y.o. male.   Chief Complaint: Bilateral, left greater than right L5 radiculopathy HPI: This is a pleasant 80 year old male with a history of L4-5 stenosis with synovial cyst on the left.  He has been dealing with on and off L5 radiculopathy bilaterally that worsened after a [place] the [place].  He has now got chronic numbness going down both of his legs that irritates him daily and causes him difficulty with walking.  He has slight weakness in his lower extremities.  He denies any bowel or bladder changes.  Given his worsening symptoms, we discussed steroid injection versus surgical decompression.  We discussed all risk, benefits and expected outcomes and he agreed with surgical decompression.  He presents today for this.  No change in symptomatology.  Past Medical History:  Diagnosis Date   Allergy    SEASONAL   Arthritis    Asthma    COLONIC POLYPS, HX OF 03/12/2007   [place] DISEASE, LUMBAR 12/03/2008   DIVERTICULOSIS, COLON 10/17/2009   History of kidney stones    Hydrocele    HYPERLIPIDEMIA 03/12/2007   Melanoma ([place]) 04/23/2020   in situ-Left Upper Arm   MORTON'S NEUROMA, RIGHT 03/13/2008   SCC (squamous cell carcinoma) 04/26/2013   right hand-txpbx   SKIN CANCER, HX OF 10/17/2009   VERTIGO, BENIGN PAROXYSMAL POSITION 04/28/2007    Past Surgical History:  Procedure Laterality Date   BACK SURGERY     COLONOSCOPY W/ BIOPSIES     EYE SURGERY Left    MOHS SURGERY     SPINE SURGERY     lumbar disk surgery    Family History  Problem Relation Age of Onset   Lung cancer Mother    Ovarian cancer Mother    COPD Father    Heart attack Father    Alcohol abuse Brother    Lung cancer Brother        smoker   Breast cancer Daughter    Breast cancer Daughter    Colon cancer Neg Hx    Esophageal cancer Neg Hx    Rectal cancer Neg Hx    Stomach cancer Neg Hx     Social History:  reports that he quit smoking about 42 years ago. His smoking use included cigarettes. He has a 20.00 pack-year smoking history. He has never used smokeless tobacco. He reports current alcohol use. He reports that he does not use drugs.  Allergies: No Known Allergies  Medications Prior to Admission  Medication Sig Dispense Refill   aspirin 81 MG tablet Take 81 mg by mouth at bedtime.     Coenzyme Q10 (COQ10) 100 MG CAPS Take 100 mg by mouth daily.     Doxylamine Succinate, Sleep, (SLEEP AID PO) Take 1 capsule by mouth at bedtime as needed (sleep).     fexofenadine (ALLEGRA) 180 MG tablet Take 180 mg by mouth daily as needed for allergies or rhinitis.     fluticasone (FLONASE) 50 MCG/ACT nasal spray Place 2 sprays into both nostrils daily as needed for allergies or rhinitis.     ibuprofen (ADVIL) 200 MG tablet Take 400-600 mg by mouth daily as needed for moderate pain.     Multiple Vitamins-Minerals (MENS MULTI VITAMIN & MINERAL PO) Take 1 tablet by mouth daily.     Omega-3 Fatty Acids (FISH OIL) 1000 MG CAPS 2 caps twice daily (Patient taking differently: Take  2,000 mg by mouth daily.)  0   simvastatin (ZOCOR) 40 MG tablet TAKE ONE TABLET BY MOUTH ONE TIME DAILY (Patient taking differently: Take 40 mg by mouth at bedtime.) 90 tablet 0   tiZANidine (ZANAFLEX) 2 MG tablet Take 2 mg by mouth at bedtime as needed for muscle spasms.     Turmeric (QC TUMERIC COMPLEX) 500 MG CAPS Taking daily (Patient taking differently: Take 500 mg by mouth 2 (two) times a week.)     co-enzyme Q-10 50 MG capsule Take 1 capsule (50 mg total) by mouth daily. (Patient not taking: No sig reported)      No results found for this or any previous visit (from the past 48 hour(s)). No results found.  ROS All positives and negatives listed HPI above  Blood pressure (!) 153/77, pulse 63, temperature 98.3 F (36.8 C), temperature source Oral, resp. rate 18, height '5\' 10"'$  (1.778 m), weight 104.3 kg, SpO2  96 %. Physical Exam  Awake alert oriented x3, no acute distress PERRLA EOMI Cranial nerves II through XII intact Bilateral upper extremity 5/5 Bilateral lower extremity 5/5 except for dorsiflexion and knee extension 4+/5 Bilateral left greater than right decreased sensation in the L5 dermatome  Assessment/Plan  80 year old male with  L4-5 lumbar stenosis with synovial cyst causing left greater than right L5 radiculopathy -OR today for minimally invasive L 4-5 laminectomy, lateral recess decompression and resection of synovial cyst -All risks, benefits and expected outcomes discussed and agreed upon with the patient.   Thank you for allowing me to participate in this patient's care.  Please do not hesitate to call with questions or concerns.   Elwin Sleight, [facility] Cell: [phone]

## 2021-03-01 NOTE — Transfer of Care (Signed)
Immediate Anesthesia Transfer of Care Note  Patient: Daniel Hampton  Procedure(s) Performed: LEFT LUMBAR FOUR-FIVE MINIMALLY INVASIVE LAMINECTOMY, LATERAL RECESS DECOMPRESSION AND RESECTION OF SYNOVIAL CYST (Left: Spine Lumbar)  Patient Location: PACU  Anesthesia Type:General  Level of Consciousness: awake, alert , drowsy and patient cooperative  Airway & Oxygen Therapy: Patient Spontanous Breathing and Patient connected to face mask oxygen  Post-op Assessment: Report given to RN, Post -op Vital signs reviewed and stable and Patient moving all extremities X 4  Post vital signs: Reviewed and stable  Last Vitals:  Vitals Value Taken Time  BP 163/73 03/01/21 1442  Temp    Pulse 90 03/01/21 1443  Resp 14 03/01/21 1443  SpO2 95 % 03/01/21 1443  Vitals shown include unvalidated device data.  Last Pain:  Vitals:   03/01/21 1044  TempSrc:   PainSc: 2       Patients Stated Pain Goal: 1 ([phone] [id number])  Complications: No notable events documented.

## 2021-03-01 NOTE — Anesthesia Preprocedure Evaluation (Signed)
Anesthesia Evaluation  Patient identified by MRN, date of birth, ID band Patient awake    Reviewed: Allergy & Precautions, NPO status , Patient's Chart, lab work & pertinent test results  History of Anesthesia Complications Negative for: history of anesthetic complications  Airway Mallampati: IV  TM Distance: >3 FB Neck ROM: Full    Dental  (+) Dental Advisory Given, Teeth Intact   Pulmonary neg shortness of breath, neg sleep apnea, neg COPD, neg recent URI, former smoker,    breath sounds clear to auscultation       Cardiovascular negative cardio ROS   Rhythm:Regular     Neuro/Psych neg Seizures  Neuromuscular disease negative psych ROS   GI/Hepatic negative GI ROS, Neg liver ROS,   Endo/Other  negative endocrine ROS  Renal/GU negative Renal ROS     Musculoskeletal  (+) Arthritis ,   Abdominal   Peds  Hematology negative hematology ROS (+)   Anesthesia Other Findings   Reproductive/Obstetrics                             Anesthesia Physical Anesthesia Plan  ASA: 2  Anesthesia Plan: General   Post-op Pain Management:    Induction: Intravenous  PONV Risk Score and Plan: 2 and Ondansetron and Dexamethasone  Airway Management Planned: Oral ETT  Additional Equipment: None  Intra-op Plan:   Post-operative Plan: Extubation in OR  Informed Consent: I have reviewed the patients History and Physical, chart, labs and discussed the procedure including the risks, benefits and alternatives for the proposed anesthesia with the patient or authorized representative who has indicated his/her understanding and acceptance.     Dental advisory given  Plan Discussed with: CRNA and Surgeon  Anesthesia Plan Comments:         Anesthesia Quick Evaluation

## 2021-03-01 NOTE — Progress Notes (Addendum)
   Providing Compassionate, Quality Care - Together  NEUROSURGERY PROGRESS NOTE   S: s/e in pacu  O: EXAM:  BP (!) 153/77   Pulse 63   Temp 98.7 F (37.1 C)   Resp 18   Ht '5\' 10"'$  (1.778 m)   Wt 104.3 kg   SpO2 96%   BMI 33.00 kg/m   Awake, alert, oriented  PERRL Speech fluent, appropriate  CNs grossly intact  MAE equally 4+/5 BLE Dressing c/d/I SILT  ASSESSMENT:  80 y.o. male with   L4-5 stenosis with synovial cyst with BLE L>R L5 radiculopathy  S/p L4-5 MIS laminectomy and resection of cyst on 03/01/2021  PLAN: - pain control - dc home - family updated - meds sent    Thank you for allowing me to participate in this patient's care.  Please do not hesitate to call with questions or concerns.   Elwin Sleight, [facility] Cell: [phone]

## 2021-03-02 NOTE — Anesthesia Postprocedure Evaluation (Signed)
Anesthesia Post Note  Patient: Daniel Hampton  Procedure(s) Performed: LEFT LUMBAR FOUR-FIVE MINIMALLY INVASIVE LAMINECTOMY, LATERAL RECESS DECOMPRESSION AND RESECTION OF SYNOVIAL CYST (Left: Spine Lumbar)     Anesthesia Post Evaluation No notable events documented.  Last Vitals:  Vitals:   03/01/21 1458 03/01/21 1512  BP: 137/69 (!) 142/71  Pulse: 84 78  Resp: 11 11  Temp:  36.8 C  SpO2: 92% 94%    Last Pain:  Vitals:   03/01/21 1512  TempSrc:   PainSc: 2                  Lulla Linville

## 2021-03-03 ENCOUNTER — Encounter (HOSPITAL_COMMUNITY): Payer: Self-pay | Admitting: Neurological Surgery

## 2021-03-03 NOTE — Op Note (Signed)
Providing Compassionate, Quality Care - Together Date of service: 03/01/2021  PREOP DIAGNOSIS:  Bilateral left greater than right L5 radiculopathy due to L4-5 lumbar stenosis with left L4-5 synovial cyst  POSTOP DIAGNOSIS: Same  PROCEDURE: Minimally invasive bilateral L4-L5 laminectomy, bilateral partial medial facetectomies for resection of synovial cyst Intraoperative use of microscope for microdissection Intraoperative use of fluoroscopy, less than 1 hour  SURGEON: Dr. Pieter Partridge C. Tamir Wallman, DO  ASSISTANT: Weston Brass, NP  ANESTHESIA: General Endotracheal  EBL: 50 cc  SPECIMENS: None  DRAINS: None  COMPLICATIONS: None  CONDITION: Hemodynamically stable  HISTORY: Daniel Hampton is a 80 y.o. male that presented to my office with bilateral left greater than right L5 radiculopathy.  We attempted conservative measures including home therapies and pain control however his numbness in his left leg continued to progress and become more frequent and has become more apparent in his right leg as well.  He then had a fall while playing golf and this exacerbated his bilateral L5 radiculopathy.  Given this altering his lifestyle, we discussed steroid injections versus surgical decompression.  I recommended surgical decompression given the severe stenosis and synovial cyst at L4-5.  We discussed all risks, benefits and expected outcomes and he agreed to proceed.  PROCEDURE IN DETAIL: The patient was brought to the operating room. After induction of general anesthesia, the patient was positioned on the operative table in the prone position. All pressure points were meticulously padded. Skin incision was then marked out and prepped and draped in the usual sterile fashion.  A physician driven timeout was performed.  Using lateral fluoroscopy, the L4-5 space was identified.  Using a 10 blade, a midline incision was performed through the dermis.  Using the metrx dilators, the left L4 lamina, L4-5 facet  were docked and a 20 mm tapered tube was placed.  This was confirmed at the L4-5 space using lateral fluoroscopy.  The microscope was then draped sterilely and brought into the field and used for the remainder of the surgery.  Using Bovie cautery, the soft tissue was cleared from the L4 lamina and medial facet on the left.  Using a high-speed drill a bilateral laminectomy at L4-5 was completed down to the ligamentum flavum.  This was continued laterally to perform partial medial facetectomies bilaterally using the high-speed drill.  The ligamentous attachment was identified at midline superiorly.  Carefully using a series of micro curettes and micro nerve hooks, the ligamentum flavum was carefully dissected from the thecal sac.  Using Kerrison rongeurs, the ligamentum flavum was resected bilaterally to the lateral recess.  On the left within the ligamentum flavum, there was a synovial cyst that was encountered.  This was carefully dissected off the thecal sac which was quite adherent.  I was able to create a plane using microcurette's.  The bilateral L5 nerve roots were identified and tracked laterally and inferiorly.  Kerrison rongeurs were used to resect ligamentum flavum and the medial facet.  Using a micro nerve hook, the L5 nerve roots were followed inferiorly and laterally and noted to be adequately decompressed.  The thecal sac was pulsatile.  Epidural hemostasis was achieved with Surgiflo.  The wound was copiously irrigated and noted to be hemostatic.  A small piece of Gelfoam with Depo-Medrol was placed over the thecal sac.  The metrx dilator tube was gently removed while obtaining hemostasis in the muscle and soft tissue with bipolar cautery.  The wound was then closed with 2-0 Vicryl sutures.  The skin  was closed with Dermabond.  Sterile dressing was applied.  At the end of the case all sponge, needle, and instrument counts were correct. The patient was then transferred to the stretcher, extubated, and  taken to the post-anesthesia care unit in stable hemodynamic condition.

## 2021-03-04 ENCOUNTER — Encounter: Payer: Self-pay | Admitting: Internal Medicine

## 2021-03-04 ENCOUNTER — Encounter (HOSPITAL_COMMUNITY): Payer: Self-pay | Admitting: Neurological Surgery

## 2021-03-14 DIAGNOSIS — R972 Elevated prostate specific antigen [PSA]: Secondary | ICD-10-CM | POA: Diagnosis not present

## 2021-03-21 DIAGNOSIS — N281 Cyst of kidney, acquired: Secondary | ICD-10-CM | POA: Diagnosis not present

## 2021-03-21 DIAGNOSIS — N401 Enlarged prostate with lower urinary tract symptoms: Secondary | ICD-10-CM | POA: Diagnosis not present

## 2021-03-21 DIAGNOSIS — R972 Elevated prostate specific antigen [PSA]: Secondary | ICD-10-CM | POA: Diagnosis not present

## 2021-03-21 DIAGNOSIS — R350 Frequency of micturition: Secondary | ICD-10-CM | POA: Diagnosis not present

## 2021-04-15 ENCOUNTER — Ambulatory Visit
Admission: RE | Admit: 2021-04-15 | Discharge: 2021-04-15 | Disposition: A | Discharge status: Home / Self Care | Payer: Medicare Other | Source: Ambulatory Visit | Attending: Allergy and Immunology | Admitting: Allergy and Immunology

## 2021-04-15 ENCOUNTER — Other Ambulatory Visit: Payer: Self-pay | Admitting: Allergy and Immunology

## 2021-04-15 DIAGNOSIS — J301 Allergic rhinitis due to pollen: Secondary | ICD-10-CM | POA: Diagnosis not present

## 2021-04-15 DIAGNOSIS — H1045 Other chronic allergic conjunctivitis: Secondary | ICD-10-CM | POA: Diagnosis not present

## 2021-04-15 DIAGNOSIS — J3089 Other allergic rhinitis: Secondary | ICD-10-CM | POA: Diagnosis not present

## 2021-04-15 DIAGNOSIS — R059 Cough, unspecified: Secondary | ICD-10-CM

## 2021-05-15 ENCOUNTER — Other Ambulatory Visit: Payer: Self-pay | Admitting: Internal Medicine

## 2021-05-16 ENCOUNTER — Other Ambulatory Visit: Payer: Self-pay

## 2021-05-16 ENCOUNTER — Encounter: Payer: Self-pay | Admitting: Internal Medicine

## 2021-05-16 ENCOUNTER — Ambulatory Visit: Payer: Medicare Other | Admitting: Internal Medicine

## 2021-05-16 VITALS — BP 128/68 | HR 66 | Temp 98.1°F | Ht 70.0 in | Wt 244.4 lb

## 2021-05-16 DIAGNOSIS — Z87891 Personal history of nicotine dependence: Secondary | ICD-10-CM | POA: Diagnosis not present

## 2021-05-16 DIAGNOSIS — R0609 Other forms of dyspnea: Secondary | ICD-10-CM

## 2021-05-16 DIAGNOSIS — R053 Chronic cough: Secondary | ICD-10-CM

## 2021-05-16 DIAGNOSIS — Z825 Family history of asthma and other chronic lower respiratory diseases: Secondary | ICD-10-CM | POA: Diagnosis not present

## 2021-05-16 NOTE — Patient Instructions (Signed)
ICD-10-CM   1. Family history of COPD (chronic obstructive pulmonary disease)  Z82.5     2. Stopped smoking with greater than 20 pack year history  Z87.891     3. DOE (dyspnea on exertion)  R06.09     4. Chronic cough  R05.3       Need to rule out lung disease such as copd or pulmonary fibrosis to explain your symptoms  Plan  - get full PFT  -get HRCT supine and prone - get alpha 1 AT genetic test  Followup  - return to see Dr Chase Caller or aPP to discuss results - video or face to face or telephone and next steps

## 2021-05-16 NOTE — Progress Notes (Signed)
OV 05/16/2021  Subjective:  Patient ID: Daniel Hampton, male , DOB: 10-Feb-1941 , age 80 y.o. , MRN: [id number] , ADDRESS: [place] [id number] PCP [name], MD Patient Care Team: [name], MD as PCP - General (Internal Medicine) Gerda Diss, DO as Consulting Physician (Sports Medicine) Festus Aloe, MD as Consulting Physician (Urology) Harold Hedge, Darrick Grinder, MD as Consulting Physician (Allergy and Immunology)  This Provider for this visit: Treatment Team:  Attending Provider: Brand Males, MD    05/16/2021 -   Chief Complaint  Patient presents with   Consult    Pt had a cxr performed which is part of the reason for the visit. States that he has a chronic cough which he says is worse in the afternoons and at night.     HPI Daniel Hampton 80 y.o. - new consult evaluation for abnormal cxr.  He used to work in [employer].  His father died from COPD.  He has a first cousin also died from COPD.  He smoked 20 pack smoking history but quit 40 years ago.  The 1980s he had a back surgery but recently has been having for neuropathy.  He then saw Dr. Narda Amber.  Then he had back surgery end of July 2022.  Somewhere along the way he had a chest x-ray 04/15/2021.  Shows emphysematous changes.  Therefore he is concerned about this and came to see me.  Personally visualized the film.  He tells me that at baseline he used to walk 5 miles a day but recently only walking 2 miles a day nonstop.  He is limited by his neuropathy.  There is only some limitation with shortness of breath but he does have some shortness of breath.  More importantly his chronic cough for the last 5 years.  He says everyone who knows him knows that he carries mints with him.  Recently allergist given Pepcid a month ago and this helped control the cough.  The cough is always improved by a minute and recently by Pepcid its a dry cough.  Symptoms are significantly in the daytime  but some at night.  No shortness of breath of significant proportion with the cough but just mild with heavy exertion.  No wheezing at all.  No orthopnea proximal nocturnal dyspnea.  He does have significant visceral obesity.  Recent lab work shown below.      Chemistry 02/26/21  Results for Daniel Hampton, Daniel "Balthazar Rise" (MRN [id number]) as of 05/16/2021 09:44  Ref. Range 02/26/2021 08:53  Creatinine Latest Ref Range: 0.61 - 1.24 mg/dL 0.90  Results for Daniel Hampton, TRUDELL" (MRN [id number]) as of 05/16/2021 09:44  Ref. Range 02/26/2021 08:53  Hemoglobin Latest Ref Range: 13.0 - 17.0 g/dL 15.5   Narrative & Impression  CLINICAL DATA:  81 year old male with cough   EXAM: CHEST - 2 VIEW   COMPARISON:  09/29/2019   FINDINGS: Cardiomediastinal silhouette unchanged in size and contour. No evidence of central vascular congestion. No interlobular septal thickening.   Stigmata of emphysema, with increased retrosternal airspace, flattened hemidiaphragms, increased AP diameter, and hyperinflation on the AP view.   Coarsened interstitial markings with no confluent airspace disease.   No pneumothorax or pleural effusion.   No acute displaced fracture. Degenerative changes of the spine.   IMPRESSION: Emphysematous changes with no acute cardiopulmonary disease     Electronically Signed   By: Corrie Mckusick D.O.  On: 04/15/2021 14:33      has a past medical history of Allergy, Arthritis, Asthma, COLONIC POLYPS, HX OF (03/12/2007), DISC DISEASE, LUMBAR (12/03/2008), DIVERTICULOSIS, COLON (10/17/2009), History of kidney stones, Hydrocele, HYPERLIPIDEMIA (03/12/2007), Melanoma ([facility]) (04/23/2020), MORTON'S NEUROMA, RIGHT (03/13/2008), SCC (squamous cell carcinoma) (04/26/2013), SKIN CANCER, HX OF (10/17/2009), and VERTIGO, BENIGN PAROXYSMAL POSITION (04/28/2007).   reports that he quit smoking about 42 years ago. His smoking use included cigarettes. He has a 20.00 pack-year smoking  history. He has never used smokeless tobacco.  Past Surgical History:  Procedure Laterality Date   BACK SURGERY     COLONOSCOPY W/ BIOPSIES     EYE SURGERY Left    LUMBAR LAMINECTOMY/ DECOMPRESSION WITH MET-RX Left 03/01/2021   Procedure: LEFT LUMBAR FOUR-FIVE MINIMALLY INVASIVE LAMINECTOMY, LATERAL RECESS DECOMPRESSION AND RESECTION OF SYNOVIAL CYST;  Surgeon: Karsten Ro, DO;  Location: [facility];  Service: Neurosurgery;  Laterality: Left;   MOHS SURGERY     SPINE SURGERY     lumbar disk surgery    No Known Allergies  Immunization History  Administered Date(s) Administered   Fluad Quad(high Dose 65+) 05/10/2019   Influenza Split 04/14/2012, 05/31/2013   Influenza Whole 08/04/2005, 07/19/2007   Influenza, High Dose Seasonal PF 06/05/2015, 07/01/2016, 05/12/2018, 05/23/2020   Influenza,inj,Quad PF,6+ Mos 06/06/2017   Influenza-Unspecified 05/04/2014, 06/05/2015   PFIZER(Purple Top)SARS-COV-2 Vaccination 08/26/2019, 09/13/2019, 06/05/2020   Pneumococcal Conjugate-13 08/08/2014   Pneumococcal Polysaccharide-23 08/04/2004, 12/31/2010, 09/29/2019   Td 08/04/2006   Zoster, Live 04/04/2012    Family History  Problem Relation Age of Onset   Lung cancer Mother    Ovarian cancer Mother    COPD Father    Heart attack Father    Alcohol abuse Brother    Lung cancer Brother        smoker   Breast cancer Daughter    Breast cancer Daughter    Colon cancer Neg Hx    Esophageal cancer Neg Hx    Rectal cancer Neg Hx    Stomach cancer Neg Hx      Current Outpatient Medications:    Coenzyme Q10 (COQ-10 PO), Take by mouth., Disp: , Rfl:    Doxylamine Succinate, Sleep, (SLEEP AID PO), Take 1 capsule by mouth at bedtime as needed (sleep)., Disp: , Rfl:    fexofenadine (ALLEGRA) 180 MG tablet, Take 180 mg by mouth daily as needed for allergies or rhinitis., Disp: , Rfl:    fluticasone (FLONASE) 50 MCG/ACT nasal spray, Place 2 sprays into both nostrils daily as needed for allergies or  rhinitis., Disp: , Rfl:    Multiple Vitamins-Minerals (ALIVE MULTI-VITAMIN PO), Take by mouth., Disp: , Rfl:    simvastatin (ZOCOR) 40 MG tablet, TAKE ONE TABLET BY MOUTH ONE TIME DAILY, Disp: 90 tablet, Rfl: 0      Objective:   Vitals:   05/16/21 0938  BP: 128/68  Pulse: 66  Temp: 98.1 F (36.7 C)  TempSrc: Oral  SpO2: 97%  Weight: 244 lb 6.4 oz (110.9 kg)  Height: '5\' 10"'  (1.778 m)    Estimated body mass index is 35.07 kg/m as calculated from the following:   Height as of this encounter: '5\' 10"'  (1.778 m).   Weight as of this encounter: 244 lb 6.4 oz (110.9 kg).  '@WEIGHTCHANGE' @  [employer]   05/16/21 0938  Weight: 244 lb 6.4 oz (110.9 kg)     Physical Exam  General Appearance:    Alert, cooperative, no distress, appears stated age -younger , Deconditioned  looking - no , OBESE  - yes, Sitting on Wheelchair -  no  Head:    Normocephalic, without obvious abnormality, atraumatic  Eyes:    PERRL, conjunctiva/corneas clear,  Ears:    Normal TM's and external ear canals, both ears  Nose:   Nares normal, septum midline, mucosa normal, no drainage    or sinus tenderness. OXYGEN ON  - no . Patient is @ ra   Throat:   Lips, mucosa, and tongue normal; teeth and gums normal. Cyanosis on lips - no  Neck:   Supple, symmetrical, trachea midline, no adenopathy;    thyroid:  no enlargement/tenderness/nodules; no carotid   bruit or JVD  Back:     Symmetric, no curvature, ROM normal, no CVA tenderness  Lungs:     Distress - no , Wheeze no, Barrell Chest - no, Purse lip breathing - no, Crackles - no   Chest Wall:    No tenderness or deformity.    Heart:    Regular rate and rhythm, S1 and S2 normal, no rub   or gallop, Murmur - no  Breast Exam:    NOT DONE  Abdomen:     Soft, non-tender, bowel sounds active all four quadrants,    no masses, no organomegaly. Visceral obesity - yes  Genitalia:   NOT DONE  Rectal:   NOT DONE  Extremities:   Extremities - normal, Has Cane - no,  Clubbing - no, Edema - no  Pulses:   2+ and symmetric all extremities  Skin:   Stigmata of Connective Tissue Disease - no  Lymph nodes:   Cervical, supraclavicular, and axillary nodes normal  Psychiatric:  Neurologic:   Pleasant - yes, Anxious - no, Flat affect - no  CAm-ICU - neg, Alert and Oriented x 3 - yes, Moves all 4s - yes, Speech - normal, Cognition - intact         Assessment:       ICD-10-CM   1. Family history of COPD (chronic obstructive pulmonary disease)  Z82.5     2. Stopped smoking with greater than 20 pack year history  Z87.891     3. DOE (dyspnea on exertion)  R06.09     4. Chronic cough  R05.3          Plan:     Patient Instructions     ICD-10-CM   1. Family history of COPD (chronic obstructive pulmonary disease)  Z82.5     2. Stopped smoking with greater than 20 pack year history  Z87.891     3. DOE (dyspnea on exertion)  R06.09     4. Chronic cough  R05.3       Need to rule out lung disease such as copd or pulmonary fibrosis to explain your symptoms  Plan  - get full PFT  -get HRCT supine and prone - get alpha 1 AT genetic test  Followup  - return to see Dr Chase Caller or aPP to discuss results - video or face to face or telephone and next steps    SIGNATURE    Dr. Brand Males, M.D., F.C.C.P,  Pulmonary and Critical Care Medicine Staff Physician, [place] Director - Interstitial Lung Disease  Program  Pulmonary [place] [place], [place], [address]  Pager: [phone], If no answer or between  15:00h - 7:00h: call [phone] Telephone: [phone]  10:10 AM 05/16/2021

## 2021-05-16 NOTE — Addendum Note (Signed)
Addended by: Lorretta Harp on: 05/16/2021 10:14 AM   Modules accepted: Orders

## 2021-05-16 NOTE — Addendum Note (Signed)
Addended by: Suzzanne Cloud E on: 05/16/2021 10:16 AM   Modules accepted: Orders

## 2021-05-23 ENCOUNTER — Ambulatory Visit (INDEPENDENT_AMBULATORY_CARE_PROVIDER_SITE_OTHER)
Admission: RE | Admit: 2021-05-23 | Discharge: 2021-05-23 | Disposition: A | Discharge status: Home / Self Care | Payer: Medicare Other | Source: Ambulatory Visit | Attending: Internal Medicine | Admitting: Internal Medicine

## 2021-05-23 ENCOUNTER — Other Ambulatory Visit: Payer: Self-pay

## 2021-05-23 DIAGNOSIS — R918 Other nonspecific abnormal finding of lung field: Secondary | ICD-10-CM | POA: Diagnosis not present

## 2021-05-23 DIAGNOSIS — R0600 Dyspnea, unspecified: Secondary | ICD-10-CM | POA: Diagnosis not present

## 2021-05-23 DIAGNOSIS — J439 Emphysema, unspecified: Secondary | ICD-10-CM | POA: Diagnosis not present

## 2021-05-23 DIAGNOSIS — Z825 Family history of asthma and other chronic lower respiratory diseases: Secondary | ICD-10-CM

## 2021-05-23 DIAGNOSIS — R911 Solitary pulmonary nodule: Secondary | ICD-10-CM | POA: Diagnosis not present

## 2021-05-25 LAB — ALPHA-1 ANTITRYPSIN PHENOTYPE: A-1 Antitrypsin, Ser: 126 mg/dL (ref 83–199)

## 2021-06-07 ENCOUNTER — Telehealth: Payer: Self-pay | Admitting: Internal Medicine

## 2021-06-07 NOTE — Telephone Encounter (Signed)
Left message for patient to call me back at [phone] to schedule Medicare Annual Wellness Visit   Last AWV  03/05/20  Please schedule at anytime with [facility] if patient calls the office back.    40 Minutes appointment   Any questions, please call me at [phone]

## 2021-06-12 DIAGNOSIS — M5416 Radiculopathy, lumbar region: Secondary | ICD-10-CM | POA: Diagnosis not present

## 2021-06-24 DIAGNOSIS — M47816 Spondylosis without myelopathy or radiculopathy, lumbar region: Secondary | ICD-10-CM | POA: Diagnosis not present

## 2021-06-24 DIAGNOSIS — M48061 Spinal stenosis, lumbar region without neurogenic claudication: Secondary | ICD-10-CM | POA: Diagnosis not present

## 2021-06-24 DIAGNOSIS — Z9889 Other specified postprocedural states: Secondary | ICD-10-CM | POA: Diagnosis not present

## 2021-07-02 ENCOUNTER — Encounter: Payer: Self-pay | Admitting: Adult Health

## 2021-07-02 ENCOUNTER — Ambulatory Visit (INDEPENDENT_AMBULATORY_CARE_PROVIDER_SITE_OTHER): Payer: Medicare Other | Admitting: Internal Medicine

## 2021-07-02 ENCOUNTER — Other Ambulatory Visit: Payer: Self-pay

## 2021-07-02 ENCOUNTER — Ambulatory Visit (INDEPENDENT_AMBULATORY_CARE_PROVIDER_SITE_OTHER): Payer: Medicare Other | Admitting: Adult Health

## 2021-07-02 VITALS — BP 134/70 | HR 67 | Temp 98.2°F | Ht 70.0 in | Wt 236.0 lb

## 2021-07-02 DIAGNOSIS — J439 Emphysema, unspecified: Secondary | ICD-10-CM | POA: Insufficient documentation

## 2021-07-02 DIAGNOSIS — R911 Solitary pulmonary nodule: Secondary | ICD-10-CM | POA: Insufficient documentation

## 2021-07-02 DIAGNOSIS — R053 Chronic cough: Secondary | ICD-10-CM

## 2021-07-02 DIAGNOSIS — Z825 Family history of asthma and other chronic lower respiratory diseases: Secondary | ICD-10-CM

## 2021-07-02 DIAGNOSIS — R918 Other nonspecific abnormal finding of lung field: Secondary | ICD-10-CM | POA: Insufficient documentation

## 2021-07-02 LAB — PULMONARY FUNCTION TEST
DL/VA % pred: 116 %
DL/VA: 4.52 ml/min/mmHg/L
DLCO cor % pred: 117 %
DLCO cor: 28.53 ml/min/mmHg
DLCO unc % pred: 117 %
DLCO unc: 28.53 ml/min/mmHg
FEF 25-75 Post: 3.12 L/sec
FEF 25-75 Pre: 3.06 L/sec
FEF2575-%Change-Post: 2 %
FEF2575-%Pred-Post: 154 %
FEF2575-%Pred-Pre: 151 %
FEV1-%Change-Post: 5 %
FEV1-%Pred-Post: 117 %
FEV1-%Pred-Pre: 111 %
FEV1-Post: 3.1 L
FEV1-Pre: 2.95 L
FEV1FVC-%Change-Post: 8 %
FEV1FVC-%Pred-Pre: 104 %
FEV6-%Change-Post: -1 %
FEV6-%Pred-Post: 105 %
FEV6-%Pred-Pre: 106 %
FEV6-Post: 3.6 L
FEV6-Pre: 3.65 L
FEV6FVC-%Pred-Post: 106 %
FEV6FVC-%Pred-Pre: 106 %
FVC-%Change-Post: -2 %
FVC-%Pred-Post: 101 %
FVC-%Pred-Pre: 104 %
FVC-Post: 3.68 L
FVC-Pre: 3.8 L
Post FEV1/FVC ratio: 84 %
Post FEV6/FVC ratio: 100 %
Pre FEV1/FVC ratio: 78 %
Pre FEV6/FVC Ratio: 100 %
RV % pred: 142 %
RV: 3.8 L
TLC % pred: 122 %
TLC: 8.65 L

## 2021-07-02 MED ORDER — ALBUTEROL SULFATE HFA 108 (90 BASE) MCG/ACT IN AERS: 1.0000 | INHALATION_SPRAY | Freq: Four times a day (QID) | RESPIRATORY_TRACT | 2 refills | Status: DC | PRN

## 2021-07-02 NOTE — Progress Notes (Signed)
@Patient  ID: Daniel Hampton, male    DOB: [birth year], 80 y.o.   MRN: [id number]  Chief Complaint  Patient presents with   Follow-up    Referring provider: [name], MD  HPI: 80 year old male former smoker seen for pulmonary consult May 16, 2021 for cough and abnormal CT chest  TEST/EVENTS :  HRCT chest 05/2021-No evidence of fibrotic interstitial lung disease. 2. Minimal emphysema. 3. Small pulmonary nodules measuring up to 0.4 cm. No follow-up needed if patient is low-risk (and has no known or suspected primary neoplasm). Non-contrast chest CT can be considered in 12 months if patient is high-risk.   07/02/2021 Follow up : Cough/Abnormal CT chest , PFT  Patient returns for 1 month follow-up.  Patient was seen last visit for pulmonary consult for chronic cough and abnormal CT chest.  Previous CT chest showed some emphysematous changes.  Patient has a smoking history 20-pack-year history.  Has a strong family history of COPD.  Patient was set up for high-resolution CT chest to evaluate his chronic cough is been present for the last 5 years. Cough is mainly dry in nature. Seen by allergist and started on Pepcid Twice daily . Cough is 95% better.  HRCT chest on May 26, 2021 showed no evidence of interstitial lung disease.  Minimal emphysema.  Some small scattered pulmonary nodules measuring up to 0.4 cm.  We discussed a follow-up CT chest in 1 year Alpha-1 antitrypsin level was normal with MM phenotype. PFTs done today show normal lung function with FEV1 at 117%, ratio 84, FVC 101%.  No significant bronchodilator response, DLCO 117%. He remains active, walks several times a week for 2 miles.   Under a lot of stress, daughter died recently from MI unexpectedly. Wife has ovarian cancer.     No Known Allergies  Immunization History  Administered Date(s) Administered   Fluad Quad(high Dose 65+) 05/10/2019, 06/01/2021   Influenza Split 04/14/2012, 05/31/2013   Influenza  Whole 08/04/2005, 07/19/2007   Influenza, High Dose Seasonal PF 06/05/2015, 07/01/2016, 05/12/2018, 05/23/2020   Influenza,inj,Quad PF,6+ Mos 06/06/2017   Influenza-Unspecified 05/04/2014, 06/05/2015   PFIZER(Purple Top)SARS-COV-2 Vaccination 08/26/2019, 09/13/2019, 06/05/2020   Pneumococcal Conjugate-13 08/08/2014   Pneumococcal Polysaccharide-23 08/04/2004, 12/31/2010, 09/29/2019   Td 08/04/2006   Zoster, Live 04/04/2012    Past Medical History:  Diagnosis Date   Allergy    SEASONAL   Arthritis    Asthma    COLONIC POLYPS, HX OF 03/12/2007   [place] DISEASE, LUMBAR 12/03/2008   DIVERTICULOSIS, COLON 10/17/2009   History of kidney stones    Hydrocele    HYPERLIPIDEMIA 03/12/2007   Melanoma ([facility]) 04/23/2020   in situ-Left Upper Arm   MORTON'S NEUROMA, RIGHT 03/13/2008   SCC (squamous cell carcinoma) 04/26/2013   right hand-txpbx   SKIN CANCER, HX OF 10/17/2009   VERTIGO, BENIGN PAROXYSMAL POSITION 04/28/2007    Tobacco History: Social History   Tobacco Use  Smoking Status Former   Packs/day: 1.00   Years: 20.00   Pack years: 20.00   Types: Cigarettes   Quit date: 10/30/1978   Years since quitting: 42.7  Smokeless Tobacco Never   Counseling given: Not Answered   Outpatient Medications Prior to Visit  Medication Sig Dispense Refill   Coenzyme Q10 (COQ-10 PO) Take by mouth.     Doxylamine Succinate, Sleep, (SLEEP AID PO) Take 1 capsule by mouth at bedtime as needed (sleep).     fexofenadine (ALLEGRA) 180 MG tablet Take 180 mg by mouth daily as  needed for allergies or rhinitis.     fluticasone (FLONASE) 50 MCG/ACT nasal spray Place 2 sprays into both nostrils daily as needed for allergies or rhinitis.     Multiple Vitamins-Minerals (ALIVE MULTI-VITAMIN PO) Take by mouth.     simvastatin (ZOCOR) 40 MG tablet TAKE ONE TABLET BY MOUTH ONE TIME DAILY 90 tablet 0   No facility-administered medications prior to visit.     Review of Systems:   Constitutional:   No   weight loss, night sweats,  Fevers, chills,  +fatigue, or  lassitude.  HEENT:   No headaches,  Difficulty swallowing,  Tooth/dental problems, or  Sore throat,                No sneezing, itching, ear ache, nasal congestion, post nasal drip,   CV:  No chest pain,  Orthopnea, PND, swelling in lower extremities, anasarca, dizziness, palpitations, syncope.   GI  No heartburn, indigestion, abdominal pain, nausea, vomiting, diarrhea, change in bowel habits, loss of appetite, bloody stools.   Resp: No shortness of breath with exertion or at rest.  No excess mucus, no productive cough,  ,  No coughing up of blood.  No change in color of mucus.  No wheezing.  No chest wall deformity  Skin: no rash or lesions.  GU: no dysuria, change in color of urine, no urgency or frequency.  No flank pain, no hematuria   MS:  No joint pain or swelling.  No decreased range of motion.  + back pain.    Physical Exam  BP 134/70 (BP Location: Left Arm, Patient Position: Sitting, Cuff Size: Normal)   Pulse 67   Temp 98.2 F (36.8 C) (Oral)   Ht 5\' 10"  (1.778 m)   Wt 236 lb (107 kg)   SpO2 97%   BMI 33.86 kg/m   GEN: A/Ox3; pleasant , NAD, well nourished    HEENT:  [place]/AT,  EACs-clear, TMs-wnl, NOSE-clear, THROAT-clear, no lesions, no postnasal drip or exudate noted.   NECK:  Supple w/ fair ROM; no JVD; normal carotid impulses w/o bruits; no thyromegaly or nodules palpated; no lymphadenopathy.    RESP  Clear  P & A; w/o, wheezes/ rales/ or rhonchi. no accessory muscle use, no dullness to percussion  CARD:  RRR, no m/r/g, no peripheral edema, pulses intact, no cyanosis or clubbing.  GI:   Soft & nt; nml bowel sounds; no organomegaly or masses detected.   Musco: Warm bil, no deformities or joint swelling noted.   Neuro: alert, no focal deficits noted.    Skin: Warm, no lesions or rashes    Lab Results:  CBC    Component Value Date/Time   WBC 7.3 02/26/2021 0853   RBC 4.89 02/26/2021 0853    HGB 15.5 02/26/2021 0853   HCT 45.2 02/26/2021 0853   PLT 205 02/26/2021 0853   MCV 92.4 02/26/2021 0853   MCH 31.7 02/26/2021 0853   MCHC 34.3 02/26/2021 0853   RDW 12.0 02/26/2021 0853   LYMPHSABS 3.0 01/18/2020 1108   MONOABS 0.6 01/18/2020 1108   EOSABS 0.1 01/18/2020 1108   BASOSABS 0.1 01/18/2020 1108    BMET    Component Value Date/Time   NA 140 02/26/2021 0853   K 4.5 02/26/2021 0853   CL 105 02/26/2021 0853   CO2 25 02/26/2021 0853   GLUCOSE 118 (H) 02/26/2021 0853   GLUCOSE 95 07/24/2006 1148   BUN 16 02/26/2021 0853   CREATININE 0.90 02/26/2021 0853   CALCIUM 9.1  02/26/2021 0853   GFRNONAA >60 02/26/2021 0853   GFRAA 108 09/18/2008 0000    BNP No results found for: BNP  ProBNP No results found for: PROBNP  Imaging: No results found.    PFT Results Latest Ref Rng & Units 07/02/2021  FVC-Pre L 3.80  FVC-Predicted Pre % 104  FVC-Post L 3.68  FVC-Predicted Post % 101  Pre FEV1/FVC % % 78  Post FEV1/FCV % % 84  FEV1-Pre L 2.95  FEV1-Predicted Pre % 111  FEV1-Post L 3.10  DLCO uncorrected ml/min/mmHg 28.53  DLCO UNC% % 117  DLCO corrected ml/min/mmHg 28.53  DLCO COR %Predicted % 117  DLVA Predicted % 116  TLC L 8.65  TLC % Predicted % 122  RV % Predicted % 142    No results found for: NITRICOXIDE      Assessment & Plan:   Emphysema lung (HCC) Mild emphysema.  Pulmonary function testing is normal with no airflow obstruction or restriction.  Patient has minimum symptoms.  Cough is improved with GERD management.  Patient appears to be active with minimum symptom burden.  We will continue with activities.  Alpha-1 testing was normal.  Plan  Patient Instructions  CT chest in 1 year to follow lung nodules  Continue on Pepcid 20mg  Twice daily.  Delsym 2 tsp Twice daily  As needed cough.  Albuterol inhaler 1-2 puffs every 6hr as needed.  Follow up with Dr. Chase Caller in 6 months and As needed          Lung nodules CT chest shows small  scattered lung nodules maximum size 0.4 cm.  Recommend CT chest without contrast in 1 year.  Chronic cough Chronic a cough seems to be improved with GERD management.  Patient's continue on current regimen.  May use albuterol, as needed and Delsym as needed. Continue on Pepcid twice daily  Plan  Patient Instructions  CT chest in 1 year to follow lung nodules  Continue on Pepcid 20mg  Twice daily.  Delsym 2 tsp Twice daily  As needed cough.  Albuterol inhaler 1-2 puffs every 6hr as needed.  Follow up with Dr. Chase Caller in 6 months and As needed            Rexene Edison, NP 07/02/2021

## 2021-07-02 NOTE — Patient Instructions (Signed)
Full PFT performed today. °

## 2021-07-02 NOTE — Assessment & Plan Note (Signed)
Chronic a cough seems to be improved with GERD management.  Patient's continue on current regimen.  May use albuterol, as needed and Delsym as needed. Continue on Pepcid twice daily  Plan  Patient Instructions  CT chest in 1 year to follow lung nodules  Continue on Pepcid 20mg  Twice daily.  Delsym 2 tsp Twice daily  As needed cough.  Albuterol inhaler 1-2 puffs every 6hr as needed.  Follow up with Dr. Chase Caller in 6 months and As needed

## 2021-07-02 NOTE — Patient Instructions (Addendum)
CT chest in 1 year to follow lung nodules  Continue on Pepcid 20mg  Twice daily.  Delsym 2 tsp Twice daily  As needed cough.  Albuterol inhaler 1-2 puffs every 6hr as needed.  Follow up with Dr. Chase Caller in 6 months and As needed

## 2021-07-02 NOTE — Assessment & Plan Note (Signed)
Mild emphysema.  Pulmonary function testing is normal with no airflow obstruction or restriction.  Patient has minimum symptoms.  Cough is improved with GERD management.  Patient appears to be active with minimum symptom burden.  We will continue with activities.  Alpha-1 testing was normal.  Plan  Patient Instructions  CT chest in 1 year to follow lung nodules  Continue on Pepcid 20mg  Twice daily.  Delsym 2 tsp Twice daily  As needed cough.  Albuterol inhaler 1-2 puffs every 6hr as needed.  Follow up with Dr. Chase Caller in 6 months and As needed

## 2021-07-02 NOTE — Progress Notes (Signed)
Full PFT performed today. °

## 2021-07-02 NOTE — Assessment & Plan Note (Signed)
CT chest shows small scattered lung nodules maximum size 0.4 cm.  Recommend CT chest without contrast in 1 year.

## 2021-07-07 NOTE — Progress Notes (Signed)
Subjective:    Patient ID: Daniel Hampton, male    DOB: October 03, 1940, 80 y.o.   MRN: [id number]  This visit occurred during the SARS-CoV-2 public health emergency.  Safety protocols were in place, including screening questions prior to the visit, additional usage of staff PPE, and extensive cleaning of exam room while observing appropriate contact time as indicated for disinfecting solutions.    HPI The patient is here for an acute visit.   2 weeks ago he started experiencing a rash on the medial aspect of his eyes that went to his upper eyelids.  The rash was red, itchy and his eyelids were swollen.  He denies any discharge from the eyes or redness of the whites of his eyes.  He denies any pain.  He denies any changes in vision.  He also states itching within his ears and has had a dry, bloody nose.  He did have sores inside the nose, but those have resolved.  He has had a perioral rash that is starting to get better.   He has been using warm compresses on his eyes and does not sure what else he can do for the other symptoms.  They are getting better, but he is not sure why he has them.   Medications and allergies reviewed with patient and updated if appropriate.  Patient Active Problem List   Diagnosis Date Noted   Emphysema lung ([place]) 07/02/2021   Lung nodules 07/02/2021   Chronic cough 07/02/2021   Lumbar radiculopathy L5 s/p L4-5 laminectomy 7/22 02/06/2021   Allergic rhinitis 01/18/2020   Aortic atherosclerosis ([place]) 01/18/2020   Biceps tendinitis of left shoulder 02/22/2019   Medial epicondylitis of elbow, right 05/17/2018   Lateral epicondylitis of right elbow 05/17/2018   DIVERTICULOSIS, COLON 10/17/2009   H/O melanoma in situ 10/17/2009   [place] DISEASE, LUMBAR 12/03/2008   Morton's neuroma of both feet 03/13/2008   VERTIGO, BENIGN PAROXYSMAL POSITION 04/28/2007   Hyperlipidemia 03/12/2007   COLONIC POLYPS, HX OF 03/12/2007    Current Outpatient Medications on File  Prior to Visit  Medication Sig Dispense Refill   albuterol (VENTOLIN HFA) 108 (90 Base) MCG/ACT inhaler Inhale 1-2 puffs into the lungs every 6 (six) hours as needed. 8 g 2   Coenzyme Q10 (COQ-10 PO) Take by mouth.     Doxylamine Succinate, Sleep, (SLEEP AID PO) Take 1 capsule by mouth at bedtime as needed (sleep).     famotidine (PEPCID) 20 MG tablet Take 20 mg by mouth 2 (two) times daily.     fexofenadine (ALLEGRA) 180 MG tablet Take 180 mg by mouth daily as needed for allergies or rhinitis.     fexofenadine (ALLEGRA) 60 MG tablet      fluticasone (FLONASE) 50 MCG/ACT nasal spray Place 2 sprays into both nostrils daily as needed for allergies or rhinitis.     Multiple Vitamins-Minerals (ALIVE MULTI-VITAMIN PO) Take by mouth.     Olopatadine HCl 0.2 % SOLN 1 drop into affected eye     simvastatin (ZOCOR) 40 MG tablet TAKE ONE TABLET BY MOUTH ONE TIME DAILY 90 tablet 0   tamsulosin (FLOMAX) 0.4 MG CAPS capsule Take 0.4 mg by mouth daily.     No current facility-administered medications on file prior to visit.    Past Medical History:  Diagnosis Date   Allergy    SEASONAL   Arthritis    Asthma    COLONIC POLYPS, HX OF 03/12/2007   [place] DISEASE, LUMBAR 12/03/2008  DIVERTICULOSIS, COLON 10/17/2009   History of kidney stones    Hydrocele    HYPERLIPIDEMIA 03/12/2007   Melanoma ([place]) 04/23/2020   in situ-Left Upper Arm   MORTON'S NEUROMA, RIGHT 03/13/2008   SCC (squamous cell carcinoma) 04/26/2013   right hand-txpbx   SKIN CANCER, HX OF 10/17/2009   VERTIGO, BENIGN PAROXYSMAL POSITION 04/28/2007    Past Surgical History:  Procedure Laterality Date   BACK SURGERY     COLONOSCOPY W/ BIOPSIES     EYE SURGERY Left    LUMBAR LAMINECTOMY/ DECOMPRESSION WITH MET-RX Left 03/01/2021   Procedure: LEFT LUMBAR FOUR-FIVE MINIMALLY INVASIVE LAMINECTOMY, LATERAL RECESS DECOMPRESSION AND RESECTION OF SYNOVIAL CYST;  Surgeon: Karsten Ro, DO;  Location: [facility];  Service: Neurosurgery;   Laterality: Left;   MOHS SURGERY     SPINE SURGERY     lumbar disk surgery    Social History   Socioeconomic History   Marital status: Married    Spouse name: Not on file   Number of children: 3   Years of education: Not on file   Highest education level: Not on file  Occupational History   Occupation: Retired  Tobacco Use   Smoking status: Former    Packs/day: 1.00    Years: 20.00    Pack years: 20.00    Types: Cigarettes    Quit date: 10/30/1978    Years since quitting: 42.7   Smokeless tobacco: Never  Vaping Use   Vaping Use: Never used  Substance and Sexual Activity   Alcohol use: Yes    Comment: 1 burben and 1 beer    Drug use: No   Sexual activity: Not on file  Other Topics Concern   Not on file  Social History Narrative   Retired from [employer] at [employer]   Enjoys golfing   Three daughters   Married     Right Handed    Has 2 granddaughters 1 grandson    Social Determinants of Health   Financial Resource Strain: Not on file  Food Insecurity: Not on file  Transportation Needs: Not on file  Physical Activity: Not on file  Stress: Not on file  Social Connections: Not on file    Family History  Problem Relation Age of Onset   Lung cancer Mother    Ovarian cancer Mother    COPD Father    Heart attack Father    Alcohol abuse Brother    Lung cancer Brother        smoker   Breast cancer Daughter    Breast cancer Daughter    Colon cancer Neg Hx    Esophageal cancer Neg Hx    Rectal cancer Neg Hx    Stomach cancer Neg Hx     Review of Systems  Constitutional:  Negative for fever.  HENT:  Negative for congestion, ear pain, sinus pain and sore throat.   Respiratory:  Negative for cough, shortness of breath and wheezing.   Skin:  Positive for rash.      Objective:   Vitals:   07/08/21 1455  BP: 124/78  Pulse: 60  Temp: 98.5 F (36.9 C)  SpO2: 97%   BP Readings from Last 3 Encounters:  07/08/21 124/78  07/02/21 134/70   05/16/21 128/68   Wt Readings from Last 3 Encounters:  07/08/21 241 lb (109.3 kg)  07/02/21 236 lb (107 kg)  05/16/21 244 lb 6.4 oz (110.9 kg)   Body mass index is 34.58 kg/m.  Physical Exam    GENERAL APPEARANCE: Appears stated age, well appearing, NAD EYES: conjunctiva clear, no icterus HENT: bilateral tympanic membranes normal.  Bilateral ear canals with mild erythema, swelling and dryness with flaking skin.  oropharynx with no erythema or exudates, trachea midline, no cervical or supraclavicular lymphadenopathy LUNGS: Unlabored breathing, good air entry bilaterally, clear to auscultation without wheeze or crackles CARDIOVASCULAR: Normal S1,S2 , no edema SKIN: Warm, dry.  Macular papular perioral rash.  Mild generalized erythema and slight swelling medial aspects of bilateral upper eyelids      Assessment & Plan:    See Problem List for Assessment and Plan of chronic medical problems.

## 2021-07-08 ENCOUNTER — Ambulatory Visit (INDEPENDENT_AMBULATORY_CARE_PROVIDER_SITE_OTHER): Payer: Medicare Other | Admitting: Internal Medicine

## 2021-07-08 ENCOUNTER — Other Ambulatory Visit: Payer: Self-pay

## 2021-07-08 ENCOUNTER — Encounter: Payer: Self-pay | Admitting: Internal Medicine

## 2021-07-08 DIAGNOSIS — H01119 Allergic dermatitis of unspecified eye, unspecified eyelid: Secondary | ICD-10-CM | POA: Diagnosis not present

## 2021-07-08 DIAGNOSIS — H60543 Acute eczematoid otitis externa, bilateral: Secondary | ICD-10-CM | POA: Insufficient documentation

## 2021-07-08 DIAGNOSIS — L710 Perioral dermatitis: Secondary | ICD-10-CM | POA: Diagnosis not present

## 2021-07-08 MED ORDER — PIMECROLIMUS 1 % EX CREA: TOPICAL_CREAM | Freq: Two times a day (BID) | CUTANEOUS | 0 refills | Status: DC

## 2021-07-08 NOTE — Patient Instructions (Addendum)
     Medications changes include :   pimecrolimus 1 cream twice daily  Your prescription(s) have been submitted to your pharmacy. Please take as directed and contact our office if you believe you are having problem(s) with the medication(s).

## 2021-07-08 NOTE — Assessment & Plan Note (Signed)
Acute Unknown cause, likely allergic in nature Slight improvement Elidel 1% cream twice daily until rash is gone

## 2021-07-08 NOTE — Assessment & Plan Note (Signed)
Acute Unknown cause, likely allergic or eczema in nature Slight improvement Elidel 1% cream twice daily until rash is gone

## 2021-07-10 DIAGNOSIS — M48061 Spinal stenosis, lumbar region without neurogenic claudication: Secondary | ICD-10-CM | POA: Diagnosis not present

## 2021-07-25 ENCOUNTER — Encounter: Payer: Self-pay | Admitting: Internal Medicine

## 2021-08-12 ENCOUNTER — Encounter: Payer: Self-pay | Admitting: Internal Medicine

## 2021-08-12 DIAGNOSIS — R7303 Prediabetes: Secondary | ICD-10-CM | POA: Insufficient documentation

## 2021-08-12 DIAGNOSIS — E1169 Type 2 diabetes mellitus with other specified complication: Secondary | ICD-10-CM | POA: Insufficient documentation

## 2021-08-12 DIAGNOSIS — R739 Hyperglycemia, unspecified: Secondary | ICD-10-CM | POA: Insufficient documentation

## 2021-08-12 DIAGNOSIS — E119 Type 2 diabetes mellitus without complications: Secondary | ICD-10-CM | POA: Insufficient documentation

## 2021-08-12 NOTE — Patient Instructions (Addendum)
Blood work was ordered.     Medications changes include :   None   A Ct scan of your heart arteries was ordered.  Someone from their office will call you to schedule an appointment.    Please followup in 1 year   Health Maintenance, Male Adopting a healthy lifestyle and getting preventive care are important in promoting health and wellness. Ask your health care provider about: The right schedule for you to have regular tests and exams. Things you can do on your own to prevent diseases and keep yourself healthy. What should I know about diet, weight, and exercise? Eat a healthy diet  Eat a diet that includes plenty of vegetables, fruits, low-fat dairy products, and lean protein. Do not eat a lot of foods that are high in solid fats, added sugars, or sodium. Maintain a healthy weight Body mass index (BMI) is a measurement that can be used to identify possible weight problems. It estimates body fat based on height and weight. Your health care provider can help determine your BMI and help you achieve or maintain a healthy weight. Get regular exercise Get regular exercise. This is one of the most important things you can do for your health. Most adults should: Exercise for at least 150 minutes each week. The exercise should increase your heart rate and make you sweat (moderate-intensity exercise). Do strengthening exercises at least twice a week. This is in addition to the moderate-intensity exercise. Spend less time sitting. Even light physical activity can be beneficial. Watch cholesterol and blood lipids Have your blood tested for lipids and cholesterol at 82 years of age, then have this test every 5 years. You may need to have your cholesterol levels checked more often if: Your lipid or cholesterol levels are high. You are older than 82 years of age. You are at high risk for heart disease. What should I know about cancer screening? Many types of cancers can be detected early and  may often be prevented. Depending on your health history and family history, you may need to have cancer screening at various ages. This may include screening for: Colorectal cancer. Prostate cancer. Skin cancer. Lung cancer. What should I know about heart disease, diabetes, and high blood pressure? Blood pressure and heart disease High blood pressure causes heart disease and increases the risk of stroke. This is more likely to develop in people who have high blood pressure readings or are overweight. Talk with your health care provider about your target blood pressure readings. Have your blood pressure checked: Every 3-5 years if you are 33-68 years of age. Every year if you are 90 years old or older. If you are between the ages of 50 and 6 and are a current or former smoker, ask your health care provider if you should have a one-time screening for abdominal aortic aneurysm (AAA). Diabetes Have regular diabetes screenings. This checks your fasting blood sugar level. Have the screening done: Once every three years after age 29 if you are at a normal weight and have a low risk for diabetes. More often and at a younger age if you are overweight or have a high risk for diabetes. What should I know about preventing infection? Hepatitis B If you have a higher risk for hepatitis B, you should be screened for this virus. Talk with your health care provider to find out if you are at risk for hepatitis B infection. Hepatitis C Blood testing is recommended for: Everyone born from [birth year] through  [place] with known risk factors for hepatitis C. Sexually transmitted infections (STIs) You should be screened each year for STIs, including gonorrhea and chlamydia, if: You are sexually active and are younger than 82 years of age. You are older than 82 years of age and your health care provider tells you that you are at risk for this type of infection. Your sexual activity has changed since you were  last screened, and you are at increased risk for chlamydia or gonorrhea. Ask your health care provider if you are at risk. Ask your health care provider about whether you are at high risk for HIV. Your health care provider may recommend a prescription medicine to help prevent HIV infection. If you choose to take medicine to prevent HIV, you should first get tested for HIV. You should then be tested every 3 months for as long as you are taking the medicine. Follow these instructions at home: Alcohol use Do not drink alcohol if your health care provider tells you not to drink. If you drink alcohol: Limit how much you have to 0-2 drinks a day. Know how much alcohol is in your drink. In the [place] one drink equals one 12 oz bottle of beer (355 mL), one 5 oz glass of wine (148 mL), or one 1 oz glass of hard liquor (44 mL). Lifestyle Do not use any products that contain nicotine or tobacco. These products include cigarettes, chewing tobacco, and vaping devices, such as e-cigarettes. If you need help quitting, ask your health care provider. Do not use street drugs. Do not share needles. Ask your health care provider for help if you need support or information about quitting drugs. General instructions Schedule regular health, dental, and eye exams. Stay current with your vaccines. Tell your health care provider if: You often feel depressed. You have ever been abused or do not feel safe at home. Summary Adopting a healthy lifestyle and getting preventive care are important in promoting health and wellness. Follow your health care provider's instructions about healthy diet, exercising, and getting tested or screened for diseases. Follow your health care provider's instructions on monitoring your cholesterol and blood pressure. This information is not intended to replace advice given to you by your health care provider. Make sure you discuss any questions you have with your health care  provider. Document Revised: 12/10/2020 Document Reviewed: 12/10/2020 Elsevier Patient Education  [place].

## 2021-08-12 NOTE — Progress Notes (Signed)
Subjective:    Patient ID: Daniel Hampton, male    DOB: Jun 28, 1941, 81 y.o.   MRN: [id number]   This visit occurred during the SARS-CoV-2 public health emergency.  Safety protocols were in place, including screening questions prior to the visit, additional usage of staff PPE, and extensive cleaning of exam room while observing appropriate contact time as indicated for disinfecting solutions.   HPI He is here for a physical exam.   Still has rash around his mouth, still has itching on the back of his head.     Medications and allergies reviewed with patient and updated if appropriate.  Patient Active Problem List   Diagnosis Date Noted   Screening for heart disease 08/13/2021   Hyperglycemia 08/12/2021   Perioral dermatitis 07/08/2021   Eyelid dermatitis, allergic/contact 07/08/2021   Dermatitis of ear canal, bilateral 07/08/2021   Emphysema lung ([place]) 07/02/2021   Lung nodules 07/02/2021   Chronic cough 07/02/2021   Lumbar radiculopathy L5 s/p L4-5 laminectomy 7/22 02/06/2021   Allergic rhinitis 01/18/2020   Aortic atherosclerosis ([place]) 01/18/2020   Biceps tendinitis of left shoulder 02/22/2019   Medial epicondylitis of elbow, right 05/17/2018   Lateral epicondylitis of right elbow 05/17/2018   DIVERTICULOSIS, COLON 10/17/2009   H/O melanoma in situ 10/17/2009   [place] DISEASE, LUMBAR 12/03/2008   Morton's neuroma of both feet 03/13/2008   VERTIGO, BENIGN PAROXYSMAL POSITION 04/28/2007   Hyperlipidemia 03/12/2007   COLONIC POLYPS, HX OF 03/12/2007    Current Outpatient Medications on File Prior to Visit  Medication Sig Dispense Refill   albuterol (VENTOLIN HFA) 108 (90 Base) MCG/ACT inhaler Inhale 1-2 puffs into the lungs every 6 (six) hours as needed. 8 g 2   Coenzyme Q10 (COQ-10 PO) Take by mouth.     Doxylamine Succinate, Sleep, (SLEEP AID PO) Take 1 capsule by mouth at bedtime as needed (sleep).     famotidine (PEPCID) 20 MG tablet Take 20 mg by mouth 2 (two) times  daily.     fluticasone (FLONASE) 50 MCG/ACT nasal spray Place 2 sprays into both nostrils daily as needed for allergies or rhinitis.     Multiple Vitamins-Minerals (ALIVE MULTI-VITAMIN PO) Take by mouth.     Olopatadine HCl 0.2 % SOLN 1 drop into affected eye     simvastatin (ZOCOR) 40 MG tablet TAKE ONE TABLET BY MOUTH ONE TIME DAILY 90 tablet 0   tamsulosin (FLOMAX) 0.4 MG CAPS capsule Take 0.4 mg by mouth daily.     No current facility-administered medications on file prior to visit.    Past Medical History:  Diagnosis Date   Allergy    SEASONAL   Arthritis    Asthma    COLONIC POLYPS, HX OF 03/12/2007   [place] DISEASE, LUMBAR 12/03/2008   DIVERTICULOSIS, COLON 10/17/2009   History of kidney stones    Hydrocele    HYPERLIPIDEMIA 03/12/2007   Melanoma ([place]) 04/23/2020   in situ-Left Upper Arm   MORTON'S NEUROMA, RIGHT 03/13/2008   SCC (squamous cell carcinoma) 04/26/2013   right hand-txpbx   SKIN CANCER, HX OF 10/17/2009   VERTIGO, BENIGN PAROXYSMAL POSITION 04/28/2007    Past Surgical History:  Procedure Laterality Date   BACK SURGERY     COLONOSCOPY W/ BIOPSIES     EYE SURGERY Left    LUMBAR LAMINECTOMY/ DECOMPRESSION WITH MET-RX Left 03/01/2021   Procedure: LEFT LUMBAR FOUR-FIVE MINIMALLY INVASIVE LAMINECTOMY, LATERAL RECESS DECOMPRESSION AND RESECTION OF SYNOVIAL CYST;  Surgeon: Karsten Ro, DO;  Location: [facility];  Service: Neurosurgery;  Laterality: Left;   MOHS SURGERY     SPINE SURGERY     lumbar disk surgery    Social History   Socioeconomic History   Marital status: Married    Spouse name: Not on file   Number of children: 3   Years of education: Not on file   Highest education level: Not on file  Occupational History   Occupation: Retired  Tobacco Use   Smoking status: Former    Packs/day: 1.00    Years: 20.00    Pack years: 20.00    Types: Cigarettes    Quit date: 10/30/1978    Years since quitting: 42.8   Smokeless tobacco: Never  Vaping Use    Vaping Use: Never used  Substance and Sexual Activity   Alcohol use: Yes    Comment: 1 burben and 1 beer    Drug use: No   Sexual activity: Not on file  Other Topics Concern   Not on file  Social History Narrative   Retired from [employer] at [employer]   Enjoys golfing   Three daughters   Married     Right Handed    Has 2 granddaughters 1 grandson    Social Determinants of Health   Financial Resource Strain: Not on file  Food Insecurity: Not on file  Transportation Needs: Not on file  Physical Activity: Not on file  Stress: Not on file  Social Connections: Not on file    Family History  Problem Relation Age of Onset   Lung cancer Mother    Ovarian cancer Mother    COPD Father    Heart attack Father    Alcohol abuse Brother    Lung cancer Brother        smoker   Breast cancer Daughter    Breast cancer Daughter    Colon cancer Neg Hx    Esophageal cancer Neg Hx    Rectal cancer Neg Hx    Stomach cancer Neg Hx     Review of Systems  Constitutional:  Negative for chills and fever.  Eyes:  Negative for visual disturbance.  Respiratory:  Negative for cough, shortness of breath and wheezing.   Cardiovascular:  Negative for chest pain, palpitations and leg swelling.  Gastrointestinal:  Negative for abdominal pain, constipation, diarrhea and nausea.  Genitourinary:  Negative for difficulty urinating and dysuria.  Musculoskeletal:  Positive for arthralgias (mild). Negative for back pain.  Skin:  Positive for rash.  Neurological:  Negative for light-headedness and headaches.  Psychiatric/Behavioral:  Negative for dysphoric mood. The patient is not nervous/anxious.       Objective:   Vitals:   08/13/21 0953  BP: 118/70  Pulse: 84  Temp: 98.2 F (36.8 C)  SpO2: 95%   Filed Weights   08/13/21 0953  Weight: 236 lb (107 kg)   Body mass index is 33.86 kg/m.  BP Readings from Last 3 Encounters:  08/13/21 118/70  07/08/21 124/78  07/02/21 134/70     Wt Readings from Last 3 Encounters:  08/13/21 236 lb (107 kg)  07/08/21 241 lb (109.3 kg)  07/02/21 236 lb (107 kg)    Depression screen [facility] 2/9 08/13/2021 03/05/2020 04/27/2019 10/22/2017 09/10/2015  Decreased Interest 0 0 0 0 0  Down, Depressed, Hopeless 0 0 0 0 0  PHQ - 2 Score 0 0 0 0 0  Altered sleeping 0 - - - -  Tired, decreased energy 0 - - - -  Change in appetite 0 - - - -  Feeling bad or failure about yourself  0 - - - -  Trouble concentrating 0 - - - -  Moving slowly or fidgety/restless 0 - - - -  Suicidal thoughts 0 - - - -  PHQ-9 Score 0 - - - -    GAD 7 : Generalized Anxiety Score 08/13/2021  Nervous, Anxious, on Edge 0  Control/stop worrying 0  Worry too much - different things 0  Trouble relaxing 0  Restless 0  Easily annoyed or irritable 0  Afraid - awful might happen 0  Total GAD 7 Score 0       Physical Exam Constitutional: He appears well-developed and well-nourished. No distress.  HENT:  Head: Normocephalic and atraumatic.  Right Ear: External ear normal.  Left Ear: External ear normal.  Mouth/Throat: Oropharynx is clear and moist.  Normal ear canals and TM b/l  Eyes: Conjunctivae and EOM are normal.  Neck: Neck supple. No tracheal deviation present. No thyromegaly present.  No carotid bruit  Cardiovascular: Normal rate, regular rhythm, normal heart sounds and intact distal pulses.   2/6 sys murmur heard. Pulmonary/Chest: Effort normal and breath sounds normal. No respiratory distress. He has no wheezes. He has no rales.  Abdominal: Soft. He exhibits no distension. There is no tenderness.  Genitourinary: deferred  Musculoskeletal: He exhibits no edema.  Lymphadenopathy:   He has no cervical adenopathy.  Skin: Skin is warm and dry. He is not diaphoretic.  Psychiatric: He has a normal mood and affect. His behavior is normal.         Assessment & Plan:   Physical exam: Screening blood work  ordered Exercise   regular Weight  encouraged  weight loss Substance abuse   none   Screened for depression using the PHQ 9 scale.  No evidence of depression.   Screened for anxiety using GAD7 Scale.  No evidence of anxiety.   Reviewed recommended immunizations.  Will see GI to discuss colonoscopy   Health Maintenance  Topic Date Due   COLONOSCOPY (Pts 45-57yr Insurance coverage will need to be confirmed)  01/30/2021   COVID-19 Vaccine (4 - Booster for Pfizer series) 08/29/2021 (Originally 07/31/2020)   TETANUS/TDAP  08/13/2022 (Originally 08/04/2016)   Pneumonia Vaccine 82 Years old  Completed   INFLUENZA VACCINE  Completed   HPV VACCINES  Aged Out   Zoster Vaccines- Shingrix  Discontinued     See Problem List for Assessment and Plan of chronic medical problems.

## 2021-08-13 ENCOUNTER — Ambulatory Visit (INDEPENDENT_AMBULATORY_CARE_PROVIDER_SITE_OTHER): Payer: Medicare Other | Admitting: Internal Medicine

## 2021-08-13 ENCOUNTER — Other Ambulatory Visit: Payer: Self-pay

## 2021-08-13 VITALS — BP 118/70 | HR 84 | Temp 98.2°F | Ht 70.0 in | Wt 236.0 lb

## 2021-08-13 DIAGNOSIS — I700 Atherosclerosis of aorta: Secondary | ICD-10-CM

## 2021-08-13 DIAGNOSIS — R739 Hyperglycemia, unspecified: Secondary | ICD-10-CM

## 2021-08-13 DIAGNOSIS — Z0000 Encounter for general adult medical examination without abnormal findings: Secondary | ICD-10-CM

## 2021-08-13 DIAGNOSIS — L710 Perioral dermatitis: Secondary | ICD-10-CM

## 2021-08-13 DIAGNOSIS — E782 Mixed hyperlipidemia: Secondary | ICD-10-CM | POA: Diagnosis not present

## 2021-08-13 DIAGNOSIS — Z136 Encounter for screening for cardiovascular disorders: Secondary | ICD-10-CM | POA: Insufficient documentation

## 2021-08-13 DIAGNOSIS — Z1331 Encounter for screening for depression: Secondary | ICD-10-CM | POA: Diagnosis not present

## 2021-08-13 LAB — CBC WITH DIFFERENTIAL/PLATELET
Basophils Absolute: 0.1 10*3/uL (ref 0.0–0.1)
Basophils Relative: 0.5 % (ref 0.0–3.0)
Eosinophils Absolute: 0.2 10*3/uL (ref 0.0–0.7)
Eosinophils Relative: 2.2 % (ref 0.0–5.0)
HCT: 45 % (ref 39.0–52.0)
Hemoglobin: 15.1 g/dL (ref 13.0–17.0)
Lymphocytes Relative: 27.7 % (ref 12.0–46.0)
Lymphs Abs: 2.7 10*3/uL (ref 0.7–4.0)
MCHC: 33.6 g/dL (ref 30.0–36.0)
MCV: 92.2 fl (ref 78.0–100.0)
Monocytes Absolute: 0.8 10*3/uL (ref 0.1–1.0)
Monocytes Relative: 8.6 % (ref 3.0–12.0)
Neutro Abs: 6 10*3/uL (ref 1.4–7.7)
Neutrophils Relative %: 61 % (ref 43.0–77.0)
Platelets: 224 10*3/uL (ref 150.0–400.0)
RBC: 4.88 Mil/uL (ref 4.22–5.81)
RDW: 13 % (ref 11.5–15.5)
WBC: 9.8 10*3/uL (ref 4.0–10.5)

## 2021-08-13 LAB — COMPREHENSIVE METABOLIC PANEL
ALT: 53 U/L (ref 0–53)
AST: 47 U/L — ABNORMAL HIGH (ref 0–37)
Albumin: 4.6 g/dL (ref 3.5–5.2)
Alkaline Phosphatase: 47 U/L (ref 39–117)
BUN: 20 mg/dL (ref 6–23)
CO2: 27 mEq/L (ref 19–32)
Calcium: 9.3 mg/dL (ref 8.4–10.5)
Chloride: 102 mEq/L (ref 96–112)
Creatinine, Ser: 0.98 mg/dL (ref 0.40–1.50)
GFR: 72.27 mL/min (ref >60.00)
Glucose, Bld: 88 mg/dL (ref 70–99)
Potassium: 4.2 mEq/L (ref 3.5–5.1)
Sodium: 139 mEq/L (ref 135–145)
Total Bilirubin: 0.8 mg/dL (ref 0.2–1.2)
Total Protein: 7.4 g/dL (ref 6.0–8.3)

## 2021-08-13 LAB — LIPID PANEL
Cholesterol: 173 mg/dL (ref 0–200)
HDL: 44.7 mg/dL (ref >39.00)
LDL Cholesterol: 91 mg/dL (ref 0–99)
NonHDL: 128.19
Total CHOL/HDL Ratio: 4
Triglycerides: 187 mg/dL — ABNORMAL HIGH (ref 0.0–149.0)
VLDL: 37.4 mg/dL (ref 0.0–40.0)

## 2021-08-13 LAB — HEMOGLOBIN A1C: Hgb A1c MFr Bld: 6.1 % (ref 4.6–6.5)

## 2021-08-13 MED ORDER — CETIRIZINE HCL 10 MG PO TABS: 10.0000 mg | ORAL_TABLET | Freq: Every day | ORAL | 11 refills | Status: DC

## 2021-08-13 MED ORDER — METRONIDAZOLE 0.75 % EX LOTN: TOPICAL_LOTION | CUTANEOUS | 0 refills | Status: DC

## 2021-08-13 NOTE — Assessment & Plan Note (Signed)
Chronic Continue simvastatin 40 mg daily Encouraged heart healthy diet and regular exercise

## 2021-08-13 NOTE — Assessment & Plan Note (Signed)
Daughter died of MI last year He has several risk factors for CAD - had Ct scan of lungs with coronary artery disease Ct Coronary artery calcium score ordered Continue simvastatin 40 mg daily  - likely need to consider changing to crestor - will await Ct scan results

## 2021-08-13 NOTE — Assessment & Plan Note (Signed)
Subacute elidel not covered Start metronidazole lotion daily

## 2021-08-13 NOTE — Assessment & Plan Note (Signed)
Chronic Check a1c Low sugar / carb diet Stressed regular exercise  

## 2021-08-13 NOTE — Assessment & Plan Note (Signed)
Chronic Regular exercise and healthy diet encouraged Check lipid panel  Continue simvastatin 40 mg daily 

## 2021-08-15 ENCOUNTER — Telehealth: Payer: Self-pay

## 2021-08-15 MED ORDER — METRONIDAZOLE 0.75 % EX LOTN: TOPICAL_LOTION | CUTANEOUS | 0 refills | Status: DC

## 2021-08-15 NOTE — Telephone Encounter (Addendum)
Pharmacy states that she needs a "day supply" on the Rx. METRONIDAZOLE, TOPICAL, 0.75 % LOTN

## 2021-08-15 NOTE — Telephone Encounter (Signed)
Script was sent in on yesterday to pharmacy.

## 2021-08-23 DIAGNOSIS — H1031 Unspecified acute conjunctivitis, right eye: Secondary | ICD-10-CM | POA: Diagnosis not present

## 2021-08-26 DIAGNOSIS — I10 Essential (primary) hypertension: Secondary | ICD-10-CM | POA: Diagnosis not present

## 2021-08-26 DIAGNOSIS — H16001 Unspecified corneal ulcer, right eye: Secondary | ICD-10-CM | POA: Diagnosis not present

## 2021-08-26 DIAGNOSIS — Z23 Encounter for immunization: Secondary | ICD-10-CM | POA: Diagnosis not present

## 2021-08-26 DIAGNOSIS — H16011 Central corneal ulcer, right eye: Secondary | ICD-10-CM | POA: Diagnosis not present

## 2021-08-26 DIAGNOSIS — H44003 Unspecified purulent endophthalmitis, bilateral: Secondary | ICD-10-CM | POA: Diagnosis not present

## 2021-08-26 DIAGNOSIS — H40051 Ocular hypertension, right eye: Secondary | ICD-10-CM | POA: Diagnosis not present

## 2021-08-27 DIAGNOSIS — Z79899 Other long term (current) drug therapy: Secondary | ICD-10-CM | POA: Diagnosis not present

## 2021-08-27 DIAGNOSIS — H40051 Ocular hypertension, right eye: Secondary | ICD-10-CM | POA: Diagnosis not present

## 2021-08-27 DIAGNOSIS — H16001 Unspecified corneal ulcer, right eye: Secondary | ICD-10-CM | POA: Diagnosis not present

## 2021-08-27 DIAGNOSIS — B9561 Methicillin susceptible Staphylococcus aureus infection as the cause of diseases classified elsewhere: Secondary | ICD-10-CM | POA: Diagnosis not present

## 2021-08-27 DIAGNOSIS — H16031 Corneal ulcer with hypopyon, right eye: Secondary | ICD-10-CM | POA: Diagnosis not present

## 2021-08-30 DIAGNOSIS — H16001 Unspecified corneal ulcer, right eye: Secondary | ICD-10-CM | POA: Diagnosis not present

## 2021-09-02 DIAGNOSIS — H16001 Unspecified corneal ulcer, right eye: Secondary | ICD-10-CM | POA: Diagnosis not present

## 2021-09-02 DIAGNOSIS — H40051 Ocular hypertension, right eye: Secondary | ICD-10-CM | POA: Diagnosis not present

## 2021-09-04 DIAGNOSIS — Z79899 Other long term (current) drug therapy: Secondary | ICD-10-CM | POA: Diagnosis not present

## 2021-09-04 DIAGNOSIS — B9561 Methicillin susceptible Staphylococcus aureus infection as the cause of diseases classified elsewhere: Secondary | ICD-10-CM | POA: Diagnosis not present

## 2021-09-04 DIAGNOSIS — H40051 Ocular hypertension, right eye: Secondary | ICD-10-CM | POA: Diagnosis not present

## 2021-09-04 DIAGNOSIS — H16031 Corneal ulcer with hypopyon, right eye: Secondary | ICD-10-CM | POA: Diagnosis not present

## 2021-09-04 DIAGNOSIS — H16001 Unspecified corneal ulcer, right eye: Secondary | ICD-10-CM | POA: Diagnosis not present

## 2021-09-06 DIAGNOSIS — H20051 Hypopyon, right eye: Secondary | ICD-10-CM | POA: Diagnosis not present

## 2021-09-06 DIAGNOSIS — H16001 Unspecified corneal ulcer, right eye: Secondary | ICD-10-CM | POA: Diagnosis not present

## 2021-09-06 DIAGNOSIS — H40051 Ocular hypertension, right eye: Secondary | ICD-10-CM | POA: Diagnosis not present

## 2021-09-06 DIAGNOSIS — H2101 Hyphema, right eye: Secondary | ICD-10-CM | POA: Diagnosis not present

## 2021-09-09 DIAGNOSIS — H40051 Ocular hypertension, right eye: Secondary | ICD-10-CM | POA: Diagnosis not present

## 2021-09-09 DIAGNOSIS — Z79899 Other long term (current) drug therapy: Secondary | ICD-10-CM | POA: Diagnosis not present

## 2021-09-09 DIAGNOSIS — H16031 Corneal ulcer with hypopyon, right eye: Secondary | ICD-10-CM | POA: Diagnosis not present

## 2021-09-09 DIAGNOSIS — H20051 Hypopyon, right eye: Secondary | ICD-10-CM | POA: Diagnosis not present

## 2021-09-09 DIAGNOSIS — H2101 Hyphema, right eye: Secondary | ICD-10-CM | POA: Diagnosis not present

## 2021-09-09 DIAGNOSIS — H16001 Unspecified corneal ulcer, right eye: Secondary | ICD-10-CM | POA: Diagnosis not present

## 2021-09-09 DIAGNOSIS — B9561 Methicillin susceptible Staphylococcus aureus infection as the cause of diseases classified elsewhere: Secondary | ICD-10-CM | POA: Diagnosis not present

## 2021-09-09 DIAGNOSIS — Z792 Long term (current) use of antibiotics: Secondary | ICD-10-CM | POA: Diagnosis not present

## 2021-09-13 DIAGNOSIS — H20051 Hypopyon, right eye: Secondary | ICD-10-CM | POA: Diagnosis not present

## 2021-09-13 DIAGNOSIS — H40051 Ocular hypertension, right eye: Secondary | ICD-10-CM | POA: Diagnosis not present

## 2021-09-13 DIAGNOSIS — H16001 Unspecified corneal ulcer, right eye: Secondary | ICD-10-CM | POA: Diagnosis not present

## 2021-09-13 DIAGNOSIS — Z79899 Other long term (current) drug therapy: Secondary | ICD-10-CM | POA: Diagnosis not present

## 2021-09-13 DIAGNOSIS — H2101 Hyphema, right eye: Secondary | ICD-10-CM | POA: Diagnosis not present

## 2021-09-16 DIAGNOSIS — H16001 Unspecified corneal ulcer, right eye: Secondary | ICD-10-CM | POA: Diagnosis not present

## 2021-09-16 DIAGNOSIS — B9561 Methicillin susceptible Staphylococcus aureus infection as the cause of diseases classified elsewhere: Secondary | ICD-10-CM | POA: Diagnosis not present

## 2021-09-16 DIAGNOSIS — H40051 Ocular hypertension, right eye: Secondary | ICD-10-CM | POA: Diagnosis not present

## 2021-09-16 DIAGNOSIS — H16031 Corneal ulcer with hypopyon, right eye: Secondary | ICD-10-CM | POA: Diagnosis not present

## 2021-09-16 DIAGNOSIS — Z79899 Other long term (current) drug therapy: Secondary | ICD-10-CM | POA: Diagnosis not present

## 2021-09-20 DIAGNOSIS — H21541 Posterior synechiae (iris), right eye: Secondary | ICD-10-CM | POA: Diagnosis not present

## 2021-09-20 DIAGNOSIS — H40051 Ocular hypertension, right eye: Secondary | ICD-10-CM | POA: Diagnosis not present

## 2021-09-20 DIAGNOSIS — H169 Unspecified keratitis: Secondary | ICD-10-CM | POA: Diagnosis not present

## 2021-09-20 DIAGNOSIS — H20051 Hypopyon, right eye: Secondary | ICD-10-CM | POA: Diagnosis not present

## 2021-09-20 DIAGNOSIS — H16001 Unspecified corneal ulcer, right eye: Secondary | ICD-10-CM | POA: Diagnosis not present

## 2021-09-23 DIAGNOSIS — H16001 Unspecified corneal ulcer, right eye: Secondary | ICD-10-CM | POA: Insufficient documentation

## 2021-09-23 DIAGNOSIS — H40051 Ocular hypertension, right eye: Secondary | ICD-10-CM | POA: Diagnosis not present

## 2021-09-23 DIAGNOSIS — Z79899 Other long term (current) drug therapy: Secondary | ICD-10-CM | POA: Diagnosis not present

## 2021-09-23 DIAGNOSIS — H20051 Hypopyon, right eye: Secondary | ICD-10-CM | POA: Diagnosis not present

## 2021-09-25 DIAGNOSIS — H40051 Ocular hypertension, right eye: Secondary | ICD-10-CM | POA: Diagnosis not present

## 2021-09-25 DIAGNOSIS — H16031 Corneal ulcer with hypopyon, right eye: Secondary | ICD-10-CM | POA: Diagnosis not present

## 2021-09-25 DIAGNOSIS — H21511 Anterior synechiae (iris), right eye: Secondary | ICD-10-CM | POA: Diagnosis not present

## 2021-09-25 DIAGNOSIS — H16001 Unspecified corneal ulcer, right eye: Secondary | ICD-10-CM | POA: Diagnosis not present

## 2021-09-25 DIAGNOSIS — H20051 Hypopyon, right eye: Secondary | ICD-10-CM | POA: Diagnosis not present

## 2021-09-26 DIAGNOSIS — Z9889 Other specified postprocedural states: Secondary | ICD-10-CM | POA: Diagnosis not present

## 2021-09-26 DIAGNOSIS — H2511 Age-related nuclear cataract, right eye: Secondary | ICD-10-CM | POA: Diagnosis not present

## 2021-09-26 DIAGNOSIS — Z48810 Encounter for surgical aftercare following surgery on the sense organs: Secondary | ICD-10-CM | POA: Diagnosis not present

## 2021-09-26 DIAGNOSIS — H02401 Unspecified ptosis of right eyelid: Secondary | ICD-10-CM | POA: Diagnosis not present

## 2021-09-27 DIAGNOSIS — Z79899 Other long term (current) drug therapy: Secondary | ICD-10-CM | POA: Diagnosis not present

## 2021-09-27 DIAGNOSIS — H16001 Unspecified corneal ulcer, right eye: Secondary | ICD-10-CM | POA: Diagnosis not present

## 2021-09-27 DIAGNOSIS — Z9889 Other specified postprocedural states: Secondary | ICD-10-CM | POA: Diagnosis not present

## 2021-09-27 DIAGNOSIS — H20051 Hypopyon, right eye: Secondary | ICD-10-CM | POA: Diagnosis not present

## 2021-09-27 DIAGNOSIS — H40051 Ocular hypertension, right eye: Secondary | ICD-10-CM | POA: Diagnosis not present

## 2021-09-30 DIAGNOSIS — H40051 Ocular hypertension, right eye: Secondary | ICD-10-CM | POA: Diagnosis not present

## 2021-09-30 DIAGNOSIS — H2101 Hyphema, right eye: Secondary | ICD-10-CM | POA: Diagnosis not present

## 2021-09-30 DIAGNOSIS — H16001 Unspecified corneal ulcer, right eye: Secondary | ICD-10-CM | POA: Diagnosis not present

## 2021-09-30 DIAGNOSIS — H20051 Hypopyon, right eye: Secondary | ICD-10-CM | POA: Diagnosis not present

## 2021-10-04 DIAGNOSIS — H4041X0 Glaucoma secondary to eye inflammation, right eye, stage unspecified: Secondary | ICD-10-CM | POA: Diagnosis not present

## 2021-10-04 DIAGNOSIS — H16001 Unspecified corneal ulcer, right eye: Secondary | ICD-10-CM | POA: Diagnosis not present

## 2021-10-04 DIAGNOSIS — H16031 Corneal ulcer with hypopyon, right eye: Secondary | ICD-10-CM | POA: Diagnosis not present

## 2021-10-04 DIAGNOSIS — H2101 Hyphema, right eye: Secondary | ICD-10-CM | POA: Diagnosis not present

## 2021-10-04 DIAGNOSIS — Z79899 Other long term (current) drug therapy: Secondary | ICD-10-CM | POA: Diagnosis not present

## 2021-10-04 DIAGNOSIS — H40051 Ocular hypertension, right eye: Secondary | ICD-10-CM | POA: Diagnosis not present

## 2021-10-04 DIAGNOSIS — H18821 Corneal disorder due to contact lens, right eye: Secondary | ICD-10-CM | POA: Diagnosis not present

## 2021-10-04 DIAGNOSIS — H20051 Hypopyon, right eye: Secondary | ICD-10-CM | POA: Diagnosis not present

## 2021-10-07 DIAGNOSIS — H40051 Ocular hypertension, right eye: Secondary | ICD-10-CM | POA: Diagnosis not present

## 2021-10-07 DIAGNOSIS — Z48810 Encounter for surgical aftercare following surgery on the sense organs: Secondary | ICD-10-CM | POA: Diagnosis not present

## 2021-10-07 DIAGNOSIS — H16031 Corneal ulcer with hypopyon, right eye: Secondary | ICD-10-CM | POA: Diagnosis not present

## 2021-10-07 DIAGNOSIS — Z9889 Other specified postprocedural states: Secondary | ICD-10-CM | POA: Diagnosis not present

## 2021-10-07 DIAGNOSIS — Z83518 Family history of other specified eye disorder: Secondary | ICD-10-CM | POA: Diagnosis not present

## 2021-10-11 DIAGNOSIS — H2511 Age-related nuclear cataract, right eye: Secondary | ICD-10-CM | POA: Diagnosis not present

## 2021-10-11 DIAGNOSIS — Z48810 Encounter for surgical aftercare following surgery on the sense organs: Secondary | ICD-10-CM | POA: Diagnosis not present

## 2021-10-11 DIAGNOSIS — Z79899 Other long term (current) drug therapy: Secondary | ICD-10-CM | POA: Diagnosis not present

## 2021-10-11 DIAGNOSIS — Z961 Presence of intraocular lens: Secondary | ICD-10-CM | POA: Diagnosis not present

## 2021-10-11 DIAGNOSIS — H16001 Unspecified corneal ulcer, right eye: Secondary | ICD-10-CM | POA: Diagnosis not present

## 2021-10-11 DIAGNOSIS — Z7952 Long term (current) use of systemic steroids: Secondary | ICD-10-CM | POA: Diagnosis not present

## 2021-10-11 DIAGNOSIS — Z9842 Cataract extraction status, left eye: Secondary | ICD-10-CM | POA: Diagnosis not present

## 2021-10-11 DIAGNOSIS — H40051 Ocular hypertension, right eye: Secondary | ICD-10-CM | POA: Diagnosis not present

## 2021-10-11 DIAGNOSIS — H02403 Unspecified ptosis of bilateral eyelids: Secondary | ICD-10-CM | POA: Diagnosis not present

## 2021-10-14 ENCOUNTER — Other Ambulatory Visit: Payer: Self-pay | Admitting: Internal Medicine

## 2021-10-21 DIAGNOSIS — Z48810 Encounter for surgical aftercare following surgery on the sense organs: Secondary | ICD-10-CM | POA: Diagnosis not present

## 2021-10-21 DIAGNOSIS — H16001 Unspecified corneal ulcer, right eye: Secondary | ICD-10-CM | POA: Diagnosis not present

## 2021-10-21 DIAGNOSIS — Z7952 Long term (current) use of systemic steroids: Secondary | ICD-10-CM | POA: Diagnosis not present

## 2021-10-21 DIAGNOSIS — H43391 Other vitreous opacities, right eye: Secondary | ICD-10-CM | POA: Diagnosis not present

## 2021-10-21 DIAGNOSIS — Z79899 Other long term (current) drug therapy: Secondary | ICD-10-CM | POA: Diagnosis not present

## 2021-10-21 DIAGNOSIS — H43811 Vitreous degeneration, right eye: Secondary | ICD-10-CM | POA: Diagnosis not present

## 2021-10-21 DIAGNOSIS — H18821 Corneal disorder due to contact lens, right eye: Secondary | ICD-10-CM | POA: Diagnosis not present

## 2021-10-21 DIAGNOSIS — H4311 Vitreous hemorrhage, right eye: Secondary | ICD-10-CM | POA: Diagnosis not present

## 2021-10-21 DIAGNOSIS — B9561 Methicillin susceptible Staphylococcus aureus infection as the cause of diseases classified elsewhere: Secondary | ICD-10-CM | POA: Diagnosis not present

## 2021-10-21 DIAGNOSIS — H40051 Ocular hypertension, right eye: Secondary | ICD-10-CM | POA: Diagnosis not present

## 2021-10-29 ENCOUNTER — Other Ambulatory Visit: Payer: Self-pay

## 2021-10-29 ENCOUNTER — Encounter: Payer: Self-pay | Admitting: Dermatology

## 2021-10-29 ENCOUNTER — Ambulatory Visit: Payer: Medicare Other | Admitting: Dermatology

## 2021-10-29 DIAGNOSIS — Z85820 Personal history of malignant melanoma of skin: Secondary | ICD-10-CM | POA: Diagnosis not present

## 2021-10-29 DIAGNOSIS — Z1283 Encounter for screening for malignant neoplasm of skin: Secondary | ICD-10-CM

## 2021-10-29 DIAGNOSIS — L821 Other seborrheic keratosis: Secondary | ICD-10-CM | POA: Diagnosis not present

## 2021-10-29 DIAGNOSIS — L570 Actinic keratosis: Secondary | ICD-10-CM

## 2021-11-04 DIAGNOSIS — R052 Subacute cough: Secondary | ICD-10-CM | POA: Diagnosis not present

## 2021-11-04 DIAGNOSIS — J3089 Other allergic rhinitis: Secondary | ICD-10-CM | POA: Diagnosis not present

## 2021-11-04 DIAGNOSIS — J301 Allergic rhinitis due to pollen: Secondary | ICD-10-CM | POA: Diagnosis not present

## 2021-11-04 DIAGNOSIS — H1045 Other chronic allergic conjunctivitis: Secondary | ICD-10-CM | POA: Diagnosis not present

## 2021-11-07 DIAGNOSIS — Z48810 Encounter for surgical aftercare following surgery on the sense organs: Secondary | ICD-10-CM | POA: Diagnosis not present

## 2021-11-07 DIAGNOSIS — B9561 Methicillin susceptible Staphylococcus aureus infection as the cause of diseases classified elsewhere: Secondary | ICD-10-CM | POA: Diagnosis not present

## 2021-11-07 DIAGNOSIS — Z7952 Long term (current) use of systemic steroids: Secondary | ICD-10-CM | POA: Diagnosis not present

## 2021-11-07 DIAGNOSIS — H4041X0 Glaucoma secondary to eye inflammation, right eye, stage unspecified: Secondary | ICD-10-CM | POA: Diagnosis not present

## 2021-11-07 DIAGNOSIS — H16001 Unspecified corneal ulcer, right eye: Secondary | ICD-10-CM | POA: Diagnosis not present

## 2021-11-07 DIAGNOSIS — Z79899 Other long term (current) drug therapy: Secondary | ICD-10-CM | POA: Diagnosis not present

## 2021-11-07 DIAGNOSIS — Z8669 Personal history of other diseases of the nervous system and sense organs: Secondary | ICD-10-CM | POA: Diagnosis not present

## 2021-11-20 IMAGING — CT CT CHEST HIGH RESOLUTION W/O CM
2 of 8 series · 14 of 36 positions shown, 17 images · non-contrast
Comparison: None.

CLINICAL DATA: Dyspnea on exertion, former smoker, emphysema

EXAM:
CT CHEST WITHOUT CONTRAST
TECHNIQUE: Multidetector CT imaging of the chest was performed following the
standard protocol without intravenous contrast. High resolution
imaging of the lungs, as well as inspiratory and expiratory imaging,
was performed.

[Series 5: high resolution · axial · 0.74mm/px · z∈[-348,-68]mm · 11 of 336 slices shown, 14 images]
[im 28/336  mediastinal]
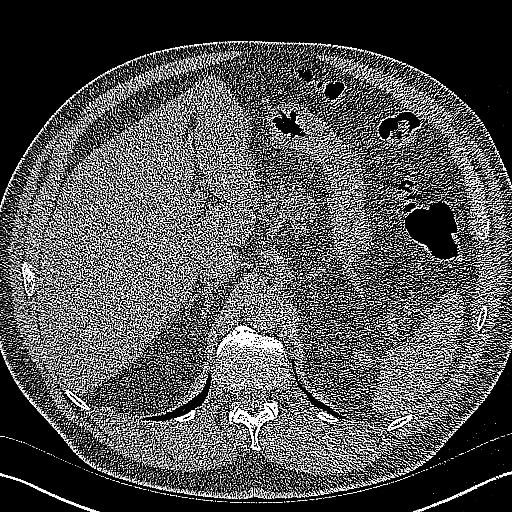
[im 28/336  lung]
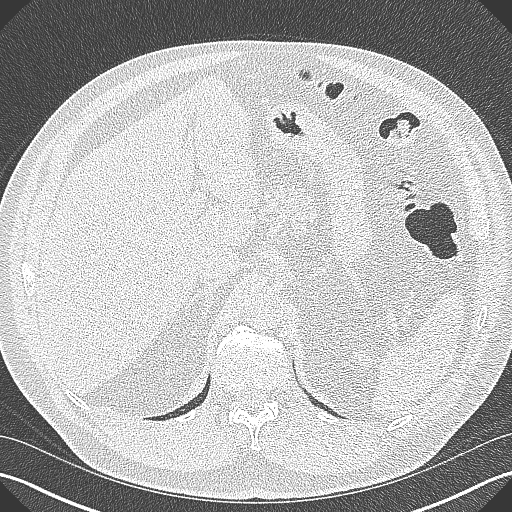
[im 56/336  lung]
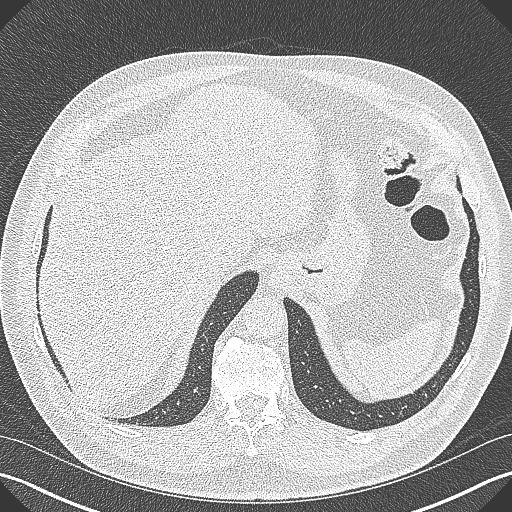
[im 84/336  lung]
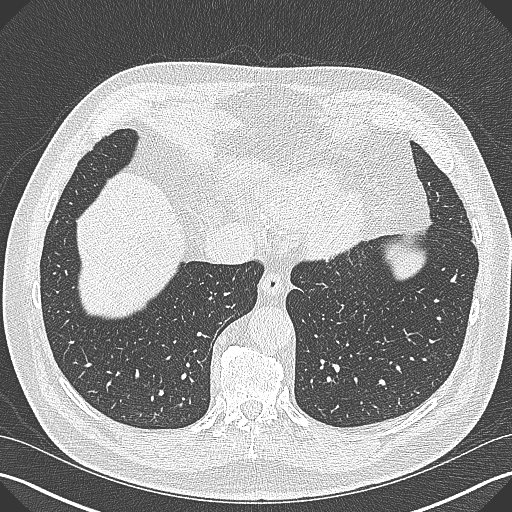
[im 112/336  lung]
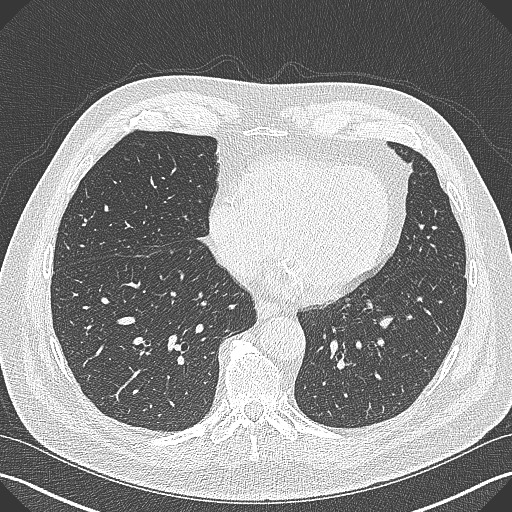
[im 140/336  mediastinal]
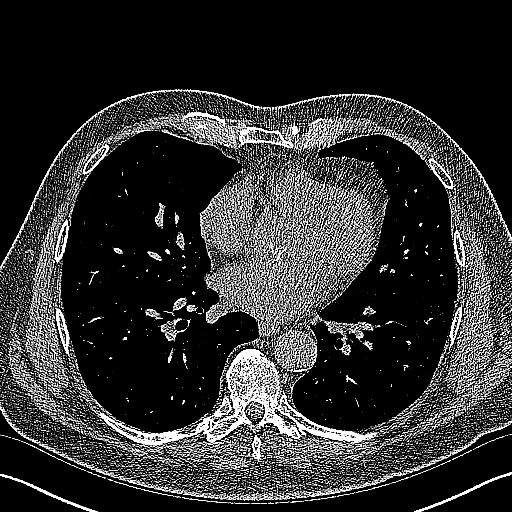
[im 140/336  lung]
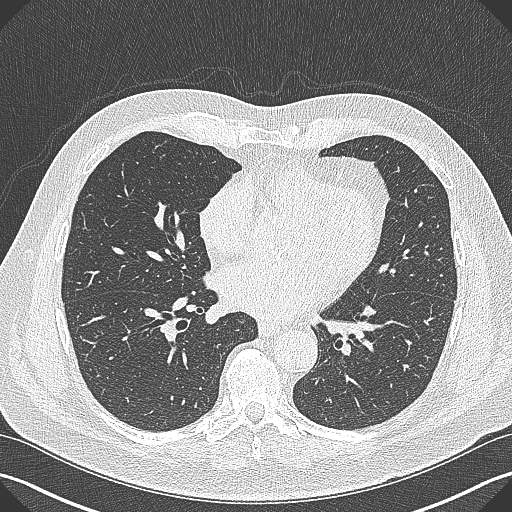
[im 168/336  lung]
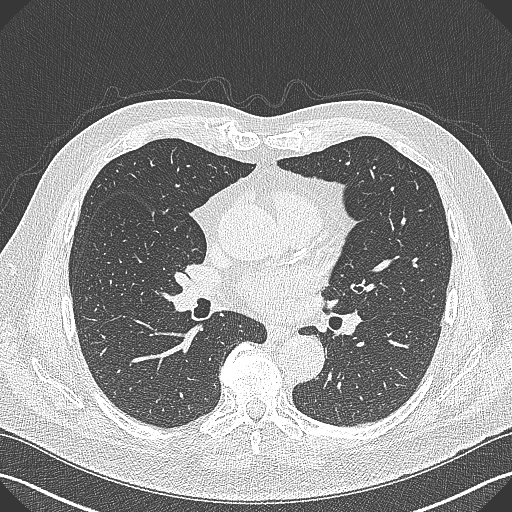
[im 196/336  lung]
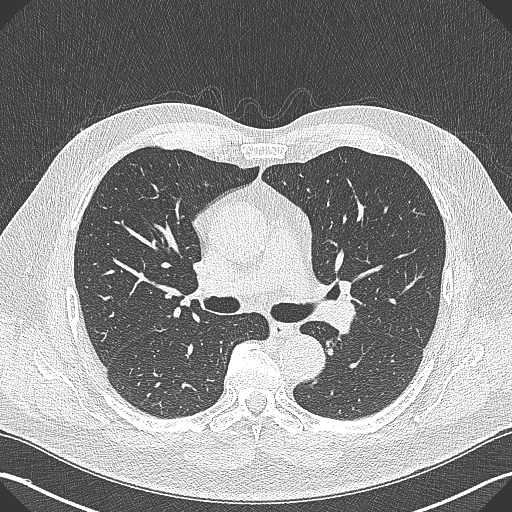
[im 224/336  lung]
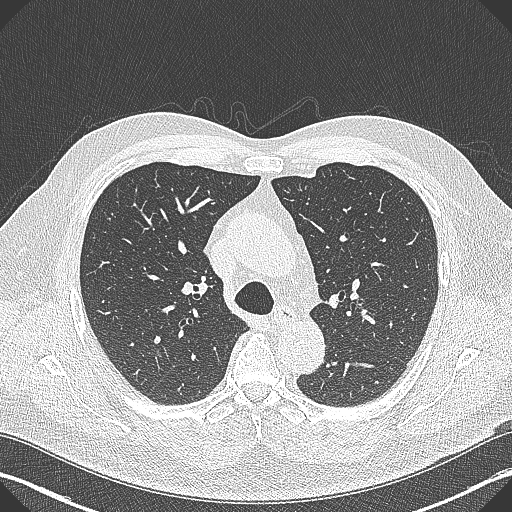
[im 252/336  mediastinal]
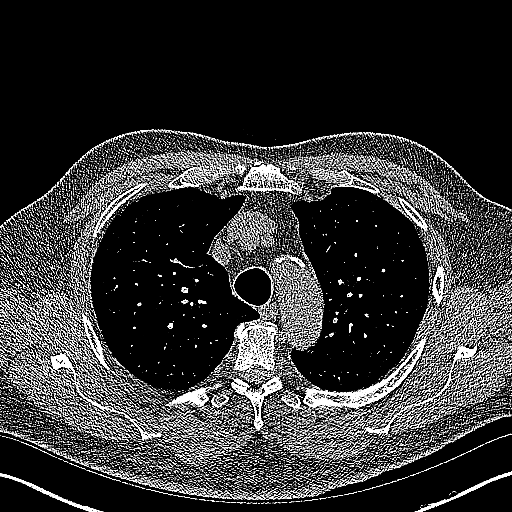
[im 252/336  lung]
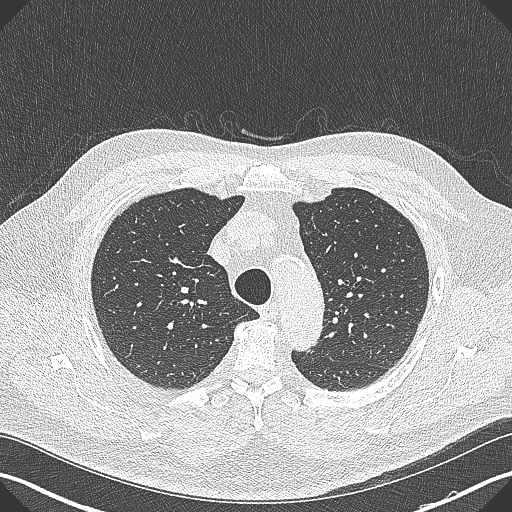
[im 280/336  lung]
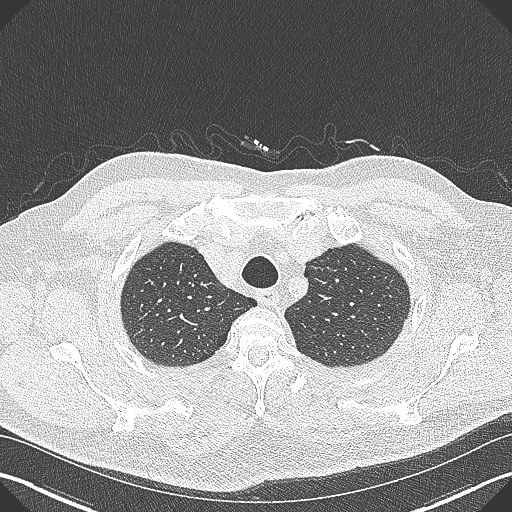
[im 308/336  lung]
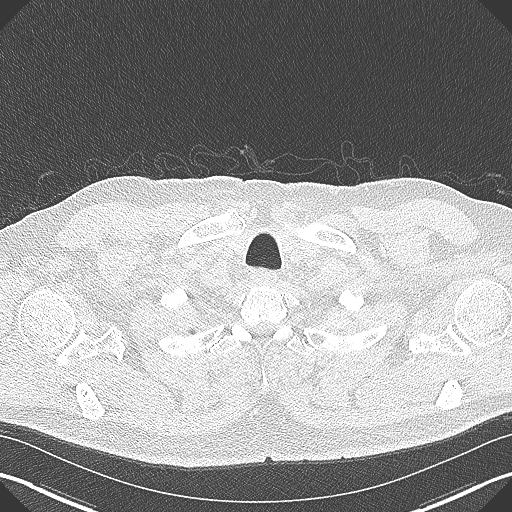

[Series 8: coronal · coronal · 0.70mm/px · 3 of 134 slices shown]
[im 27/134  lung]
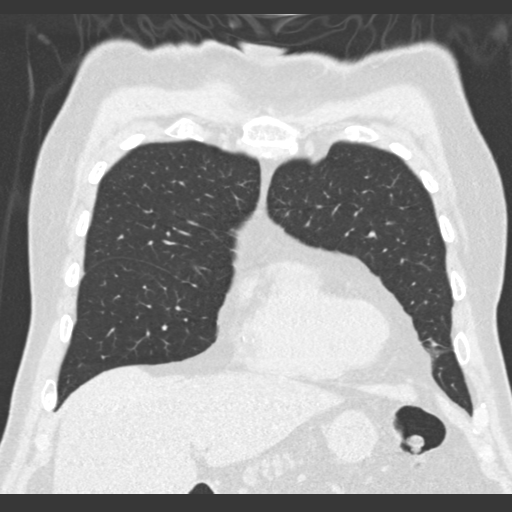
[im 54/134  lung]
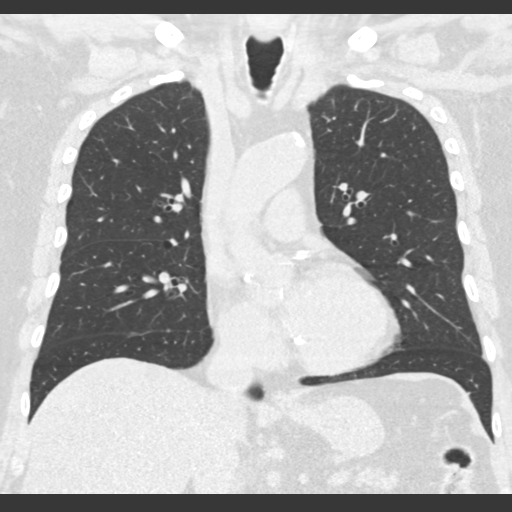
[im 80/134  lung]
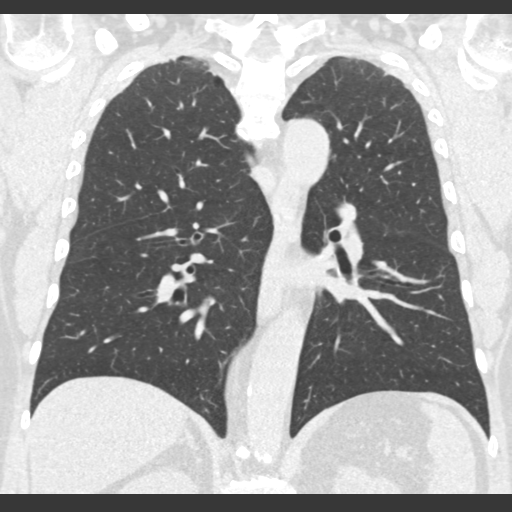

[14 of 36 positions shown; findings below may reference images not displayed]

FINDINGS: Cardiovascular: Aortic atherosclerosis. Normal heart size.
Three-vessel coronary artery calcifications and/or stents. No
pericardial effusion.

Mediastinum/Nodes: No enlarged mediastinal, hilar, or axillary lymph
nodes. Thyroid gland, trachea, and esophagus demonstrate no
significant findings.

Lungs/Pleura: Minimal paraseptal emphysema. 0.4 cm nodule of the
posterior left apex (series 5, image 70). Additional foot 0.4 cm
nodule of the central left upper lobe (series 5, image 87). No
pleural effusion or pneumothorax.

Upper Abdomen: No acute abnormality.

Musculoskeletal: No chest wall mass or suspicious bone lesions
identified.
IMPRESSION: 1. No evidence of fibrotic interstitial lung disease.
2. Minimal emphysema.
3. Small pulmonary nodules measuring up to 0.4 cm. No follow-up
needed if patient is low-risk (and has no known or suspected primary
neoplasm). Non-contrast chest CT can be considered in 12 months if
patient is high-risk. This recommendation follows the consensus
statement: Guidelines for Management of Incidental Pulmonary Nodules
Detected on CT Images: From the [HOSPITAL] 7834; Radiology
7834; [DATE].
4. Coronary artery disease.

Aortic Atherosclerosis (5O3W4-PVW.W) and Emphysema (5O3W4-AAF.B).

## 2021-11-22 ENCOUNTER — Encounter: Payer: Self-pay | Admitting: Dermatology

## 2021-11-22 NOTE — Progress Notes (Signed)
? ?  Follow-Up Visit ?  ?Subjective  ?Daniel Hampton is a 81 y.o. male who presents for the following: Annual Exam (Here for full body exam. No concerns. Personal history of melanoma and SCC. ). ? ?General skin check, history of melanoma and nonmelanoma skin cancer. ?Location:  ?Duration:  ?Quality:  ?Associated Signs/Symptoms: ?Modifying Factors:  ?Severity:  ?Timing: ?Context:  ? ?Objective  ?Well appearing patient in no apparent distress; mood and affect are within normal limits. ?Full body exam: No atypical pigmented lesions or nonmelanoma skin cancer ? ?Left Upper Arm ?White scar- clear, no regional adenopathy. ? ?Mid Back ?Brown flattopped 6 mm textured papule ? ?Left Zygomatic Area ?Flesh-colored 3 mm papule adjacent to gritty pink scale ? ? ? ? ? ? ? ? ?A full examination was performed including scalp, head, eyes, ears, nose, lips, neck, chest, axillae, abdomen, back, buttocks, bilateral upper extremities, bilateral lower extremities, hands, feet, fingers, toes, fingernails, and toenails. All findings within normal limits unless otherwise noted below. ? ? ?Assessment & Plan  ? ? ?Personal history of malignant melanoma of skin ?Left Upper Arm ? ?6 month skin examination  ? ?Encounter for screening for malignant neoplasm of skin ? ?Annual skin examination, encouraged to self examine twice annually. ? ?Seborrheic keratosis ?Mid Back ? ?Leave if stable ? ?Actinic keratosis ?Left Zygomatic Area ? ?Bump may only be a week old, recheck 6 months and biopsy if persists or grows. ? ? ? ? ? ?I, Lavonna Monarch, MD, have reviewed all documentation for this visit.  The documentation on 11/22/21 for the exam, diagnosis, procedures, and orders are all accurate and complete. ?

## 2021-12-06 DIAGNOSIS — H40051 Ocular hypertension, right eye: Secondary | ICD-10-CM | POA: Diagnosis not present

## 2021-12-06 DIAGNOSIS — H16001 Unspecified corneal ulcer, right eye: Secondary | ICD-10-CM | POA: Diagnosis not present

## 2021-12-06 DIAGNOSIS — Z79899 Other long term (current) drug therapy: Secondary | ICD-10-CM | POA: Diagnosis not present

## 2021-12-13 DIAGNOSIS — H209 Unspecified iridocyclitis: Secondary | ICD-10-CM | POA: Diagnosis not present

## 2021-12-13 DIAGNOSIS — H16001 Unspecified corneal ulcer, right eye: Secondary | ICD-10-CM | POA: Diagnosis not present

## 2021-12-13 DIAGNOSIS — Z961 Presence of intraocular lens: Secondary | ICD-10-CM | POA: Diagnosis not present

## 2021-12-13 DIAGNOSIS — H40051 Ocular hypertension, right eye: Secondary | ICD-10-CM | POA: Diagnosis not present

## 2021-12-13 DIAGNOSIS — H02403 Unspecified ptosis of bilateral eyelids: Secondary | ICD-10-CM | POA: Diagnosis not present

## 2021-12-13 DIAGNOSIS — H2511 Age-related nuclear cataract, right eye: Secondary | ICD-10-CM | POA: Diagnosis not present

## 2021-12-13 DIAGNOSIS — H4051X0 Glaucoma secondary to other eye disorders, right eye, stage unspecified: Secondary | ICD-10-CM | POA: Diagnosis not present

## 2021-12-23 ENCOUNTER — Ambulatory Visit (INDEPENDENT_AMBULATORY_CARE_PROVIDER_SITE_OTHER)
Admission: RE | Admit: 2021-12-23 | Discharge: 2021-12-23 | Disposition: A | Discharge status: Home / Self Care | Payer: Self-pay | Source: Ambulatory Visit | Attending: Internal Medicine | Admitting: Internal Medicine

## 2021-12-23 DIAGNOSIS — Z136 Encounter for screening for cardiovascular disorders: Secondary | ICD-10-CM

## 2021-12-26 ENCOUNTER — Other Ambulatory Visit: Payer: Self-pay | Admitting: Internal Medicine

## 2021-12-26 ENCOUNTER — Telehealth: Payer: Self-pay

## 2021-12-26 DIAGNOSIS — R931 Abnormal findings on diagnostic imaging of heart and coronary circulation: Secondary | ICD-10-CM | POA: Insufficient documentation

## 2021-12-26 DIAGNOSIS — E782 Mixed hyperlipidemia: Secondary | ICD-10-CM

## 2021-12-26 MED ORDER — ROSUVASTATIN CALCIUM 40 MG PO TABS: 40.0000 mg | ORAL_TABLET | Freq: Every day | ORAL | 3 refills | Status: DC

## 2021-12-26 NOTE — Telephone Encounter (Signed)
Pt was calling with questions regarding the CT scoring test.  I was able to advise the pt of Dr. Quay Burow recommendations per result notes his CT scan showed that he does have significant calcium/plaque in his heart arteries.  I would like to change his cholesterol medication to something little bit stronger-simvastatin 40 mg changed to Crestor 40 mg if he is okay with that.  He will need to come to the lab in 6 weeks and have blood work done so we could recheck his cholesterol.  I would also like him to see a cardiologist.  I have ordered a referral.  He has an enlarged aorta and that needs to be monitored annually.  He does have a small nodule in his lung that has been stable over the last 7 months and is likely benign.  We can monitor this as we monitor the aorta.   New cholesterol medication sent to pharmacy.  Needs blood work in about 6 weeks fasting.  Referral order for cardiology.  Pt has an appt with Cardiology on 01/01/22 and it was willing to pick up the Crestor and we scheduled him to come back for labs in 6 weeks.  After the results note was discussed pt no longer had any questions.  FYI

## 2022-01-01 ENCOUNTER — Ambulatory Visit: Payer: Medicare Other | Admitting: Cardiovascular Disease

## 2022-01-06 ENCOUNTER — Ambulatory Visit: Payer: Medicare Other | Admitting: Cardiology

## 2022-01-06 ENCOUNTER — Encounter: Payer: Self-pay | Admitting: Cardiology

## 2022-01-06 VITALS — BP 134/74 | HR 59 | Ht 70.0 in | Wt 228.4 lb

## 2022-01-06 DIAGNOSIS — R6889 Other general symptoms and signs: Secondary | ICD-10-CM | POA: Diagnosis not present

## 2022-01-06 DIAGNOSIS — E782 Mixed hyperlipidemia: Secondary | ICD-10-CM

## 2022-01-06 DIAGNOSIS — I700 Atherosclerosis of aorta: Secondary | ICD-10-CM

## 2022-01-06 DIAGNOSIS — R931 Abnormal findings on diagnostic imaging of heart and coronary circulation: Secondary | ICD-10-CM | POA: Diagnosis not present

## 2022-01-06 MED ORDER — METOPROLOL TARTRATE 25 MG PO TABS: 25.0000 mg | ORAL_TABLET | Freq: Once | ORAL | 0 refills | Status: DC

## 2022-01-06 NOTE — Patient Instructions (Addendum)
Medication Instructions:   See instruction below  *If you need a refill on your cardiac medications before your next appointment, please call your pharmacy*   Lab Work:  See instruction below Metoprolol   If you have labs (blood work) drawn today and your tests are completely normal, you will receive your results only by: [place] (if you have MyChart) OR A paper copy in the mail If you have any lab test that is abnormal or we need to change your treatment, we will call you to review the results.   Testing/Procedures: Your physician has requested that you have cardiac CTA. Cardiac computed tomography (CT) is a painless test that uses an x-ray machine to take clear, detailed pictures of your heart. Please follow instruction sheet as given.     Follow-Up: At [facility], you and your health needs are our priority.  As part of our continuing mission to provide you with exceptional heart care, we have created designated Provider Care Teams.  These Care Teams include your primary Cardiologist (physician) and Advanced Practice Providers (APPs -  Physician Assistants and Nurse Practitioners) who all work together to provide you with the care you need, when you need it.     Your next appointment:   2 month(s)  The format for your next appointment:   In Person  Provider:   Glenetta Hew, MD    Other Instructions    Your cardiac CT will be scheduled at the below location:   [facility] [address] [place], [place] [address] [phone]    If scheduled at [facility], please arrive at the [facility]'s Entrance (Entrance C2) of [facility] 30 minutes prior to test start time. You can use the FREE valet parking offered at entrance C (encouraged to control the heart rate for the test)  Proceed to the [facility] Radiology Department (first floor) to check-in and test prep.  All radiology patients and guests should use entrance  C2 at [facility], accessed from [facility], even though the hospital's physical address listed is [address]    If scheduled at [facility], please arrive 15 mins early for check-in and test prep.  Please follow these instructions carefully (unless otherwise directed):  Please have lab ( BMP ) at one week prior to testing  On the Night Before the Test: Be sure to Drink plenty of water. Do not consume any caffeinated/decaffeinated beverages or chocolate 12 hours prior to your test. Do not take any antihistamines 12 hours prior to your test.   On the Day of the Test: Drink plenty of water until 1 hour prior to the test. Do not eat any food 4 hours prior to the test. You may take your regular medications prior to the test.  Take metoprolol (Lopressor)  25 mg two hours prior to test.        After the Test: Drink plenty of water. After receiving IV contrast, you may experience a mild flushed feeling. This is normal. On occasion, you may experience a mild rash up to 24 hours after the test. This is not dangerous. If this occurs, you can take Benadryl 25 mg and increase your fluid intake. If you experience trouble breathing, this can be serious. If it is severe call 911 IMMEDIATELY. If it is mild, please call our office.   We will call to schedule your test 2-4 weeks out understanding that some insurance companies  will need an authorization prior to the service being performed.   For non-scheduling related questions, please contact the cardiac imaging nurse navigator should you have any questions/concerns: Marchia Bond, Cardiac Imaging Nurse Navigator Gordy Clement, Cardiac Imaging Nurse Navigator [facility] Direct Office Dial: [phone]   For scheduling needs, including cancellations and rescheduling, please call [place], [phone].

## 2022-01-06 NOTE — Progress Notes (Unsigned)
Primary Care Provider: [name], MD Cardiologist: None Electrophysiologist: None  Clinic Note: No chief complaint on file.  ===================================  ASSESSMENT/PLAN   Problem List Items Addressed This Visit     Exercise intolerance (Chronic)    ===================================  HPI:    Daniel Hampton is a 81 y.o. male with a PMH below who presents today for ***. Daniel Hampton is a 81 y.o. male who is being seen today for the evaluation of *** at the request of Burns, Claudina Lick, MD.  Daniel Hampton was last seen on *** . Recent Hospitalizations: ***  Reviewed  CV studies:    The following studies were reviewed today: (if available, images/films reviewed: From Epic Chart or Care Everywhere) ***:   Interval History:   Daniel Hampton   CV Review of Symptoms (Summary) Cardiovascular ROS: {roscv:310661}  REVIEWED OF SYSTEMS   ROS  I have reviewed and (if needed) personally updated the patient's problem list, medications, allergies, past medical and surgical history, social and family history.   PAST MEDICAL HISTORY   Past Medical History:  Diagnosis Date   Allergy    SEASONAL   Arthritis    Asthma    COLONIC POLYPS, HX OF 03/12/2007   [place] DISEASE, LUMBAR 12/03/2008   DIVERTICULOSIS, COLON 10/17/2009   History of kidney stones    Hydrocele    HYPERLIPIDEMIA 03/12/2007   Melanoma ([place]) 04/23/2020   in situ-Left Upper Arm   MORTON'S NEUROMA, RIGHT 03/13/2008   SCC (squamous cell carcinoma) 04/26/2013   right hand-txpbx   SKIN CANCER, HX OF 10/17/2009   VERTIGO, BENIGN PAROXYSMAL POSITION 04/28/2007    PAST SURGICAL HISTORY   Past Surgical History:  Procedure Laterality Date   BACK SURGERY     COLONOSCOPY W/ BIOPSIES     EYE SURGERY Left    LUMBAR LAMINECTOMY/ DECOMPRESSION WITH MET-RX Left 03/01/2021   Procedure: LEFT LUMBAR FOUR-FIVE MINIMALLY INVASIVE LAMINECTOMY, LATERAL RECESS DECOMPRESSION AND RESECTION OF SYNOVIAL CYST;   Surgeon: Karsten Ro, DO;  Location: [facility];  Service: Neurosurgery;  Laterality: Left;   MOHS SURGERY     SPINE SURGERY     lumbar disk surgery    Immunization History  Administered Date(s) Administered   Fluad Quad(high Dose 65+) 05/10/2019, 06/01/2021   Influenza Split 04/14/2012, 05/31/2013   Influenza Whole 08/04/2005, 07/19/2007   Influenza, High Dose Seasonal PF 06/05/2015, 07/01/2016, 05/12/2018, 05/23/2020   Influenza,inj,Quad PF,6+ Mos 06/06/2017   Influenza-Unspecified 05/04/2014, 06/05/2015   PFIZER(Purple Top)SARS-COV-2 Vaccination 08/26/2019, 09/13/2019, 06/05/2020   Pneumococcal Conjugate-13 08/08/2014   Pneumococcal Polysaccharide-23 08/04/2004, 12/31/2010, 09/29/2019   Td 08/04/2006   Zoster, Live 04/04/2012    MEDICATIONS/ALLERGIES   Current Meds  Medication Sig   albuterol (VENTOLIN HFA) 108 (90 Base) MCG/ACT inhaler Inhale 1-2 puffs into the lungs every 6 (six) hours as needed.   cetirizine (ZYRTEC) 10 MG tablet Take 1 tablet (10 mg total) by mouth daily.   Coenzyme Q10 (COQ-10 PO) Take by mouth.   dorzolamide-timolol (COSOPT) 22.3-6.8 MG/ML ophthalmic solution SMARTSIG:In Eye(s)   famotidine (PEPCID) 20 MG tablet Take 20 mg by mouth 2 (two) times daily.   fluticasone (FLONASE) 50 MCG/ACT nasal spray Place 2 sprays into both nostrils daily as needed for allergies or rhinitis.   METRONIDAZOLE, TOPICAL, 0.75 % LOTN Apply to rash once a day.  Use for up to 8 weeks   Multiple Vitamins-Minerals (ALIVE MULTI-VITAMIN PO) Take by mouth.   Olopatadine HCl 0.2 % SOLN 1  drop into affected eye   prednisoLONE acetate (PRED FORTE) 1 % ophthalmic suspension SMARTSIG:In Eye(s)   rosuvastatin (CRESTOR) 40 MG tablet Take 1 tablet (40 mg total) by mouth daily.   tamsulosin (FLOMAX) 0.4 MG CAPS capsule Take 0.4 mg by mouth daily.    No Known Allergies  SOCIAL HISTORY/FAMILY HISTORY   Reviewed in Epic:  Pertinent findings:  Social History   Tobacco Use   Smoking  status: Former    Packs/day: 1.00    Years: 20.00    Pack years: 20.00    Types: Cigarettes    Quit date: 10/30/1978    Years since quitting: 43.2   Smokeless tobacco: Never  Vaping Use   Vaping Use: Never used  Substance Use Topics   Alcohol use: Yes    Comment: 1 burben and 1 beer    Drug use: No   Social History   Social History Narrative   Retired from [employer] at [employer]   Enjoys golfing   Three daughters   Married     Right Handed    Has 2 granddaughters 1 grandson     OBJCTIVE -PE, EKG, labs   Wt Readings from Last 3 Encounters:  01/06/22 228 lb 6.4 oz (103.6 kg)  08/13/21 236 lb (107 kg)  07/08/21 241 lb (109.3 kg)    Physical Exam: BP 134/74   Pulse (!) 59   Ht '5\' 10"'  (1.778 m)   Wt 228 lb 6.4 oz (103.6 kg)   SpO2 98%   BMI 32.77 kg/m  Physical Exam   Adult ECG Report  Rate: *** ;  Rhythm: {rhythm:17366};   Narrative Interpretation: ***  Recent Labs:  ***  Lab Results  Component Value Date   CHOL 173 08/13/2021   HDL 44.70 08/13/2021   LDLCALC 91 08/13/2021   LDLDIRECT 169.0 01/18/2020   TRIG 187.0 (H) 08/13/2021   CHOLHDL 4 08/13/2021   Lab Results  Component Value Date   CREATININE 0.98 08/13/2021   BUN 20 08/13/2021   NA 139 08/13/2021   K 4.2 08/13/2021   CL 102 08/13/2021   CO2 27 08/13/2021      Latest Ref Rng & Units 08/13/2021   10:50 AM 02/26/2021    8:53 AM 01/18/2020   11:08 AM  CBC  WBC 4.0 - 10.5 K/uL 9.8   7.3   7.9    Hemoglobin 13.0 - 17.0 g/dL 15.1   15.5   15.0    Hematocrit 39.0 - 52.0 % 45.0   45.2   42.7    Platelets 150.0 - 400.0 K/uL 224.0   205   204.0      Lab Results  Component Value Date   HGBA1C 6.1 08/13/2021   Lab Results  Component Value Date   TSH 2.80 01/18/2020    ==================================================  COVID-19 Education: The signs and symptoms of COVID-19 were discussed with the patient and how to seek care for testing (follow up with PCP or arrange E-visit).     I spent a total of ***minutes with the patient spent in direct patient consultation.  Additional time spent with chart review  / charting (studies, outside notes, etc): *** min Total Time: *** min  Current medicines are reviewed at length with the patient today.  (+/- concerns) ***  This visit occurred during the SARS-CoV-2 public health emergency.  Safety protocols were in place, including screening questions prior to the visit, additional usage of staff PPE, and extensive cleaning of  exam room while observing appropriate contact time as indicated for disinfecting solutions.  Notice: This dictation was prepared with Dragon dictation along with smart phrase technology. Any transcriptional errors that result from this process are unintentional and may not be corrected upon review.  Studies Ordered:   No orders of the defined types were placed in this encounter.  No orders of the defined types were placed in this encounter.   Patient Instructions / Medication Changes & Studies & Tests Ordered   There are no Patient Instructions on file for this visit.     Glenetta Hew, M.D., M.S. Interventional Cardiologist   Pager # [phone] Phone # [phone] [address] [place], [place] [address]   Thank you for choosing Heartcare at [facility]!!

## 2022-01-09 ENCOUNTER — Encounter: Payer: Self-pay | Admitting: Cardiology

## 2022-01-09 NOTE — Assessment & Plan Note (Signed)
With elevated Coronary Calcium Score we have already Done his respect modification with changing to rosuvastatin, continue to heart healthy diet and regular exercise. Started aspirin 81 mg per PCP.

## 2022-01-09 NOTE — Assessment & Plan Note (Signed)
Coronary Calcium Score is quite elevated at 1685. Thankfully, he is not really having any active symptoms of angina or significant exertional dyspnea.  He is, however noted that he is following elevated had to stop twice in his walk yesterday which did not just have to do.  He is concerned with his eye issues giving him more deconditioned that he STEMI to get back into exercise, and does not want to have issues with getting back and exercise.  We had a long discussion as to the appropriate management of the situation.  He is very interested in finding out more about his coronary anatomy.  I explained to in detail about the pathophysiology of coronary artery disease in both the anatomy and physiology angina etc.  After long discussion, we agreed that with his now new exertional dyspnea and desire to get back and exercise and ischemic evaluation is warranted.  I talked with Dr. Eleonore Chiquito (who read the coronary) and he thought that the likely best option for evaluating her anatomy and physiology would be to start with a coronary CTA.  Only then will be able to see if they could check FFR ct if necessary.  With this amount of coronary calcium he would be concerned about possible false positives or negatives with Myoview.  Plan: Coronary CTA  Aspirin 81 mg daily  Continue rosuvastatin.  No beta-blocker because of sinus bradycardia.  Given his advanced age, would not be overly aggressive with blood pressure and therefore would not plan to add antihypertensive at this time.

## 2022-01-09 NOTE — Assessment & Plan Note (Signed)
Probably multifactorial with age, deconditioning but now with the finding of significant elevated Coronary Calcium Score involving 2 major arteries, not unreasonable to proceed with ischemic evaluation.  Plan: Coronary CT angiogram with FFR ct if indicated.

## 2022-01-09 NOTE — Assessment & Plan Note (Addendum)
Had been on simvastatin for lipids relatively well controlled, but now with Coronary Calcium Score 6085, I do agree with be more aggressive and switch to rosuvastatin.  Target LDL now should be less than 70 if not closer to 50.  Would probably be due for lipid panel follow-up in a few months after being on rosuvastatin.  Defer to PCP for timing based on timing of the switch to rosuvastatin.

## 2022-01-13 DIAGNOSIS — H4051X3 Glaucoma secondary to other eye disorders, right eye, severe stage: Secondary | ICD-10-CM | POA: Diagnosis not present

## 2022-01-13 DIAGNOSIS — Z79899 Other long term (current) drug therapy: Secondary | ICD-10-CM | POA: Diagnosis not present

## 2022-01-13 DIAGNOSIS — H209 Unspecified iridocyclitis: Secondary | ICD-10-CM | POA: Diagnosis not present

## 2022-01-13 DIAGNOSIS — H16001 Unspecified corneal ulcer, right eye: Secondary | ICD-10-CM | POA: Diagnosis not present

## 2022-01-13 DIAGNOSIS — H40051 Ocular hypertension, right eye: Secondary | ICD-10-CM | POA: Diagnosis not present

## 2022-01-16 ENCOUNTER — Telehealth: Payer: Self-pay | Admitting: Cardiology

## 2022-01-16 NOTE — Telephone Encounter (Signed)
Pt called questioning if he need lab work. Pt informed to have lab work done at least one week prior to Cardiac CTA.. Pt state he will be out of town next week so will come tomorrow.

## 2022-01-16 NOTE — Telephone Encounter (Signed)
Patient is wanting to confirm if and when he needs labs.

## 2022-01-17 DIAGNOSIS — R6889 Other general symptoms and signs: Secondary | ICD-10-CM | POA: Diagnosis not present

## 2022-01-17 LAB — BASIC METABOLIC PANEL
BUN/Creatinine Ratio: 18 (ref 10–24)
BUN: 15 mg/dL (ref 8–27)
CO2: 27 mmol/L (ref 20–29)
Calcium: 9.1 mg/dL (ref 8.6–10.2)
Chloride: 104 mmol/L (ref 96–106)
Creatinine, Ser: 0.84 mg/dL (ref 0.76–1.27)
Glucose: 99 mg/dL (ref 70–99)
Potassium: 4.6 mmol/L (ref 3.5–5.2)
Sodium: 141 mmol/L (ref 134–144)
eGFR: 87 mL/min/{1.73_m2} (ref >59)

## 2022-01-21 ENCOUNTER — Encounter: Payer: Self-pay | Admitting: Internal Medicine

## 2022-01-24 ENCOUNTER — Telehealth (HOSPITAL_COMMUNITY): Payer: Self-pay | Admitting: Emergency Medicine

## 2022-01-24 NOTE — Telephone Encounter (Signed)
Reaching out to patient to offer assistance regarding upcoming cardiac imaging study; pt verbalizes understanding of appt date/time, parking situation and where to check in, pre-test NPO status and medications ordered, and verified current allergies; name and call back number provided for further questions should they arise Cayne Yom RN Navigator Cardiac Imaging [facility] [phone] office [phone] cell 

## 2022-01-24 NOTE — Telephone Encounter (Signed)
Pt daughter called in for an update on the [employer].   I advised her of Dr. Lawerance Bach did reply via Earleen Reaper stating "would stay off of it for at least 2 weeks to see if that improves the pain.  It is always hard to know if the joint pain or muscle pain is related to the medication or something else.  The pain should improve if you are off the medication for a couple of weeks. Sometimes people can tolerate a lower dose but not the higher doses.  Of course you ideally need to be on the medication.  We first need to know if this is the cause of the pain or not.  If your pain does not improve after 2 weeks then I would consider restarting the medication.  We can always have your shoulder soreness evaluated by orthopedics-let me know."  Pt daughter had no further questions or concerns at this time. They will fu in the recommended time frame.  FYI

## 2022-01-27 ENCOUNTER — Ambulatory Visit (HOSPITAL_COMMUNITY)
Admission: RE | Admit: 2022-01-27 | Discharge: 2022-01-27 | Disposition: A | Discharge status: Home / Self Care | Payer: Medicare Other | Source: Ambulatory Visit | Attending: Cardiology | Admitting: Cardiology

## 2022-01-27 ENCOUNTER — Other Ambulatory Visit (HOSPITAL_COMMUNITY): Payer: Self-pay | Admitting: Emergency Medicine

## 2022-01-27 ENCOUNTER — Ambulatory Visit (HOSPITAL_BASED_OUTPATIENT_CLINIC_OR_DEPARTMENT_OTHER)
Admission: RE | Admit: 2022-01-27 | Discharge: 2022-01-27 | Disposition: A | Discharge status: Home / Self Care | Payer: Medicare Other | Source: Ambulatory Visit | Attending: Cardiology | Admitting: Cardiology

## 2022-01-27 DIAGNOSIS — I2510 Atherosclerotic heart disease of native coronary artery without angina pectoris: Secondary | ICD-10-CM | POA: Insufficient documentation

## 2022-01-27 DIAGNOSIS — I7121 Aneurysm of the ascending aorta, without rupture: Secondary | ICD-10-CM | POA: Diagnosis not present

## 2022-01-27 DIAGNOSIS — R931 Abnormal findings on diagnostic imaging of heart and coronary circulation: Secondary | ICD-10-CM

## 2022-01-27 DIAGNOSIS — R6889 Other general symptoms and signs: Secondary | ICD-10-CM | POA: Diagnosis not present

## 2022-01-27 DIAGNOSIS — J984 Other disorders of lung: Secondary | ICD-10-CM | POA: Diagnosis not present

## 2022-01-27 DIAGNOSIS — K7689 Other specified diseases of liver: Secondary | ICD-10-CM | POA: Insufficient documentation

## 2022-01-27 DIAGNOSIS — R079 Chest pain, unspecified: Secondary | ICD-10-CM | POA: Insufficient documentation

## 2022-01-27 HISTORY — PX: CT CTA CORONARY W/CA SCORE W/CM &/OR WO/CM: HXRAD787

## 2022-01-27 MED ORDER — NITROGLYCERIN 0.4 MG SL SUBL
SUBLINGUAL_TABLET | SUBLINGUAL | Status: AC
  Filled 2022-01-27: qty 2

## 2022-01-27 MED ORDER — IOHEXOL 350 MG/ML SOLN
100.0000 mL | Freq: Once | INTRAVENOUS | Status: AC | PRN
  Administered 2022-01-27: 100 mL via INTRAVENOUS

## 2022-01-27 MED ORDER — NITROGLYCERIN 0.4 MG SL SUBL
0.8000 mg | SUBLINGUAL_TABLET | Freq: Once | SUBLINGUAL | Status: AC
  Administered 2022-01-27: 0.8 mg via SUBLINGUAL

## 2022-01-28 DIAGNOSIS — I2510 Atherosclerotic heart disease of native coronary artery without angina pectoris: Secondary | ICD-10-CM | POA: Diagnosis not present

## 2022-01-28 DIAGNOSIS — R931 Abnormal findings on diagnostic imaging of heart and coronary circulation: Secondary | ICD-10-CM | POA: Diagnosis not present

## 2022-01-29 ENCOUNTER — Ambulatory Visit: Payer: Medicare Other | Admitting: Family Medicine

## 2022-01-29 ENCOUNTER — Encounter: Payer: Self-pay | Admitting: Family Medicine

## 2022-01-29 VITALS — BP 124/68 | HR 60 | Temp 97.8°F | Ht 70.0 in | Wt 234.0 lb

## 2022-01-29 DIAGNOSIS — R058 Other specified cough: Secondary | ICD-10-CM | POA: Diagnosis not present

## 2022-01-29 DIAGNOSIS — H938X2 Other specified disorders of left ear: Secondary | ICD-10-CM | POA: Insufficient documentation

## 2022-01-29 MED ORDER — AZITHROMYCIN 250 MG PO TABS: ORAL_TABLET | ORAL | 0 refills | Status: AC

## 2022-01-29 MED ORDER — BENZONATATE 200 MG PO CAPS: 200.0000 mg | ORAL_CAPSULE | Freq: Two times a day (BID) | ORAL | 0 refills | Status: DC | PRN

## 2022-01-29 NOTE — Assessment & Plan Note (Signed)
No acute distress. Z-pak and Tessalon prescribed. Recommend Mucinex and continue allergy medications and antacid. Follow up if worsening or not back to baseline in 10 days.

## 2022-01-29 NOTE — Patient Instructions (Signed)
Take the antibiotic as prescribed.   Try over the counter Mucinex (PLAIN)   Take your allergy medication and acid reflux medication.   I also prescribed Tessalon perles for cough.   Follow up if you are getting worse or not back to baseline in 10 days.

## 2022-01-29 NOTE — Assessment & Plan Note (Signed)
Ear exam normal

## 2022-01-29 NOTE — Progress Notes (Signed)
Subjective:     Patient ID: Daniel Hampton, male    DOB: [birth year], 81 y.o.   MRN: [id number]  Chief Complaint  Patient presents with   Cough    3 weeks ago, started in left ear and then moved to his chest. Now has a dry cough    HPI Patient is in today for a 3-4 week history of cough and chest congestion. States cough was preceded by left ear feeling clogged.  No fever, chills, body aches, headache, sinus pressure, sore throat, chest pain, palpitations, shortness of breath, abdominal pain, N/V/D, LE edema.   Taking cetirizine.  States he has acid reflux and knows his cough is not related to this.   States he was taking Crestor and having fatigue and body aches. Stopped the medication one week ago. States he sent his PCP a note about it.    Health Maintenance Due  Topic Date Due   COVID-19 Vaccine (4 - Booster for Pfizer series) 07/31/2020   COLONOSCOPY (Pts 45-47yr Insurance coverage will need to be confirmed)  01/30/2021    Past Medical History:  Diagnosis Date   Allergy    SEASONAL   Arthritis    Asthma    COLONIC POLYPS, HX OF 03/12/2007   [place]   DIVERTICULOSIS, COLON 10/17/2009   History of kidney stones    Hydrocele    HYPERLIPIDEMIA 03/12/2007   Hyphema of right eye    Complicated by allergic conjunctivitis   [place]an[place] 04/23/2020   in situ-Left Upper Arm   MORTON'S NEUROMA, RIGHT 03/13/2008   SCC (squamous cell carcinoma) 04/26/2013   right hand-txpbx   SKIN CANCER, HX OF 10/17/2009   VERTIGO, BENIGN PAROXYSMAL POSITION 04/28/2007    Past Surgical History:  Procedure Laterality Date   BACK SURGERY     COLONOSCOPY W/ BIOPSIES     EYE SURGERY Left    LUMBAR LAMINECTOMY/ DECOMPRESSION WITH MET-RX Left 03/01/2021   Procedure: LEFT LUMBAR FOUR-FIVE MINIMALLY INVASIVE LAMINECTOMY, LATERAL RECESS DECOMPRESSION AND RESECTION OF SYNOVIAL CYST;  Surgeon: DKarsten Ro DO;  [facility]  Service: Neurosurgery;  Laterality:  Left;   MOHS SURGERY     SPINE SURGERY     lumbar disk surgery    Family History  Problem Relation Age of Onset   Lung cancer Mother    Ovarian cancer Mother    COPD Father    Heart attack Father    Alcohol abuse Brother    Lung cancer Brother        smoker   Breast cancer Daughter    Breast cancer Daughter    Colon cancer Neg Hx    Esophageal cancer Neg Hx    Rectal cancer Neg Hx    Stomach cancer Neg Hx     Social History   Socioeconomic History   Marital status: Married    Spouse name: Not on file   Number of children: 3   Years of education: Not on file   Highest education level: Not on file  Occupational History   Occupation: Retired  Tobacco Use   Smoking status: Former    Packs/day: 1.00    Years: 20.00    Total pack years: 20.00    Types: Cigarettes    Quit date: 10/30/1978    Years since quitting: 43.2   Smokeless tobacco: Never  Vaping Use   Vaping Use: Never used  Substance and Sexual Activity   Alcohol use: Yes  Comment: 1 burben and 1 beer    Drug use: No   Sexual activity: Not on file  Other Topics Concern   Not on file  Social History Narrative   Retired from [employer] at [employer]   Enjoys golfing   Three daughters   Married     Right Handed    Has 2 granddaughters 1 grandson    Social Determinants of Health   Financial Resource Strain: Low Risk  (03/05/2020)   Overall Financial Resource Strain (CARDIA)    Difficulty of Paying Living Expenses: Not hard at all  Food Insecurity: No Food Insecurity (03/05/2020)   Hunger Vital Sign    Worried About Running Out of Food in the Last Year: Never true    [place] in the Last Year: Never true  Transportation Needs: No Transportation Needs (03/05/2020)   PRAPARE - Hydrologist (Medical): No    Lack of Transportation (Non-Medical): No  Physical Activity: Sufficiently Active (03/05/2020)   Exercise Vital Sign    Days of Exercise per Week: 5 days     Minutes of Exercise per Session: 60 min  Stress: No Stress Concern Present (03/05/2020)   [place]    Feeling of Stress : Not at all  Social Connections: Unknown (03/05/2020)   Social Connection and Isolation Panel [NHANES]    Frequency of Communication with Friends and Family: More than three times a week    Frequency of Social Gatherings with Friends and Family: Once a week    Attends Religious Services: Patient refused    Active Member of Clubs or Organizations: Yes    Attends Archivist Meetings: Patient refused    Marital Status: Married  Human resources officer Violence: Not At Risk (03/05/2020)   Humiliation, Afraid, Rape, and Kick questionnaire    Fear of Current or Ex-Partner: No    Emotionally Abused: No    Physically Abused: No    Sexually Abused: No    Outpatient Medications Prior to Visit  Medication Sig Dispense Refill   albuterol (VENTOLIN HFA) 108 (90 Base) MCG/ACT inhaler Inhale 1-2 puffs into the lungs every 6 (six) hours as needed. 8 g 2   aspirin EC 81 MG tablet Take 81 mg by mouth daily. Swallow whole.     cetirizine (ZYRTEC) 10 MG tablet Take 1 tablet (10 mg total) by mouth daily. 30 tablet 11   Coenzyme Q10 (COQ-10 PO) Take by mouth.     dorzolamide-timolol (COSOPT) 22.3-6.8 MG/ML ophthalmic solution SMARTSIG:In Eye(s)     famotidine (PEPCID) 20 MG tablet Take 20 mg by mouth 2 (two) times daily.     fluticasone (FLONASE) 50 MCG/ACT nasal spray Place 2 sprays into both nostrils daily as needed for allergies or rhinitis.     metoprolol tartrate (LOPRESSOR) 25 MG tablet Take 1 tablet (25 mg total) by mouth once for 1 dose. TAKE TWO HOURS PRIOR TO  SCHEDULE CARDIAC TEST 1 tablet 0   METRONIDAZOLE, TOPICAL, 0.75 % LOTN Apply to rash once a day.  Use for up to 8 weeks 59 mL 0   Multiple Vitamins-Minerals (ALIVE MULTI-VITAMIN PO) Take by mouth.     Olopatadine HCl 0.2 % SOLN 1 drop into affected eye      prednisoLONE acetate (PRED FORTE) 1 % ophthalmic suspension SMARTSIG:In Eye(s)     rosuvastatin (CRESTOR) 40 MG tablet Take 1 tablet (40 mg total) by mouth  daily. 90 tablet 3   tamsulosin (FLOMAX) 0.4 MG CAPS capsule Take 0.4 mg by mouth daily.     No facility-administered medications prior to visit.    Allergies  Allergen Reactions   Tape Rash    ROS     Objective:    Physical Exam Constitutional:      General: He is not in acute distress.    Appearance: He is not ill-appearing.  HENT:     Right Ear: Tympanic membrane and ear canal normal.     Left Ear: Tympanic membrane and ear canal normal.     Nose: No congestion.     Mouth/Throat:     Mouth: Mucous membranes are moist.  Eyes:     Conjunctiva/sclera: Conjunctivae normal.     Pupils: Pupils are equal, round, and reactive to light.  Cardiovascular:     Rate and Rhythm: Normal rate.  Pulmonary:     Effort: Pulmonary effort is normal.     Breath sounds: Normal breath sounds.  Musculoskeletal:     Cervical back: Normal range of motion and neck supple.  Lymphadenopathy:     Cervical: No cervical adenopathy.  Neurological:     Mental Status: He is alert.     BP 124/68 (BP Location: Left Arm, Patient Position: Sitting, Cuff Size: Large)   Pulse 60   Temp 97.8 F (36.6 C) (Temporal)   Ht _0  (1.778 m)   Wt 234 lb (106.1 kg)   SpO2 96%   BMI 33.58 kg/m  Wt Readings from Last 3 Encounters:  01/29/22 234 lb (106.1 kg)  01/06/22 228 lb 6.4 oz (103.6 kg)  08/13/21 236 lb (107 kg)       Assessment & Plan:   Problem List Items Addressed This Visit       Nervous and Auditory   Sensation of fullness in left ear    Ear exam normal         Other   Cough present for greater than 3 weeks - Primary    No acute distress. Z-pak and Tessalon prescribed. Recommend Mucinex and continue allergy medications and antacid. Follow up if worsening or not back to baseline in 10 days.       Relevant Medications    azithromycin (ZITHROMAX) 250 MG tablet   benzonatate (TESSALON) 200 MG capsule    I am having Harrol W. Heyer "Jaxon Enzor" start on azithromycin and benzonatate. I am also having him maintain his fluticasone, Multiple Vitamins-Minerals (ALIVE MULTI-VITAMIN PO), Coenzyme Q10 (COQ-10 PO), albuterol, famotidine, Olopatadine HCl, tamsulosin, cetirizine, METRONIDAZOLE (TOPICAL), rosuvastatin, prednisoLONE acetate, dorzolamide-timolol, metoprolol tartrate, and aspirin EC.  Meds ordered this encounter  Medications   azithromycin (ZITHROMAX) 250 MG tablet    Sig: Take 2 tablets on day 1, then 1 tablet daily on days 2 through 5    Dispense:  6 tablet    Refill:  0    Order Specific Question:   Supervising Provider    Answer:   Pricilla Holm A [[id number]]   benzonatate (TESSALON) 200 MG capsule    Sig: Take 1 capsule (200 mg total) by mouth 2 (two) times daily as needed for cough.    Dispense:  20 capsule    Refill:  0    Order Specific Question:   Supervising Provider    Answer:   Pricilla Holm A [[id number]]

## 2022-02-03 ENCOUNTER — Other Ambulatory Visit (INDEPENDENT_AMBULATORY_CARE_PROVIDER_SITE_OTHER): Payer: Medicare Other

## 2022-02-03 DIAGNOSIS — E782 Mixed hyperlipidemia: Secondary | ICD-10-CM | POA: Diagnosis not present

## 2022-02-03 LAB — HEPATIC FUNCTION PANEL
ALT: 34 U/L (ref 0–53)
AST: 31 U/L (ref 0–37)
Albumin: 4.3 g/dL (ref 3.5–5.2)
Alkaline Phosphatase: 52 U/L (ref 39–117)
Bilirubin, Direct: 0.2 mg/dL (ref 0.0–0.3)
Total Bilirubin: 0.6 mg/dL (ref 0.2–1.2)
Total Protein: 7.3 g/dL (ref 6.0–8.3)

## 2022-02-03 LAB — LIPID PANEL
Cholesterol: 143 mg/dL (ref 0–200)
HDL: 32.4 mg/dL — ABNORMAL LOW (ref >39.00)
NonHDL: 110.83
Total CHOL/HDL Ratio: 4
Triglycerides: 216 mg/dL — ABNORMAL HIGH (ref 0.0–149.0)
VLDL: 43.2 mg/dL — ABNORMAL HIGH (ref 0.0–40.0)

## 2022-02-03 LAB — LDL CHOLESTEROL, DIRECT: Direct LDL: 88 mg/dL

## 2022-02-25 DIAGNOSIS — H2101 Hyphema, right eye: Secondary | ICD-10-CM | POA: Diagnosis not present

## 2022-02-25 DIAGNOSIS — H40051 Ocular hypertension, right eye: Secondary | ICD-10-CM | POA: Diagnosis not present

## 2022-02-25 DIAGNOSIS — H16001 Unspecified corneal ulcer, right eye: Secondary | ICD-10-CM | POA: Diagnosis not present

## 2022-02-25 DIAGNOSIS — H209 Unspecified iridocyclitis: Secondary | ICD-10-CM | POA: Diagnosis not present

## 2022-02-25 DIAGNOSIS — H20051 Hypopyon, right eye: Secondary | ICD-10-CM | POA: Diagnosis not present

## 2022-02-25 DIAGNOSIS — Z79899 Other long term (current) drug therapy: Secondary | ICD-10-CM | POA: Diagnosis not present

## 2022-03-19 DIAGNOSIS — R972 Elevated prostate specific antigen [PSA]: Secondary | ICD-10-CM | POA: Diagnosis not present

## 2022-03-31 NOTE — Progress Notes (Unsigned)
 Primary Care Provider: Burns, Stacy J, MD Cardiologist:  , MD Electrophysiologist: None  Clinic Note: No chief complaint on file.   ===================================  ASSESSMENT/PLAN   Problem List Items Addressed This Visit   None   ===================================  HPI:    Perris W Schnelle is a 81 y.o. male with a PMH below who presents today for ***. Mayo W Flynt is a 81 y.o. male who is being seen today for the evaluation of *** at the request of Burns, Stacy J, MD.  Chasin W Minotti was last seen on ***  Recent Hospitalizations: ***  Reviewed  CV studies:    The following studies were reviewed today: (if available, images/films reviewed: From Epic Chart or Care Everywhere) ***:   Interval History:   Pelham W Shahin   CV Review of Symptoms (Summary) Cardiovascular ROS: {roscv:310661}  REVIEWED OF SYSTEMS   ROS  I have reviewed and (if needed) personally updated the patient's problem list, medications, allergies, past medical and surgical history, social and family history.   PAST MEDICAL HISTORY   Past Medical History:  Diagnosis Date   Allergy    SEASONAL   Arthritis    Asthma    COLONIC POLYPS, HX OF 03/12/2007   [place] DISEASE, LUMBAR 12/03/2008   DIVERTICULOSIS, COLON 10/17/2009   History of kidney stones    Hydrocele    HYPERLIPIDEMIA 03/12/2007   Hyphema of right eye    Complicated by allergic conjunctivitis   Melanoma (HCC) 04/23/2020   in situ-Left Upper Arm   MORTON'S NEUROMA, RIGHT 03/13/2008   SCC (squamous cell carcinoma) 04/26/2013   right hand-txpbx   SKIN CANCER, HX OF 10/17/2009   VERTIGO, BENIGN PAROXYSMAL POSITION 04/28/2007    PAST SURGICAL HISTORY   Past Surgical History:  Procedure Laterality Date   BACK SURGERY     COLONOSCOPY W/ BIOPSIES     EYE SURGERY Left    LUMBAR LAMINECTOMY/ DECOMPRESSION WITH MET-RX Left 03/01/2021   Procedure: LEFT LUMBAR FOUR-FIVE MINIMALLY INVASIVE LAMINECTOMY, LATERAL RECESS  DECOMPRESSION AND RESECTION OF SYNOVIAL CYST;  Surgeon: Dawley, Troy C, DO;  Location[facility];  Service: Neurosurgery;  Laterality: Left;   MOHS SURGERY     SPINE SURGERY     lumbar disk surgery    Immunization History  Administered Date(s) Administered   Fluad Quad(high Dose 65+) 05/10/2019, 06/01/2021   Influenza Split 04/14/2012, 05/31/2013   Influenza Whole 08/04/2005, 07/19/2007   Influenza, High Dose Seasonal PF 06/05/2015, 07/01/2016, 05/12/2018, 05/23/2020   Influenza,inj,Quad PF,6+ Mos 06/06/2017   Influenza-Unspecified 05/04/2014, 06/05/2015   PFIZER(Purple Top)SARS-COV-2 Vaccination 08/26/2019, 09/13/2019, 06/05/2020   Pneumococcal Conjugate-13 08/08/2014   Pneumococcal Polysaccharide-23 08/04/2004, 12/31/2010, 09/29/2019   Td 08/04/2006   Tdap 08/26/2021   Zoster, Live 04/04/2012    MEDICATIONS/ALLERGIES   No outpatient medications have been marked as taking for the 04/01/22 encounter (Appointment) with ,  W, MD.    Allergies  Allergen Reactions   Tape Rash    SOCIAL HISTORY/FAMILY HISTORY   Reviewed in Epic:  Pertinent findings:  Social History   Tobacco Use   Smoking status: Former    Packs/day: 1.00    Years: 20.00    Total pack years: 20.00    Types: Cigarettes    Quit date: 10/30/1978    Years since quitting: 43.4   Smokeless tobacco: Never  Vaping Use   Vaping Use: Never used  Substance Use Topics   Alcohol use: Yes    Comment: 1 burben and 1 beer      Drug use: No   Social History   Social History Narrative   Retired from [employer]   Lives at [place]   Enjoys golfing   Three daughters   Married     Right Handed    Has 2 granddaughters 1 grandson     OBJCTIVE -PE, EKG, labs   Wt Readings from Last 3 Encounters:  01/29/22 234 lb (106.1 kg)  01/06/22 228 lb 6.4 oz (103.6 kg)  08/13/21 236 lb (107 kg)    Physical Exam: There were no vitals taken for this visit. Physical Exam   Adult ECG Report  Rate: *** ;   Rhythm: {rhythm:17366};   Narrative Interpretation: ***  Recent Labs:  ***  Lab Results  Component Value Date   CHOL 143 02/03/2022   HDL 32.40 (L) 02/03/2022   LDLCALC 91 08/13/2021   LDLDIRECT 88.0 02/03/2022   TRIG 216.0 (H) 02/03/2022   CHOLHDL 4 02/03/2022   Lab Results  Component Value Date   CREATININE 0.84 01/17/2022   BUN 15 01/17/2022   NA 141 01/17/2022   K 4.6 01/17/2022   CL 104 01/17/2022   CO2 27 01/17/2022      Latest Ref Rng & Units 08/13/2021   10:50 AM 02/26/2021    8:53 AM 01/18/2020   11:08 AM  CBC  WBC 4.0 - 10.5 K/uL 9.8  7.3  7.9   Hemoglobin 13.0 - 17.0 g/dL 15.1  15.5  15.0   Hematocrit 39.0 - 52.0 % 45.0  45.2  42.7   Platelets 150.0 - 400.0 K/uL 224.0  205  204.0     Lab Results  Component Value Date   HGBA1C 6.1 08/13/2021   Lab Results  Component Value Date   TSH 2.80 01/18/2020    ================================================== I spent a total of ***minutes with the patient spent in direct patient consultation.  Additional time spent with chart review  / charting (studies, outside notes, etc): *** min Total Time: *** min  Current medicines are reviewed at length with the patient today.  (+/- concerns) ***  Notice: This dictation was prepared with Dragon dictation along with smart phrase technology. Any transcriptional errors that result from this process are unintentional and may not be corrected upon review.  Studies Ordered:   No orders of the defined types were placed in this encounter.  No orders of the defined types were placed in this encounter.   Patient Instructions / Medication Changes & Studies & Tests Ordered   There are no Patient Instructions on file for this visit.      W. , MD, MS  , M.D., M.S. Interventional Cardiologist  [facility]  Pager # [phone] Phone # [phone] [address] [place], [place] [address]   Thank you for choosing [facility]!!     

## 2022-04-01 ENCOUNTER — Ambulatory Visit: Payer: Medicare Other | Attending: Cardiology | Admitting: Cardiology

## 2022-04-01 ENCOUNTER — Encounter: Payer: Self-pay | Admitting: Cardiology

## 2022-04-01 VITALS — BP 126/72 | HR 57 | Ht 70.0 in | Wt 238.0 lb

## 2022-04-01 DIAGNOSIS — E785 Hyperlipidemia, unspecified: Secondary | ICD-10-CM | POA: Diagnosis not present

## 2022-04-01 DIAGNOSIS — R6889 Other general symptoms and signs: Secondary | ICD-10-CM

## 2022-04-01 DIAGNOSIS — J439 Emphysema, unspecified: Secondary | ICD-10-CM

## 2022-04-01 DIAGNOSIS — I700 Atherosclerosis of aorta: Secondary | ICD-10-CM | POA: Diagnosis not present

## 2022-04-01 DIAGNOSIS — R931 Abnormal findings on diagnostic imaging of heart and coronary circulation: Secondary | ICD-10-CM

## 2022-04-01 DIAGNOSIS — I2510 Atherosclerotic heart disease of native coronary artery without angina pectoris: Secondary | ICD-10-CM | POA: Diagnosis not present

## 2022-04-01 DIAGNOSIS — R739 Hyperglycemia, unspecified: Secondary | ICD-10-CM

## 2022-04-01 NOTE — Assessment & Plan Note (Signed)
As noted above.  Did not tolerate 40 mg Crestor.  Okay with 20 mg Crestor but not at goal on recent lipids.  He tells me that he had not really been on the Crestor for very long before the lab was checked and therefore I do not know how accurate it is.  We will reassess in about a month.  Based on those results would likely start Nexlizet (preferably as combination medication, but could do the individual medications if that is covered by his insurance).  We will then reassess labs in a couple months and have him follow-up with [facility] to further titrate.  I will see him back in 6 months.Marland Kitchen

## 2022-04-01 NOTE — Assessment & Plan Note (Signed)
Likely contributing some to his exertional dyspnea.

## 2022-04-01 NOTE — Assessment & Plan Note (Signed)
Doing better.  In the morning walks he is able to do more and enjoying it more.  More limited by neuropathy now

## 2022-04-01 NOTE — Assessment & Plan Note (Addendum)
Pretty significant Coronary Calcium Score with diffuse CAD with less than 50% stenosis in the RCA and LAD.  CT FFR and suggested that there is diffuse disease of the LAD with the very apical LAD may have borderline.  Less so in the RCA..  With these findings, I would not want invasive evaluation especially without any chest pain or pressure.  It does however warrant aggressive risk factor modification.  BP well controlled with no medications -- No beta blocker 2/2 bradycardia.  Major risk factor is weight which she is working with diet and exercise.  As well as his lipids.  He did not tolerate 40 mg of rosuvastatin so we will then continue him on 20 mg of rosuvastatin.  He will then have lipids rechecked in 2 months and reassess.  Expect would probably add Nexlizet either as a combination or individual medications.  He will then follow-up with [facility] lipid clinic for further titration.

## 2022-04-01 NOTE — Assessment & Plan Note (Signed)
Same treatment as for CAD

## 2022-04-01 NOTE — Patient Instructions (Addendum)
Medication Instructions:    No changes    *If you need a refill on your cardiac medications before your next appointment, please call your pharmacy*   Lab Work: In one month Lipid  CMP  Check in 4 months LIPID CMP If you have labs (blood work) drawn today and your tests are completely normal, you will receive your results only by: MyChart Message (if you have MyChart) OR A paper copy in the mail If you have any lab test that is abnormal or we need to change your treatment, we will call you to review the results.   Testing/Procedures: Not needed   Follow-Up: At [facility], you and your health needs are our priority.  As part of our continuing mission to provide you with exceptional heart care, we have created designated Provider Care Teams.  These Care Teams include your primary Cardiologist (physician) and Advanced Practice Providers (APPs -  Physician Assistants and Nurse Practitioners) who all work together to provide you with the care you need, when you need it.  We recommend signing up for the patient portal called "MyChart".  Sign up information is provided on this After Visit Summary.  MyChart is used to connect with patients for Virtual Visits (Telemedicine).  Patients are able to view lab/test results, encounter notes, upcoming appointments, etc.  Non-urgent messages can be sent to your provider as well.   To learn more about what you can do with MyChart, go to [email or web address].    Your next appointment:   6 month(s)  The format for your next appointment:   In Person  Provider:   Glenetta Hew, MD   You have been referred to  [facility] - 3 months f/u  lipid  Other Instructions

## 2022-04-01 NOTE — Assessment & Plan Note (Signed)
A1c 6.1.  Plays a role in metabolic syndrome, closely monitor.  Hopefully dietary adjustment will help.

## 2022-04-04 DIAGNOSIS — N401 Enlarged prostate with lower urinary tract symptoms: Secondary | ICD-10-CM | POA: Diagnosis not present

## 2022-04-04 DIAGNOSIS — N281 Cyst of kidney, acquired: Secondary | ICD-10-CM | POA: Diagnosis not present

## 2022-04-04 DIAGNOSIS — R972 Elevated prostate specific antigen [PSA]: Secondary | ICD-10-CM | POA: Diagnosis not present

## 2022-04-04 DIAGNOSIS — R350 Frequency of micturition: Secondary | ICD-10-CM | POA: Diagnosis not present

## 2022-04-06 ENCOUNTER — Telehealth: Payer: Self-pay | Admitting: Internal Medicine

## 2022-04-06 NOTE — Telephone Encounter (Signed)
Re Daniel Hampton Jan 05, 1941 - had ct chest oct 2022 with 56m nodule and emophysema. SAw TP in Nov 2022 with instructions for ROV in 6 months to see me. So far not seen. Plan -> do CT cheest without contrast in late oct/early nov 2023 -> return to see me in 15 min slot or APP

## 2022-04-08 ENCOUNTER — Other Ambulatory Visit: Payer: Self-pay | Admitting: Urology

## 2022-04-08 DIAGNOSIS — R972 Elevated prostate specific antigen [PSA]: Secondary | ICD-10-CM

## 2022-04-10 ENCOUNTER — Encounter: Payer: Self-pay | Admitting: Pharmacist

## 2022-04-10 ENCOUNTER — Ambulatory Visit: Payer: Medicare Other | Attending: Cardiology | Admitting: Pharmacist

## 2022-04-10 ENCOUNTER — Telehealth: Payer: Self-pay | Admitting: Pharmacist

## 2022-04-10 VITALS — Wt 240.0 lb

## 2022-04-10 DIAGNOSIS — I2510 Atherosclerotic heart disease of native coronary artery without angina pectoris: Secondary | ICD-10-CM | POA: Diagnosis not present

## 2022-04-10 DIAGNOSIS — E785 Hyperlipidemia, unspecified: Secondary | ICD-10-CM | POA: Diagnosis not present

## 2022-04-10 DIAGNOSIS — R931 Abnormal findings on diagnostic imaging of heart and coronary circulation: Secondary | ICD-10-CM | POA: Diagnosis not present

## 2022-04-10 DIAGNOSIS — H1045 Other chronic allergic conjunctivitis: Secondary | ICD-10-CM | POA: Insufficient documentation

## 2022-04-10 DIAGNOSIS — J301 Allergic rhinitis due to pollen: Secondary | ICD-10-CM | POA: Insufficient documentation

## 2022-04-10 DIAGNOSIS — K219 Gastro-esophageal reflux disease without esophagitis: Secondary | ICD-10-CM | POA: Insufficient documentation

## 2022-04-10 NOTE — Patient Instructions (Addendum)
It was nice meeting you today  We would like your LDL (bad cholesterol) to be less than 70  Please continue your rosuvastatin (Crestor) every evening  We would like to start you on a new medication called Repatha or Praluent.  Both are injected once every 2 weeks  I will complete the prior authorization for you and contact you when it is approved  Once you start the medication we will recheck your cholesterol in 2-3 months.  Middle of November.  Please call with any questions  Karren Cobble, PharmD, [place], [place], [place], [place], [place], [address] Phone: [phone], Fax: [phone]

## 2022-04-10 NOTE — Progress Notes (Unsigned)
Patient ID: Daniel Hampton                 DOB: [birth year]                    MRN: [id number]      HPI: Daniel Hampton is a 81 y.o. male patient referred to lipid clinic by Dr Ellyn Hack. PMH is significant for CAD, elevated coronary calcium score, and emphysema.    Patient presents today to discuss coronary artery disease. Had coronary CT due to daughter having an MI last year.  Chest CT showed calcium score of 1685. Patient has had no chest pain.  Had been managed for many years on atorvastatin and then simvastatin. However, after chest CT results came back, Dr Quay Burow started rosuvastatin '40mg'$ .  About 2 weeks later patient began feeling shoulder pain. He reduced dose to '20mg'$  and has had no muscle or joint pain since.  Since CT has been working on diet changes. Has reduced the amount of red meat and is eating more vegetables. Has been going for walks nearly daily.  Is not sure about family history.  Current Medications:  Rosuvastatin '20mg'$  daily  Intolerances:  Rosuvastatin '40mg'$   Risk Factors:  CAD Elevated Coronary calcium score  LDL goal: <70  Labs: TG 143, Trigs 216, HDL 32.4, LDL 88 (02/03/22)  Coronary calcium score of 1685. This was 91st percentile for age-, race-, and sex-matched controls  Past Medical History:  Diagnosis Date   Allergy    SEASONAL   Arthritis    Asthma    COLONIC POLYPS, HX OF 03/12/2007   [place] DISEASE, LUMBAR 12/03/2008   DIVERTICULOSIS, COLON 10/17/2009   History of kidney stones    Hydrocele    HYPERLIPIDEMIA 03/12/2007   Hyphema of right eye    Complicated by allergic conjunctivitis   Melanoma ([place]) 04/23/2020   in situ-Left Upper Arm   MORTON'S NEUROMA, RIGHT 03/13/2008   SCC (squamous cell carcinoma) 04/26/2013   right hand-txpbx   SKIN CANCER, HX OF 10/17/2009   VERTIGO, BENIGN PAROXYSMAL POSITION 04/28/2007    Current Outpatient Medications on File Prior to Visit  Medication Sig Dispense Refill   albuterol (VENTOLIN HFA) 108 (90 Base)  MCG/ACT inhaler Inhale 1-2 puffs into the lungs every 6 (six) hours as needed. 8 g 2   aspirin EC 81 MG tablet Take 81 mg by mouth daily. Swallow whole.     benzonatate (TESSALON) 200 MG capsule Take 1 capsule (200 mg total) by mouth 2 (two) times daily as needed for cough. 20 capsule 0   cetirizine (ZYRTEC) 10 MG tablet Take 1 tablet (10 mg total) by mouth daily. 30 tablet 11   Coenzyme Q10 (COQ-10 PO) Take by mouth.     dorzolamide-timolol (COSOPT) 22.3-6.8 MG/ML ophthalmic solution SMARTSIG:In Eye(s)     famotidine (PEPCID) 20 MG tablet Take 20 mg by mouth 2 (two) times daily.     fluticasone (FLONASE) 50 MCG/ACT nasal spray Place 2 sprays into both nostrils daily as needed for allergies or rhinitis.     metoprolol tartrate (LOPRESSOR) 25 MG tablet Take 1 tablet (25 mg total) by mouth once for 1 dose. TAKE TWO HOURS PRIOR TO  SCHEDULE CARDIAC TEST 1 tablet 0   METRONIDAZOLE, TOPICAL, 0.75 % LOTN Apply to rash once a day.  Use for up to 8 weeks 59 mL 0   Multiple Vitamins-Minerals (ALIVE MULTI-VITAMIN PO) Take by mouth.     Olopatadine HCl 0.2 % SOLN  1 drop into affected eye     prednisoLONE acetate (PRED FORTE) 1 % ophthalmic suspension SMARTSIG:In Eye(s)     tamsulosin (FLOMAX) 0.4 MG CAPS capsule Take 0.4 mg by mouth daily.     No current facility-administered medications on file prior to visit.    Allergies  Allergen Reactions   Tape Rash    Assessment/Plan:  1. Hyperlipidemia - Patient current LDL 88 which is above goal of <70 due to coronary calcium score. Since he could not tolerate rosuvastatin '40mg'$ , will continue '20mg'$  and start PCSK9i.    Using demo pen, educated patient on mechanism of action, storage, site selection, and administration. Patient was able to demonstrate using demo pen.  Will complete PA to see which product is preferred and contact patient when approved.  Will postpone lipid panel until November to gauge effect.  Patient voiced understanding.  Continue  rosuvastatin '20mg'$  daily Start Repatha/Praluent q 14 days Recheck lipid panel in 2-3 months  Karren Cobble, PharmD, [place], [place], [place], [place], [place], [address] Phone: [phone], Fax: [phone]

## 2022-04-10 NOTE — Telephone Encounter (Signed)
PA for Repatha submitted.  Key: [id number]

## 2022-04-11 MED ORDER — REPATHA SURECLICK 140 MG/ML ~~LOC~~ SOAJ: 1.0000 mL | SUBCUTANEOUS | 3 refills | Status: DC

## 2022-04-11 NOTE — Addendum Note (Signed)
Addended by: Rollen Sox on: 04/11/2022 08:15 AM   Modules accepted: Orders

## 2022-04-11 NOTE — Telephone Encounter (Signed)
PA approved through 04/12/23

## 2022-04-14 DIAGNOSIS — H40051 Ocular hypertension, right eye: Secondary | ICD-10-CM | POA: Diagnosis not present

## 2022-04-14 DIAGNOSIS — Z113 Encounter for screening for infections with a predominantly sexual mode of transmission: Secondary | ICD-10-CM | POA: Diagnosis not present

## 2022-04-14 DIAGNOSIS — H21541 Posterior synechiae (iris), right eye: Secondary | ICD-10-CM | POA: Insufficient documentation

## 2022-04-14 DIAGNOSIS — H3581 Retinal edema: Secondary | ICD-10-CM | POA: Insufficient documentation

## 2022-04-14 DIAGNOSIS — Z205 Contact with and (suspected) exposure to viral hepatitis: Secondary | ICD-10-CM | POA: Diagnosis not present

## 2022-04-14 DIAGNOSIS — H20051 Hypopyon, right eye: Secondary | ICD-10-CM | POA: Diagnosis not present

## 2022-04-14 DIAGNOSIS — Z1159 Encounter for screening for other viral diseases: Secondary | ICD-10-CM | POA: Diagnosis not present

## 2022-04-14 DIAGNOSIS — H16001 Unspecified corneal ulcer, right eye: Secondary | ICD-10-CM | POA: Diagnosis not present

## 2022-04-14 DIAGNOSIS — H2101 Hyphema, right eye: Secondary | ICD-10-CM | POA: Diagnosis not present

## 2022-04-14 DIAGNOSIS — H268 Other specified cataract: Secondary | ICD-10-CM | POA: Insufficient documentation

## 2022-04-14 DIAGNOSIS — H30033 Focal chorioretinal inflammation, peripheral, bilateral: Secondary | ICD-10-CM | POA: Diagnosis not present

## 2022-04-14 DIAGNOSIS — H209 Unspecified iridocyclitis: Secondary | ICD-10-CM | POA: Diagnosis not present

## 2022-04-25 ENCOUNTER — Ambulatory Visit
Admission: RE | Admit: 2022-04-25 | Discharge: 2022-04-25 | Disposition: A | Discharge status: Home / Self Care | Payer: Medicare Other | Source: Ambulatory Visit | Attending: Urology | Admitting: Urology

## 2022-04-25 DIAGNOSIS — N402 Nodular prostate without lower urinary tract symptoms: Secondary | ICD-10-CM | POA: Diagnosis not present

## 2022-04-25 DIAGNOSIS — N400 Enlarged prostate without lower urinary tract symptoms: Secondary | ICD-10-CM | POA: Diagnosis not present

## 2022-04-25 DIAGNOSIS — K5730 Diverticulosis of large intestine without perforation or abscess without bleeding: Secondary | ICD-10-CM | POA: Diagnosis not present

## 2022-04-25 DIAGNOSIS — R972 Elevated prostate specific antigen [PSA]: Secondary | ICD-10-CM

## 2022-04-25 MED ORDER — GADOBENATE DIMEGLUMINE 529 MG/ML IV SOLN
20.0000 mL | Freq: Once | INTRAVENOUS | Status: AC | PRN
  Administered 2022-04-25: 20 mL via INTRAVENOUS

## 2022-05-05 DIAGNOSIS — Z961 Presence of intraocular lens: Secondary | ICD-10-CM | POA: Diagnosis not present

## 2022-05-05 DIAGNOSIS — H20051 Hypopyon, right eye: Secondary | ICD-10-CM | POA: Diagnosis not present

## 2022-05-05 DIAGNOSIS — H02403 Unspecified ptosis of bilateral eyelids: Secondary | ICD-10-CM | POA: Diagnosis not present

## 2022-05-05 DIAGNOSIS — H2511 Age-related nuclear cataract, right eye: Secondary | ICD-10-CM | POA: Diagnosis not present

## 2022-05-05 DIAGNOSIS — H209 Unspecified iridocyclitis: Secondary | ICD-10-CM | POA: Diagnosis not present

## 2022-05-05 DIAGNOSIS — H2101 Hyphema, right eye: Secondary | ICD-10-CM | POA: Diagnosis not present

## 2022-05-05 DIAGNOSIS — H268 Other specified cataract: Secondary | ICD-10-CM | POA: Diagnosis not present

## 2022-05-05 DIAGNOSIS — H16001 Unspecified corneal ulcer, right eye: Secondary | ICD-10-CM | POA: Diagnosis not present

## 2022-05-05 DIAGNOSIS — H40051 Ocular hypertension, right eye: Secondary | ICD-10-CM | POA: Diagnosis not present

## 2022-05-11 ENCOUNTER — Inpatient Hospital Stay (HOSPITAL_COMMUNITY)
Admission: EM | Admit: 2022-05-11 | Discharge: 2022-05-15 | DRG: 439 | Disposition: A | Discharge status: Home / Self Care | Payer: Medicare Other | Attending: Family Medicine | Admitting: Family Medicine

## 2022-05-11 ENCOUNTER — Encounter (HOSPITAL_COMMUNITY): Payer: Self-pay

## 2022-05-11 DIAGNOSIS — K859 Acute pancreatitis without necrosis or infection, unspecified: Secondary | ICD-10-CM | POA: Diagnosis not present

## 2022-05-11 DIAGNOSIS — J439 Emphysema, unspecified: Secondary | ICD-10-CM | POA: Diagnosis present

## 2022-05-11 DIAGNOSIS — R7689 Other specified abnormal immunological findings in serum: Secondary | ICD-10-CM

## 2022-05-11 DIAGNOSIS — Z91048 Other nonmedicinal substance allergy status: Secondary | ICD-10-CM

## 2022-05-11 DIAGNOSIS — R760 Raised antibody titer: Secondary | ICD-10-CM | POA: Diagnosis not present

## 2022-05-11 DIAGNOSIS — N3000 Acute cystitis without hematuria: Secondary | ICD-10-CM | POA: Diagnosis not present

## 2022-05-11 DIAGNOSIS — T451X5A Adverse effect of antineoplastic and immunosuppressive drugs, initial encounter: Secondary | ICD-10-CM | POA: Diagnosis present

## 2022-05-11 DIAGNOSIS — I2510 Atherosclerotic heart disease of native coronary artery without angina pectoris: Secondary | ICD-10-CM | POA: Diagnosis present

## 2022-05-11 DIAGNOSIS — R17 Unspecified jaundice: Secondary | ICD-10-CM | POA: Diagnosis not present

## 2022-05-11 DIAGNOSIS — K853 Drug induced acute pancreatitis without necrosis or infection: Secondary | ICD-10-CM | POA: Diagnosis not present

## 2022-05-11 DIAGNOSIS — Z7982 Long term (current) use of aspirin: Secondary | ICD-10-CM | POA: Diagnosis not present

## 2022-05-11 DIAGNOSIS — D649 Anemia, unspecified: Secondary | ICD-10-CM | POA: Diagnosis present

## 2022-05-11 DIAGNOSIS — R768 Other specified abnormal immunological findings in serum: Secondary | ICD-10-CM | POA: Diagnosis not present

## 2022-05-11 DIAGNOSIS — H209 Unspecified iridocyclitis: Secondary | ICD-10-CM

## 2022-05-11 DIAGNOSIS — Z23 Encounter for immunization: Secondary | ICD-10-CM | POA: Diagnosis not present

## 2022-05-11 DIAGNOSIS — K7689 Other specified diseases of liver: Secondary | ICD-10-CM | POA: Diagnosis not present

## 2022-05-11 DIAGNOSIS — Z6833 Body mass index (BMI) 33.0-33.9, adult: Secondary | ICD-10-CM

## 2022-05-11 DIAGNOSIS — Z87891 Personal history of nicotine dependence: Secondary | ICD-10-CM

## 2022-05-11 DIAGNOSIS — Z8249 Family history of ischemic heart disease and other diseases of the circulatory system: Secondary | ICD-10-CM | POA: Diagnosis not present

## 2022-05-11 DIAGNOSIS — Z888 Allergy status to other drugs, medicaments and biological substances status: Secondary | ICD-10-CM

## 2022-05-11 DIAGNOSIS — N390 Urinary tract infection, site not specified: Secondary | ICD-10-CM

## 2022-05-11 DIAGNOSIS — E785 Hyperlipidemia, unspecified: Secondary | ICD-10-CM | POA: Diagnosis not present

## 2022-05-11 DIAGNOSIS — K5730 Diverticulosis of large intestine without perforation or abscess without bleeding: Secondary | ICD-10-CM | POA: Diagnosis not present

## 2022-05-11 DIAGNOSIS — Z79899 Other long term (current) drug therapy: Secondary | ICD-10-CM

## 2022-05-11 DIAGNOSIS — J45909 Unspecified asthma, uncomplicated: Secondary | ICD-10-CM | POA: Diagnosis present

## 2022-05-11 DIAGNOSIS — E1169 Type 2 diabetes mellitus with other specified complication: Secondary | ICD-10-CM | POA: Diagnosis present

## 2022-05-11 DIAGNOSIS — Z86010 Personal history of colonic polyps: Secondary | ICD-10-CM | POA: Diagnosis not present

## 2022-05-11 DIAGNOSIS — E669 Obesity, unspecified: Secondary | ICD-10-CM | POA: Diagnosis not present

## 2022-05-11 DIAGNOSIS — Z79620 Long term (current) use of immunosuppressive biologic: Secondary | ICD-10-CM

## 2022-05-11 DIAGNOSIS — R1013 Epigastric pain: Secondary | ICD-10-CM | POA: Diagnosis not present

## 2022-05-11 DIAGNOSIS — R9431 Abnormal electrocardiogram [ECG] [EKG]: Secondary | ICD-10-CM | POA: Diagnosis not present

## 2022-05-11 LAB — CBG MONITORING, ED: Glucose-Capillary: 178 mg/dL — ABNORMAL HIGH (ref 70–99)

## 2022-05-11 NOTE — ED Triage Notes (Signed)
Patient arrived with complaints of upper abdominal pain, NV since 430pm

## 2022-05-12 ENCOUNTER — Encounter (HOSPITAL_COMMUNITY): Payer: Self-pay

## 2022-05-12 ENCOUNTER — Emergency Department (HOSPITAL_COMMUNITY): Payer: Medicare Other

## 2022-05-12 ENCOUNTER — Other Ambulatory Visit: Payer: Self-pay

## 2022-05-12 ENCOUNTER — Observation Stay (HOSPITAL_COMMUNITY): Payer: Medicare Other

## 2022-05-12 DIAGNOSIS — H209 Unspecified iridocyclitis: Secondary | ICD-10-CM | POA: Diagnosis not present

## 2022-05-12 DIAGNOSIS — I2510 Atherosclerotic heart disease of native coronary artery without angina pectoris: Secondary | ICD-10-CM

## 2022-05-12 DIAGNOSIS — J439 Emphysema, unspecified: Secondary | ICD-10-CM

## 2022-05-12 DIAGNOSIS — K859 Acute pancreatitis without necrosis or infection, unspecified: Secondary | ICD-10-CM | POA: Diagnosis present

## 2022-05-12 DIAGNOSIS — N390 Urinary tract infection, site not specified: Secondary | ICD-10-CM

## 2022-05-12 DIAGNOSIS — R768 Other specified abnormal immunological findings in serum: Secondary | ICD-10-CM

## 2022-05-12 DIAGNOSIS — E785 Hyperlipidemia, unspecified: Secondary | ICD-10-CM

## 2022-05-12 DIAGNOSIS — K7689 Other specified diseases of liver: Secondary | ICD-10-CM | POA: Diagnosis not present

## 2022-05-12 LAB — HEPATITIS PANEL, ACUTE
HCV Ab: NONREACTIVE
Hep A IgM: NONREACTIVE
Hep B C IgM: NONREACTIVE
Hepatitis B Surface Ag: NONREACTIVE

## 2022-05-12 LAB — COMPREHENSIVE METABOLIC PANEL
ALT: 35 U/L (ref 0–44)
ALT: 43 U/L (ref 0–44)
AST: 31 U/L (ref 15–41)
AST: 40 U/L (ref 15–41)
Albumin: 3.6 g/dL (ref 3.5–5.0)
Albumin: 4.2 g/dL (ref 3.5–5.0)
Alkaline Phosphatase: 43 U/L (ref 38–126)
Alkaline Phosphatase: 50 U/L (ref 38–126)
Anion gap: 10 (ref 5–15)
Anion gap: 11 (ref 5–15)
BUN: 16 mg/dL (ref 8–23)
BUN: 18 mg/dL (ref 8–23)
CO2: 23 mmol/L (ref 22–32)
CO2: 24 mmol/L (ref 22–32)
Calcium: 8.7 mg/dL — ABNORMAL LOW (ref 8.9–10.3)
Calcium: 9.3 mg/dL (ref 8.9–10.3)
Chloride: 103 mmol/L (ref 98–111)
Chloride: 105 mmol/L (ref 98–111)
Creatinine, Ser: 1.03 mg/dL (ref 0.61–1.24)
Creatinine, Ser: 1.04 mg/dL (ref 0.61–1.24)
GFR, Estimated: >60 mL/min (ref >60)
GFR, Estimated: >60 mL/min (ref >60)
Glucose, Bld: 147 mg/dL — ABNORMAL HIGH (ref 70–99)
Glucose, Bld: 188 mg/dL — ABNORMAL HIGH (ref 70–99)
Potassium: 4 mmol/L (ref 3.5–5.1)
Potassium: 4.1 mmol/L (ref 3.5–5.1)
Sodium: 138 mmol/L (ref 135–145)
Sodium: 138 mmol/L (ref 135–145)
Total Bilirubin: 1.1 mg/dL (ref 0.3–1.2)
Total Bilirubin: 1.5 mg/dL — ABNORMAL HIGH (ref 0.3–1.2)
Total Protein: 6.9 g/dL (ref 6.5–8.1)
Total Protein: 7.8 g/dL (ref 6.5–8.1)

## 2022-05-12 LAB — URINALYSIS, ROUTINE W REFLEX MICROSCOPIC
Bilirubin Urine: NEGATIVE
Glucose, UA: NEGATIVE mg/dL
Hgb urine dipstick: NEGATIVE
Ketones, ur: 5 mg/dL — AB
Nitrite: POSITIVE — AB
Protein, ur: 30 mg/dL — AB
Specific Gravity, Urine: 1.036 — ABNORMAL HIGH (ref 1.005–1.030)
WBC, UA: >50 WBC/hpf — ABNORMAL HIGH (ref 0–5)
pH: 5 (ref 5.0–8.0)

## 2022-05-12 LAB — CBC
HCT: 34.6 % — ABNORMAL LOW (ref 39.0–52.0)
HCT: 36.6 % — ABNORMAL LOW (ref 39.0–52.0)
HCT: 40.5 % (ref 39.0–52.0)
Hemoglobin: 11.5 g/dL — ABNORMAL LOW (ref 13.0–17.0)
Hemoglobin: 12.3 g/dL — ABNORMAL LOW (ref 13.0–17.0)
Hemoglobin: 13.7 g/dL (ref 13.0–17.0)
MCH: 30.5 pg (ref 26.0–34.0)
MCH: 30.6 pg (ref 26.0–34.0)
MCH: 30.8 pg (ref 26.0–34.0)
MCHC: 33.2 g/dL (ref 30.0–36.0)
MCHC: 33.6 g/dL (ref 30.0–36.0)
MCHC: 33.8 g/dL (ref 30.0–36.0)
MCV: 90.6 fL (ref 80.0–100.0)
MCV: 91.5 fL (ref 80.0–100.0)
MCV: 91.8 fL (ref 80.0–100.0)
Platelets: 195 10*3/uL (ref 150–400)
Platelets: 215 10*3/uL (ref 150–400)
Platelets: 228 10*3/uL (ref 150–400)
RBC: 3.77 MIL/uL — ABNORMAL LOW (ref 4.22–5.81)
RBC: 4 MIL/uL — ABNORMAL LOW (ref 4.22–5.81)
RBC: 4.47 MIL/uL (ref 4.22–5.81)
RDW: 13.2 % (ref 11.5–15.5)
RDW: 13.3 % (ref 11.5–15.5)
RDW: 13.4 % (ref 11.5–15.5)
WBC: 13.9 10*3/uL — ABNORMAL HIGH (ref 4.0–10.5)
WBC: 15.1 10*3/uL — ABNORMAL HIGH (ref 4.0–10.5)
WBC: 16.1 10*3/uL — ABNORMAL HIGH (ref 4.0–10.5)
nRBC: 0 % (ref 0.0–0.2)
nRBC: 0 % (ref 0.0–0.2)
nRBC: 0 % (ref 0.0–0.2)

## 2022-05-12 LAB — CREATININE, SERUM
Creatinine, Ser: 1.03 mg/dL (ref 0.61–1.24)
GFR, Estimated: >60 mL/min (ref >60)

## 2022-05-12 LAB — MAGNESIUM: Magnesium: 1.9 mg/dL (ref 1.7–2.4)

## 2022-05-12 LAB — TRIGLYCERIDES: Triglycerides: 63 mg/dL (ref <150)

## 2022-05-12 LAB — PHOSPHORUS: Phosphorus: 3.8 mg/dL (ref 2.5–4.6)

## 2022-05-12 LAB — ETHANOL: Alcohol, Ethyl (B): <10 mg/dL (ref <10)

## 2022-05-12 LAB — LIPASE, BLOOD: Lipase: 325 U/L — ABNORMAL HIGH (ref 11–51)

## 2022-05-12 MED ORDER — ROSUVASTATIN CALCIUM 20 MG PO TABS
20.0000 mg | ORAL_TABLET | Freq: Every day | ORAL | Status: DC
  Administered 2022-05-12 – 2022-05-15 (×4): 20 mg via ORAL
  Filled 2022-05-12 (×4): qty 1

## 2022-05-12 MED ORDER — MORPHINE SULFATE (PF) 2 MG/ML IV SOLN
2.0000 mg | INTRAVENOUS | Status: DC | PRN
  Administered 2022-05-12: 2 mg via INTRAVENOUS
  Filled 2022-05-12 (×2): qty 1

## 2022-05-12 MED ORDER — FAMOTIDINE 20 MG PO TABS
20.0000 mg | ORAL_TABLET | Freq: Two times a day (BID) | ORAL | Status: DC
  Administered 2022-05-12 – 2022-05-15 (×7): 20 mg via ORAL
  Filled 2022-05-12 (×7): qty 1

## 2022-05-12 MED ORDER — SODIUM CHLORIDE 0.9 % IV SOLN
1.0000 g | Freq: Once | INTRAVENOUS | Status: AC
  Administered 2022-05-12: 1 g via INTRAVENOUS
  Filled 2022-05-12: qty 10

## 2022-05-12 MED ORDER — ONDANSETRON HCL 4 MG/2ML IJ SOLN
4.0000 mg | Freq: Once | INTRAMUSCULAR | Status: AC
  Administered 2022-05-12: 4 mg via INTRAVENOUS
  Filled 2022-05-12: qty 2

## 2022-05-12 MED ORDER — SODIUM CHLORIDE 0.9 % IV SOLN
1.0000 g | INTRAVENOUS | Status: DC
  Administered 2022-05-12 – 2022-05-14 (×3): 1 g via INTRAVENOUS
  Filled 2022-05-12 (×3): qty 10

## 2022-05-12 MED ORDER — SODIUM CHLORIDE 0.9 % IV BOLUS
1000.0000 mL | Freq: Once | INTRAVENOUS | Status: AC
  Administered 2022-05-12: 1000 mL via INTRAVENOUS

## 2022-05-12 MED ORDER — AZATHIOPRINE 50 MG PO TABS
50.0000 mg | ORAL_TABLET | Freq: Every day | ORAL | Status: DC
  Filled 2022-05-12: qty 1

## 2022-05-12 MED ORDER — ONDANSETRON HCL 4 MG PO TABS: 4.0000 mg | ORAL_TABLET | Freq: Four times a day (QID) | ORAL | Status: DC | PRN

## 2022-05-12 MED ORDER — INFLUENZA VAC A&B SA ADJ QUAD 0.5 ML IM PRSY
0.5000 mL | PREFILLED_SYRINGE | INTRAMUSCULAR | Status: AC
  Administered 2022-05-13: 0.5 mL via INTRAMUSCULAR
  Filled 2022-05-12: qty 0.5

## 2022-05-12 MED ORDER — ONDANSETRON HCL 4 MG/2ML IJ SOLN: 4.0000 mg | Freq: Four times a day (QID) | INTRAMUSCULAR | Status: DC | PRN

## 2022-05-12 MED ORDER — ACETAMINOPHEN 325 MG PO TABS
650.0000 mg | ORAL_TABLET | Freq: Four times a day (QID) | ORAL | Status: DC | PRN
  Administered 2022-05-12: 650 mg via ORAL
  Filled 2022-05-12: qty 2

## 2022-05-12 MED ORDER — TAMSULOSIN HCL 0.4 MG PO CAPS
0.4000 mg | ORAL_CAPSULE | Freq: Every day | ORAL | Status: DC
  Administered 2022-05-12 – 2022-05-13 (×2): 0.4 mg via ORAL
  Filled 2022-05-12 (×3): qty 1

## 2022-05-12 MED ORDER — DORZOLAMIDE HCL-TIMOLOL MAL 2-0.5 % OP SOLN
1.0000 [drp] | Freq: Two times a day (BID) | OPHTHALMIC | Status: DC
  Administered 2022-05-12 – 2022-05-15 (×7): 1 [drp] via OPHTHALMIC
  Filled 2022-05-12: qty 10

## 2022-05-12 MED ORDER — FLUTICASONE PROPIONATE 50 MCG/ACT NA SUSP: 2.0000 | Freq: Every day | NASAL | Status: DC | PRN

## 2022-05-12 MED ORDER — ASPIRIN 81 MG PO TBEC
81.0000 mg | DELAYED_RELEASE_TABLET | Freq: Every day | ORAL | Status: DC
  Administered 2022-05-12 – 2022-05-15 (×4): 81 mg via ORAL
  Filled 2022-05-12 (×4): qty 1

## 2022-05-12 MED ORDER — COQ-10 30 MG PO CAPS: ORAL_CAPSULE | Freq: Every day | ORAL | Status: DC

## 2022-05-12 MED ORDER — SODIUM CHLORIDE 0.9 % IV SOLN: INTRAVENOUS | Status: DC

## 2022-05-12 MED ORDER — ENOXAPARIN SODIUM 40 MG/0.4ML IJ SOSY
40.0000 mg | PREFILLED_SYRINGE | INTRAMUSCULAR | Status: DC
  Administered 2022-05-12 – 2022-05-15 (×4): 40 mg via SUBCUTANEOUS
  Filled 2022-05-12 (×4): qty 0.4

## 2022-05-12 MED ORDER — PREDNISOLONE ACETATE 1 % OP SUSP
1.0000 [drp] | Freq: Every day | OPHTHALMIC | Status: DC
  Administered 2022-05-12 – 2022-05-15 (×4): 1 [drp] via OPHTHALMIC
  Filled 2022-05-12: qty 5

## 2022-05-12 MED ORDER — ALBUTEROL SULFATE (2.5 MG/3ML) 0.083% IN NEBU: 3.0000 mL | INHALATION_SOLUTION | Freq: Four times a day (QID) | RESPIRATORY_TRACT | Status: DC | PRN

## 2022-05-12 MED ORDER — ADULT MULTIVITAMIN W/MINERALS CH
1.0000 | ORAL_TABLET | Freq: Every day | ORAL | Status: DC
  Administered 2022-05-12 – 2022-05-15 (×4): 1 via ORAL
  Filled 2022-05-12 (×4): qty 1

## 2022-05-12 MED ORDER — IOHEXOL 300 MG/ML  SOLN
100.0000 mL | Freq: Once | INTRAMUSCULAR | Status: AC | PRN
  Administered 2022-05-12: 100 mL via INTRAVENOUS

## 2022-05-12 NOTE — Assessment & Plan Note (Signed)
Acute abdominal pain w/ nausea and vomiting x 1-2 days  Lipase 533  CT A&P.  Consistent with pancreatitis Patient does report some occasional alcohol use as well as occasional fatty meals Was noted to be recent started on Imuran for?  Inflammatory eye disease by [facility] We will check triglyceride levels as well as alcohol level T. bili mildly elevated at 1.5 We will check right upper quadrant ultrasound ?  if Imuran confounder as pancreatitis is one of the adverse reactions from medication IV fluids Antiemetics N.p.o. Pain control Follow

## 2022-05-12 NOTE — H&P (Addendum)
History and Physical    Patient: Daniel Hampton CNO:[id number] DOB: 1940-09-14 DOA: 05/11/2022 DOS: the patient was seen and examined on 05/12/2022 PCP: [name], MD  Patient coming from: Home  Chief Complaint:  Chief Complaint  Patient presents with   Abdominal Pain   HPI: Daniel Hampton is a 81 y.o. male with medical history significant of asthma, hyperlipidemia, nonocclusive coronary artery disease, hyperlipidemia, anterior uveitis with HLA-B 51 positivity presenting with abdominal pain and pancreatitis.  Patient reports 24 hours of progressive nausea vomiting and decreased p.o. intake.  Also generalized abdominal pain.  Patient states he attempted to eat with his granddaughter earlier today and was unable to do so.  Patient states he has had minimal p.o. intake over the past 1 to 2 days.  Monitor episodes of nonbilious nonbloody vomiting.  No chest pain or shortness of breath.  No hemiparesis or confusion.  Patient reports a remote episode like this back in May.  Does eat some fatty foods at times.  Also occasionally drinks 1-2 shots of bourbon.  Denies any heavy alcohol use.  He is fairly active.  Quit smoking many years ago.  Denies any recent significant medication changes apart from being started on Imuran in the setting of anterior uveitis.  Ophthalmologically issues are being followed at [facility].  Positive dysuria and increased urinary frequency along the same timeframe. Presented to the ER afebrile, hemodynamically stable.  White count of 14, lipase in the 500s.  T. bili 1.5.  LFTs within normal limits.  Urinalysis indicative of infection.  CT of the abdomen pelvis indicative of pancreatitis.  Also with extensive coronary artery calcification and colonic diverticulosis. Review of Systems: As mentioned in the history of present illness. All other systems reviewed and are negative. Past Medical History:  Diagnosis Date   Allergy    SEASONAL   Arthritis    Asthma     COLONIC POLYPS, HX OF 03/12/2007   [place] DISEASE, LUMBAR 12/03/2008   DIVERTICULOSIS, COLON 10/17/2009   History of kidney stones    Hydrocele    HYPERLIPIDEMIA 03/12/2007   Hyphema of right eye    Complicated by allergic conjunctivitis   Melanoma ([place]) 04/23/2020   in situ-Left Upper Arm   MORTON'S NEUROMA, RIGHT 03/13/2008   SCC (squamous cell carcinoma) 04/26/2013   right hand-txpbx   SKIN CANCER, HX OF 10/17/2009   VERTIGO, BENIGN PAROXYSMAL POSITION 04/28/2007   Past Surgical History:  Procedure Laterality Date   BACK SURGERY     COLONOSCOPY W/ BIOPSIES     CT CTA CORONARY W/CA SCORE W/CM &/OR WO/CM  01/27/2022   1. Coronary artery calcium score 1540 Agatston units. This places the patient in the 76th percentile for age and gender, suggesting intermediate risk for future events.  2. Extensive coronary disease but appears nonobstructive. This is confirmed by CT FFR. 3. 4.2 cm dilation of the ascending aorta,   EYE SURGERY Left    LUMBAR LAMINECTOMY/ DECOMPRESSION WITH MET-RX Left 03/01/2021   Procedure: LEFT LUMBAR FOUR-FIVE MINIMALLY INVASIVE LAMINECTOMY, LATERAL RECESS DECOMPRESSION AND RESECTION OF SYNOVIAL CYST;  Surgeon: Karsten Ro, DO;  Location: [place];  Service: Neurosurgery;  Laterality: Left;   MOHS SURGERY     SPINE SURGERY     lumbar disk surgery   Social History:  reports that he quit smoking about 43 years ago. His smoking use included cigarettes. He has a 20.00 pack-year smoking history. He has never used smokeless tobacco. He reports current  alcohol use. He reports that he does not use drugs.  Allergies  Allergen Reactions   Atorvastatin Other (See Comments)    myalgia   Crestor [Rosuvastatin] Other (See Comments)    Too high of a dose makes his muscles ache   Tape Rash    Family History  Problem Relation Age of Onset   Lung cancer Mother    Ovarian cancer Mother    COPD Father    Heart attack Father    Alcohol abuse Brother    Lung cancer Brother         smoker   Breast cancer Daughter    Breast cancer Daughter    Colon cancer Neg Hx    Esophageal cancer Neg Hx    Rectal cancer Neg Hx    Stomach cancer Neg Hx     Prior to Admission medications   Medication Sig Start Date End Date Taking? Authorizing Provider  aspirin EC 81 MG tablet Take 81 mg by mouth daily. Swallow whole.   Yes [provider]  azaTHIOprine (IMURAN) 50 MG tablet Take 50 mg by mouth daily.   Yes [provider]  cetirizine (ZYRTEC) 10 MG tablet Take 1 tablet (10 mg total) by mouth daily. 08/13/21  Yes Burns, Claudina Lick, MD  Coenzyme Q10 (COQ-10 PO) Take 1 tablet by mouth daily.   Yes [provider]  dorzolamide-timolol (COSOPT) 22.3-6.8 MG/ML ophthalmic solution Place 1 drop into the right eye 2 (two) times daily. 09/04/21  Yes [provider]  Evolocumab (REPATHA SURECLICK) 967 MG/ML SOAJ Inject 1 mL into the skin every 14 (fourteen) days. 04/11/22  Yes Leonie Man, MD  famotidine (PEPCID) 20 MG tablet Take 20 mg by mouth 2 (two) times daily. 07/02/21  Yes [provider]  fluticasone (FLONASE) 50 MCG/ACT nasal spray Place 2 sprays into both nostrils daily as needed for allergies or rhinitis.   Yes [provider]  Multiple Vitamins-Minerals (ALIVE MULTI-VITAMIN PO) Take 1 tablet by mouth daily.   Yes [provider]  prednisoLONE acetate (PRED FORTE) 1 % ophthalmic suspension Place 1 drop into the right eye daily. 12/31/21  Yes [provider]  rosuvastatin (CRESTOR) 40 MG tablet Take 20 mg by mouth daily. Takes 1/2 tablet (90m)   Yes [provider]  tamsulosin (FLOMAX) 0.4 MG CAPS capsule Take 0.4 mg by mouth daily. 03/21/21  Yes [provider]  albuterol (VENTOLIN HFA) 108 (90 Base) MCG/ACT inhaler Inhale 1-2 puffs into the lungs every 6 (six) hours as needed. Patient not taking: Reported on 05/12/2022 07/02/21   Parrett, TFonnie Mu NP  benzonatate (TESSALON) 200 MG capsule Take  1 capsule (200 mg total) by mouth 2 (two) times daily as needed for cough. Patient not taking: Reported on 05/12/2022 01/29/22   HHarland DingwallL, NP-C  metoprolol tartrate (LOPRESSOR) 25 MG tablet Take 1 tablet (25 mg total) by mouth once for 1 dose. TAKE TWO HOURS PRIOR TO  SCHEDULE CARDIAC TEST Patient not taking: Reported on 05/12/2022 01/06/22 01/29/22  HLeonie Man MD  METRONIDAZOLE, TOPICAL, 0.75 % LOTN Apply to rash once a day.  Use for up to 8 weeks Patient not taking: Reported on 05/12/2022 08/15/21   [name]    Physical Exam: Physical Exam Constitutional:      General: He is not in acute distress.    Appearance: He is normal weight.  HENT:     Head: Normocephalic and atraumatic.  Eyes:  Comments: Positive mild right eye swelling and redness  Cardiovascular:     Rate and Rhythm: Normal rate and regular rhythm.  Pulmonary:     Effort: Pulmonary effort is normal.     Breath sounds: Normal breath sounds.  Abdominal:     General: Bowel sounds are normal.     Palpations: Abdomen is soft.     Tenderness: There is no abdominal tenderness.  Skin:    General: Skin is warm.  Neurological:     General: No focal deficit present.  Psychiatric:        Mood and Affect: Mood normal.     Vitals:   05/12/22 0130 05/12/22 0200 05/12/22 0230 05/12/22 0300  BP: 130/72 121/68 118/70 (!) 135/56  Pulse: 91 89 88 84  Resp: 20 (!) 23 (!) 25 17  Temp:      TempSrc:      SpO2: 96% 94% 93% 94%    Data Reviewed:  There are no new results to review at this time.  Assessment and Plan: * Pancreatitis Acute abdominal pain w/ nausea and vomiting x 1-2 days  Lipase 533  CT A&P.  Consistent with pancreatitis Patient does report some occasional alcohol use as well as occasional fatty meals Was noted to be recent started on Imuran for?  Inflammatory eye disease by [facility] We will check triglyceride levels as well as alcohol level T. bili mildly elevated at 1.5 We will  check right upper quadrant ultrasound ?  if Imuran confounder as pancreatitis is one of the adverse reactions from medication IV fluids Antiemetics N.p.o. Pain control Follow     Coronary artery disease, non-occlusive No active chest pain Continue home regimen   UTI (urinary tract infection) Positive increased urinary frequency with urinalysis indicative of infection and white count of 14 IV Rocephin Urine culture Follow  Hepatitis A antibody positive Patient noted to be hepatitis A IgG and IgM antibody positive from lab testing at [facility] on September 11 Noted concurrent immunosuppression with Imuran in the setting of anterior uveitis?  Behet disease LFTs are within normal limits Given abdominal pain in setting of pancreatitis with immunosuppression, will repeat x1 GI consult as clinically indicated  Anterior uveitis Patient currently being treated at [facility] for anterior  uveitis Also noted to be HLA-B 51 positive with some?  Concern for behcet disease  On azathioprine and ophthalmic prednisone among other things Continue azathioprine for now Consider titration of medication in setting of active pancreatitis Follow closely  Emphysema lung (HCC) Stable from respiratory standpoint Pending outpatient CT by pulmonology Follow-up for now  DuoNebs as clinically indicated  Hyperlipidemia with target LDL less than 70 Cont statin       Advance Care Planning:   Code Status: Full Code   Consults: none  Family Communication: Wife at bedside   Severity of Illness: The appropriate patient status for this patient is OBSERVATION. Observation status is judged to be reasonable and necessary in order to provide the required intensity of service to ensure the patient's safety. The patient's presenting symptoms, physical exam findings, and initial radiographic and laboratory data in the context of their medical condition is felt to place them at  decreased risk for further clinical deterioration. Furthermore, it is anticipated that the patient will be medically stable for discharge from the hospital within 2 midnights of admission.   Author: Deneise Lever, MD 05/12/2022 3:16 AM  For on call review [email or web address].

## 2022-05-12 NOTE — Progress Notes (Signed)
Mobility Specialist - Progress Note   05/12/22 1000  Mobility  Activity Ambulated independently to bathroom;Ambulated independently in room  Activity Response Tolerated well  Distance Ambulated (ft) 20 ft  $Mobility charge 1 Mobility  Level of Assistance Independent after set-up  Assistive Device None  Range of Motion/Exercises Active   Pt was found on recliner chair and agreeable to ambulate. Upon getting up stumbled and stated that his R ankle felt sore. At EOS was left in recliner chair with all necessities in reach and RN notified.  Ferd Hibbs Mobility Specialist

## 2022-05-12 NOTE — ED Provider Notes (Signed)
[place] DEPT Provider Note   CSN: [id number] Arrival date & time: 05/11/22  2327     History  Chief Complaint  Patient presents with   Abdominal Pain    Daniel Hampton is a 81 y.o. male.  Patient is an 81 year old male with past medical history of hyperlipidemia, coronary artery disease, lumbar radiculopathy.  Patient presenting today with complaints of abdominal pain and nausea.  This started this afternoon at approximately 430.  He denies vomiting, diarrhea, or constipation.  He denies any fevers or chills.  There are no known ill contacts.  There are no aggravating or alleviating factors.  Discomfort is mainly epigastric with no radiation into the back or groin.  The history is provided by the patient.       Home Medications Prior to Admission medications   Medication Sig Start Date End Date Taking? Authorizing Provider  albuterol (VENTOLIN HFA) 108 (90 Base) MCG/ACT inhaler Inhale 1-2 puffs into the lungs every 6 (six) hours as needed. 07/02/21   Parrett, Fonnie Mu, NP  aspirin EC 81 MG tablet Take 81 mg by mouth daily. Swallow whole.    [provider]  benzonatate (TESSALON) 200 MG capsule Take 1 capsule (200 mg total) by mouth 2 (two) times daily as needed for cough. 01/29/22   Henson, Vickie L, NP-C  cetirizine (ZYRTEC) 10 MG tablet Take 1 tablet (10 mg total) by mouth daily. 08/13/21   [name], MD  Coenzyme Q10 (COQ-10 PO) Take by mouth.    [provider]  dorzolamide-timolol (COSOPT) 22.3-6.8 MG/ML ophthalmic solution SMARTSIG:In Eye(s) 09/04/21   [provider]  Evolocumab (REPATHA SURECLICK) 092 MG/ML SOAJ Inject 1 mL into the skin every 14 (fourteen) days. 04/11/22   Leonie Man, MD  famotidine (PEPCID) 20 MG tablet Take 20 mg by mouth 2 (two) times daily. 07/02/21   [provider]  fluticasone (FLONASE) 50 MCG/ACT nasal spray Place 2 sprays into both nostrils daily as needed for allergies or  rhinitis.    [provider]  metoprolol tartrate (LOPRESSOR) 25 MG tablet Take 1 tablet (25 mg total) by mouth once for 1 dose. TAKE TWO HOURS PRIOR TO  SCHEDULE CARDIAC TEST 01/06/22 01/29/22  Leonie Man, MD  METRONIDAZOLE, TOPICAL, 0.75 % LOTN Apply to rash once a day.  Use for up to 8 weeks 08/15/21   [name], MD  Multiple Vitamins-Minerals (ALIVE MULTI-VITAMIN PO) Take by mouth.    [provider]  Olopatadine HCl 0.2 % SOLN 1 drop into affected eye    [provider]  prednisoLONE acetate (PRED FORTE) 1 % ophthalmic suspension SMARTSIG:In Eye(s) 12/31/21   [provider]  rosuvastatin (CRESTOR) 40 MG tablet Take 20 mg by mouth daily. Takes 1/2 tablet ('20mg'$ )    [provider]  tamsulosin (FLOMAX) 0.4 MG CAPS capsule Take 0.4 mg by mouth daily. 03/21/21   [provider]      Allergies    Atorvastatin, Crestor [rosuvastatin], and Tape    Review of Systems   Review of Systems  All other systems reviewed and are negative.   Physical Exam Updated Vital Signs BP (!) 140/71   Pulse 85   Temp 99.5 F (37.5 C) (Oral)   Resp 15   SpO2 96%  Physical Exam Vitals and nursing note reviewed.  Constitutional:      General: He is not in acute distress.    Appearance: He is well-developed. He is not  diaphoretic.  HENT:     Head: Normocephalic and atraumatic.  Cardiovascular:     Rate and Rhythm: Normal rate and regular rhythm.     Heart sounds: No murmur heard.    No friction rub.  Pulmonary:     Effort: Pulmonary effort is normal. No respiratory distress.     Breath sounds: Normal breath sounds. No wheezing or rales.  Abdominal:     General: Bowel sounds are normal. There is no distension.     Palpations: Abdomen is soft.     Tenderness: There is abdominal tenderness in the epigastric area.     Comments: There is mild tenderness in the epigastric region with no rebound or guarding.  Musculoskeletal:        General:  Normal range of motion.     Cervical back: Normal range of motion and neck supple.  Skin:    General: Skin is warm and dry.  Neurological:     Mental Status: He is alert and oriented to person, place, and time.     Coordination: Coordination normal.     ED Results / Procedures / Treatments   Labs (all labs ordered are listed, but only abnormal results are displayed) Labs Reviewed  CBG MONITORING, ED - Abnormal; Notable for the following components:      Result Value   Glucose-Capillary 178 (*)    All other components within normal limits  LIPASE, BLOOD  COMPREHENSIVE METABOLIC PANEL  CBC  URINALYSIS, ROUTINE W REFLEX MICROSCOPIC    EKG EKG Interpretation  Date/Time:  Sunday May 11 2022 23:48:48 EDT Ventricular Rate:  84 PR Interval:  185 QRS Duration: 92 QT Interval:  366 QTC Calculation: 433 R Axis:   64 Text Interpretation: Sinus rhythm Normal ECG Confirmed by Veryl Speak [phone]) on 05/12/2022 12:01:28 AM  Radiology No results found.  Procedures Procedures    Medications Ordered in ED Medications  sodium chloride 0.9 % bolus 1,000 mL (has no administration in time range)  ondansetron (ZOFRAN) injection 4 mg (has no administration in time range)    ED Course/ Medical Decision Making/ A&P  Patient is an 81 year old male with history of coronary artery disease, hyperlipidemia presenting with complaints of nausea, vomiting, and upper abdominal pain.  Patient arrives here afebrile and clinically well-appearing.  Physical examination reveals tenderness in the epigastric region and both upper quadrants with no peritoneal signs.  Work-up initiated including CBC, CMP, and lipase.  This was remarkable for a white count of 14,000 along with a lipase of 533.  His LFTs and electrolytes are otherwise unremarkable.  Urinalysis consistent with UTI.  CT scan of the abdomen and pelvis was obtained showing acute edematous/interstitial pancreatitis without evidence for  necrosis or fluid collection.  Patient was given IV fluids, but declined pain medication.  He was also given Rocephin for the urinary tract infection.  Care discussed with Dr. Ernestina Patches from the hospitalist service.  He will see and determine the final disposition.  Anticipate admission.  Final Clinical Impression(s) / ED Diagnoses Final diagnoses:  None    Rx / DC Orders ED Discharge Orders     None         Veryl Speak, MD 05/12/22 [id number]

## 2022-05-12 NOTE — Progress Notes (Signed)
Care started prior to midnight in the emergency room and patient was admitted early this morning after midnight by Dr. Shanda Howells and I am in current agreement with his assessment and plan.  Additional changes to the plan of care been made accordingly.  The patient is an obese elderly 81 year old Caucasian male with a past medical history significant for but not limited to asthma, hyperlipidemia, history of nonocclusive coronary artery disease, hyperlipidemia, anterior uveitis with HLA B51 positivity who was recently started on azathioprine who presents with significant abdominal pain and nausea vomiting with associated pancreatitis.  He reports about 24 hours of progressive nausea and vomiting with decreased p.o. intake and generalized abdominal pain.  He states that he attempted to eat with his granddaughter for her birthday party but then had actively nausea and vomiting and minimal p.o. intake the last few days.  He states that the episodes of vomiting were nonbilious and nonbloody and remote episode like this back in May and does eat some fatty foods.  He will occasionally also drinks 1-2 shots of bourbon and denies any heavy alcohol use and states he is fairly active and quit smoking.  Denies any recent significant changes apart from being started on Imuran for his anterior uveitis and he has been followed ophthalmologically at [facility].  He presented to the ED and white count was 14,000 lipase and 500 T. bili was elevated.  Urinalysis indicated with infection and CT of the abdomen pelvis was indicated pancreatitis.  He is admitted and currently is being treated for the following but not limited to:  * Acute Pancreatitis Hyperbilirubinemia  -Acute abdominal pain w/ nausea and vomiting x 1-2 days  Lipase 533  -CT A&P.  Consistent with pancreatitis -Patient does report some occasional alcohol use as well as occasional fatty meals -Was noted to be recent started on Imuran for?  Inflammatory eye  disease by [facility] -We will check triglyceride levels as well as alcohol level -T. bili mildly elevated at 1.5 and now trended down to 1.1 -We will check right upper quadrant ultrasound ?  if Imuran confounder as pancreatitis is one of the adverse reactions from medication -IV fluids -Antiemetics -N.p.o. and advance diet to FULL Liquid Diet  -C/w Pain control -Continue monitor and trend   Coronary artery disease, non-occlusive No active chest pain Continue home regimen    UTI (urinary tract infection) -Positive increased urinary frequency with urinalysis indicative of infection and white count of 14 -IV Rocephin initiated -Urine culture unfortunately not done yet but this will be done after after patient has been on antibiotics -To monitor and trend   Hepatitis A antibody positive -Patient noted to be hepatitis A IgG and IgM antibody positive from lab testing at [facility] on September 11 -Noted concurrent immunosuppression with Imuran in the setting of anterior uveitis?  Behet disease -LFTs are within normal limits -Given abdominal pain in setting of pancreatitis with immunosuppression we will hold his azathioprine  -GI consult as clinically indicated   Anterior uveitis -Patient currently being treated at [facility] for anterior  uveitis -Also noted to be HLA-B 51 positive with some?  Concern for behcet disease  -On azathioprine and ophthalmic prednisone among other things -Hold azathioprine for now given that this was a new medication started and has a high incidence of nausea vomiting and pancreatitis -Consider titration of medication in setting of active pancreatitis -Follow closely   Emphysema lung (HCC) -Stable from respiratory standpoint -Pending outpatient CT by  pulmonology -Follow-up for now -DuoNebs as clinically indicated   Hyperlipidemia with target LDL less than 70 -Cont rosuvastatin 20 mg p.o. Daily  Normocytic Anemia -Patient's  Hgb/Hct went from 13.7/40.5 -> 11.5/34.6 -> 12.3/36.6 -Check anemia panel in the a.m. -Continue to monitor for signs and symptoms of bleeding; no overt bleeding noted -Repeat CBC in a.m.   Obesity -Complicates overall prognosis and care -Estimated body mass index is 33 kg/m as calculated from the following:   Height as of this encounter: '5\' 10"'  (1.778 m).   Weight as of this encounter: 104.3 kg.  -Weight Loss and Dietary Counseling given  We will continue to monitor patient's clinical response to intervention and repeat blood work in the a.m. and repeat a lipase level.  We will slowly advance his diet and hold off on his Imuran given no association with pancreatitis.

## 2022-05-12 NOTE — Assessment & Plan Note (Signed)
No active chest pain Continue home regimen

## 2022-05-12 NOTE — Assessment & Plan Note (Signed)
Patient noted to be hepatitis A IgG and IgM antibody positive from lab testing at [facility] on September 11 Noted concurrent immunosuppression with Imuran in the setting of anterior uveitis?  Behet disease LFTs are within normal limits Given abdominal pain in setting of pancreatitis with immunosuppression, will repeat x1 GI consult as clinically indicated

## 2022-05-12 NOTE — Assessment & Plan Note (Signed)
Cont statin

## 2022-05-12 NOTE — Assessment & Plan Note (Signed)
Positive increased urinary frequency with urinalysis indicative of infection and white count of 14 IV Rocephin Urine culture Follow

## 2022-05-12 NOTE — Assessment & Plan Note (Addendum)
Patient currently being treated at [facility] for anterior  uveitis Also noted to be HLA-B 51 positive with some?  Concern for behcet disease  On azathioprine and ophthalmic prednisone among other things Continue azathioprine for now Consider titration of medication in setting of active pancreatitis Follow closely

## 2022-05-12 NOTE — Assessment & Plan Note (Signed)
Stable from respiratory standpoint Pending outpatient CT by pulmonology Follow-up for now  DuoNebs as clinically indicated

## 2022-05-13 DIAGNOSIS — Z23 Encounter for immunization: Secondary | ICD-10-CM | POA: Diagnosis present

## 2022-05-13 DIAGNOSIS — Z79899 Other long term (current) drug therapy: Secondary | ICD-10-CM | POA: Diagnosis not present

## 2022-05-13 DIAGNOSIS — I2510 Atherosclerotic heart disease of native coronary artery without angina pectoris: Secondary | ICD-10-CM | POA: Diagnosis present

## 2022-05-13 DIAGNOSIS — H209 Unspecified iridocyclitis: Secondary | ICD-10-CM | POA: Diagnosis present

## 2022-05-13 DIAGNOSIS — T451X5A Adverse effect of antineoplastic and immunosuppressive drugs, initial encounter: Secondary | ICD-10-CM | POA: Diagnosis present

## 2022-05-13 DIAGNOSIS — Z6833 Body mass index (BMI) 33.0-33.9, adult: Secondary | ICD-10-CM | POA: Diagnosis not present

## 2022-05-13 DIAGNOSIS — J45909 Unspecified asthma, uncomplicated: Secondary | ICD-10-CM | POA: Diagnosis present

## 2022-05-13 DIAGNOSIS — K859 Acute pancreatitis without necrosis or infection, unspecified: Secondary | ICD-10-CM | POA: Diagnosis present

## 2022-05-13 DIAGNOSIS — K853 Drug induced acute pancreatitis without necrosis or infection: Secondary | ICD-10-CM | POA: Diagnosis present

## 2022-05-13 DIAGNOSIS — J439 Emphysema, unspecified: Secondary | ICD-10-CM | POA: Diagnosis present

## 2022-05-13 DIAGNOSIS — Z79620 Long term (current) use of immunosuppressive biologic: Secondary | ICD-10-CM | POA: Diagnosis not present

## 2022-05-13 DIAGNOSIS — D649 Anemia, unspecified: Secondary | ICD-10-CM | POA: Diagnosis present

## 2022-05-13 DIAGNOSIS — Z86010 Personal history of colonic polyps: Secondary | ICD-10-CM | POA: Diagnosis not present

## 2022-05-13 DIAGNOSIS — Z87891 Personal history of nicotine dependence: Secondary | ICD-10-CM | POA: Diagnosis not present

## 2022-05-13 DIAGNOSIS — Z7982 Long term (current) use of aspirin: Secondary | ICD-10-CM | POA: Diagnosis not present

## 2022-05-13 DIAGNOSIS — Z888 Allergy status to other drugs, medicaments and biological substances status: Secondary | ICD-10-CM | POA: Diagnosis not present

## 2022-05-13 DIAGNOSIS — Z8249 Family history of ischemic heart disease and other diseases of the circulatory system: Secondary | ICD-10-CM | POA: Diagnosis not present

## 2022-05-13 DIAGNOSIS — N3000 Acute cystitis without hematuria: Secondary | ICD-10-CM | POA: Diagnosis not present

## 2022-05-13 DIAGNOSIS — R760 Raised antibody titer: Secondary | ICD-10-CM | POA: Diagnosis present

## 2022-05-13 DIAGNOSIS — R17 Unspecified jaundice: Secondary | ICD-10-CM | POA: Diagnosis present

## 2022-05-13 DIAGNOSIS — K5730 Diverticulosis of large intestine without perforation or abscess without bleeding: Secondary | ICD-10-CM | POA: Diagnosis present

## 2022-05-13 DIAGNOSIS — E785 Hyperlipidemia, unspecified: Secondary | ICD-10-CM | POA: Diagnosis present

## 2022-05-13 DIAGNOSIS — N390 Urinary tract infection, site not specified: Secondary | ICD-10-CM | POA: Diagnosis present

## 2022-05-13 DIAGNOSIS — Z91048 Other nonmedicinal substance allergy status: Secondary | ICD-10-CM | POA: Diagnosis not present

## 2022-05-13 DIAGNOSIS — E669 Obesity, unspecified: Secondary | ICD-10-CM | POA: Diagnosis present

## 2022-05-13 LAB — CBC WITH DIFFERENTIAL/PLATELET
Abs Immature Granulocytes: 0.06 10*3/uL (ref 0.00–0.07)
Basophils Absolute: 0 10*3/uL (ref 0.0–0.1)
Basophils Relative: 0 %
Eosinophils Absolute: 0.3 10*3/uL (ref 0.0–0.5)
Eosinophils Relative: 3 %
HCT: 37.1 % — ABNORMAL LOW (ref 39.0–52.0)
Hemoglobin: 11.8 g/dL — ABNORMAL LOW (ref 13.0–17.0)
Immature Granulocytes: 1 %
Lymphocytes Relative: 13 %
Lymphs Abs: 1.6 10*3/uL (ref 0.7–4.0)
MCH: 30.3 pg (ref 26.0–34.0)
MCHC: 31.8 g/dL (ref 30.0–36.0)
MCV: 95.1 fL (ref 80.0–100.0)
Monocytes Absolute: 0.6 10*3/uL (ref 0.1–1.0)
Monocytes Relative: 5 %
Neutro Abs: 9.7 10*3/uL — ABNORMAL HIGH (ref 1.7–7.7)
Neutrophils Relative %: 78 %
Platelets: 207 10*3/uL (ref 150–400)
RBC: 3.9 MIL/uL — ABNORMAL LOW (ref 4.22–5.81)
RDW: 13.6 % (ref 11.5–15.5)
WBC: 12.4 10*3/uL — ABNORMAL HIGH (ref 4.0–10.5)
nRBC: 0 % (ref 0.0–0.2)

## 2022-05-13 LAB — COMPREHENSIVE METABOLIC PANEL
ALT: 29 U/L (ref 0–44)
AST: 30 U/L (ref 15–41)
Albumin: 3.3 g/dL — ABNORMAL LOW (ref 3.5–5.0)
Alkaline Phosphatase: 43 U/L (ref 38–126)
Anion gap: 7 (ref 5–15)
BUN: 16 mg/dL (ref 8–23)
CO2: 25 mmol/L (ref 22–32)
Calcium: 8.6 mg/dL — ABNORMAL LOW (ref 8.9–10.3)
Chloride: 107 mmol/L (ref 98–111)
Creatinine, Ser: 0.93 mg/dL (ref 0.61–1.24)
GFR, Estimated: >60 mL/min (ref >60)
Glucose, Bld: 130 mg/dL — ABNORMAL HIGH (ref 70–99)
Potassium: 3.9 mmol/L (ref 3.5–5.1)
Sodium: 139 mmol/L (ref 135–145)
Total Bilirubin: 0.9 mg/dL (ref 0.3–1.2)
Total Protein: 6.7 g/dL (ref 6.5–8.1)

## 2022-05-13 LAB — MAGNESIUM: Magnesium: 2.2 mg/dL (ref 1.7–2.4)

## 2022-05-13 LAB — LIPASE, BLOOD
Lipase: 278 U/L — ABNORMAL HIGH (ref 11–51)
Lipase: 533 U/L — ABNORMAL HIGH (ref 11–51)

## 2022-05-13 LAB — PHOSPHORUS: Phosphorus: 2 mg/dL — ABNORMAL LOW (ref 2.5–4.6)

## 2022-05-13 MED ORDER — K PHOS MONO-SOD PHOS DI & MONO 155-852-130 MG PO TABS
500.0000 mg | ORAL_TABLET | Freq: Two times a day (BID) | ORAL | Status: AC
  Administered 2022-05-13 (×2): 500 mg via ORAL
  Filled 2022-05-13 (×2): qty 2

## 2022-05-13 NOTE — Progress Notes (Signed)
Mobility Specialist - Progress Note   05/13/22 0900  Mobility  Activity Ambulated independently in hallway  Level of Assistance Independent  Assistive Device None  Distance Ambulated (ft) 700 ft  Range of Motion/Exercises Active  Activity Response Tolerated well  $Mobility charge 1 Mobility   Pt was found on recliner chair and agreeable to ambulate. Had no complaints and at EOS returned to recliner chair with all necessities in reach.  Ferd Hibbs Mobility Specialist

## 2022-05-13 NOTE — TOC Progression Note (Signed)
Transition of Care [facility]) - Progression Note    Patient Details  Name: Daniel Hampton MRN: [id number] Date of Birth: 1941-07-26  Transition of Care [facility]) CM/SW Contact  Purcell Mouton, RN Phone Number: 05/13/2022, 8:58 AM  Clinical Narrative:      Transition of Care (TOC) Screening Note   Patient Details  Name: Daniel Hampton Date of Birth: [birth year]   Transition of Care [facility]) CM/SW Contact:    Purcell Mouton, RN Phone Number: 05/13/2022, 8:58 AM    Transition of Care Department [facility]) has reviewed patient and no TOC needs have been identified at this time. We will continue to monitor patient advancement through interdisciplinary progression rounds. If new patient transition needs arise, please place a TOC consult.         Expected Discharge Plan and Services                                                 Social Determinants of Health (SDOH) Interventions    Readmission Risk Interventions     No data to display

## 2022-05-13 NOTE — Progress Notes (Signed)
PROGRESS NOTE    Daniel Hampton  BPZ:[id number] DOB: 11-11-1940 DOA: 05/11/2022 PCP: [name], MD   Brief Narrative:  The patient is an obese elderly 81 year old Caucasian male with a past medical history significant for but not limited to asthma, hyperlipidemia, history of nonocclusive coronary artery disease, hyperlipidemia, anterior uveitis with HLA B51 positivity who was recently started on azathioprine who presents with significant abdominal pain and nausea vomiting with associated pancreatitis.  He reports about 24 hours of progressive nausea and vomiting with decreased p.o. intake and generalized abdominal pain.  He states that he attempted to eat with his granddaughter for her birthday party but then had actively nausea and vomiting and minimal p.o. intake the last few days.  He states that the episodes of vomiting were nonbilious and nonbloody and remote episode like this back in May and does eat some fatty foods.  He will occasionally also drinks 1-2 shots of bourbon and denies any heavy alcohol use and states he is fairly active and quit smoking.  Denies any recent significant changes apart from being started on Imuran for his anterior uveitis and he has been followed ophthalmologically at [facility].  He presented to the ED and white count was 14,000 lipase and 500 T. bili was elevated.  Urinalysis indicated with infection and CT of the abdomen pelvis was indicated pancreatitis  Lipase is trending down and he is improving but will remain on a full liquid diet and he had a temperature overnight so we will need to monitor carefully.  Assessment and Plan:  Acute Pancreatitis, improving slightly  Hyperbilirubinemia  -Acute abdominal pain w/ nausea and vomiting x 1-2 days  Lipase 533 on admission and trended down to 325 yesterday is now 278 -CT A&P.  Consistent with pancreatitis -Patient does report some occasional alcohol use as well as occasional fatty meals -Was noted to be recent  started on Imuran for?  Inflammatory eye disease by [facility] -We will check triglyceride levels as well as alcohol level -T. bili mildly elevated at 1.5 and now trended down to 1.1 yesterday and today is 0.9 -We will check right upper quadrant ultrasound; and right upper quadrant ultrasound done and showed "No acute abnormalities. No evidence of cholelithiasis or acute cholecystitis." -?  if Imuran confounder as pancreatitis is one of the adverse reactions from medication but this is the likely culprit -IV fluids being continued -Antiemetics -N.p.o. and advance diet to FULL Liquid Diet and will continue for liquid diet for today and go to soft tomorrow -C/w Pain control -Continue monitor and trend and advance diet as tolerated  Fever -Had a temperature of 100.4 yesterday overnight -Likely in the setting of UTI versus as above with pancreatitis -On antibiotics with IV ceftriaxone -If continues to spike temperatures will need to panculture and continue to monitor closely -Repeat CBC in the morning and will follow temperature curve and white blood cell count trend   Coronary artery disease, non-occlusive -No active chest pain -Continue home regimen    UTI (urinary tract infection) -Positive increased urinary frequency with urinalysis indicative of infection and white count of 14 -Patient did spike a temperature of 100.4 last night -IV Rocephin initiated and will continue -Urine culture unfortunately not done yet but this will be done after after patient has been on antibiotics and still not sent off -Continue IV antibiotics for now continue monitor and trend   Hepatitis A antibody positive -Patient noted to be hepatitis A IgG and IgM antibody positive  from lab testing at [facility] on September 11 -Noted concurrent immunosuppression with Imuran in the setting of anterior uveitis?  Behet disease -LFTs are within normal limits -Given abdominal pain in setting of  pancreatitis with immunosuppression we will hold his azathioprine  -May need further discussion with GI but his repeat hepatitis panel here is negative   Anterior uveitis -Patient currently being treated at [facility] for anterior  uveitis -Also noted to be HLA-B 51 positive with some?  Concern for behcet disease  -On azathioprine and ophthalmic prednisone among other things -Hold azathioprine for now given that this was a new medication started and has a high incidence of nausea vomiting and pancreatitis -Consider titration of medication in setting of active pancreatitis and will need further evaluation at [facility] -Follow closely   Emphysema lung ([place]) -Stable from respiratory standpoint -Pending outpatient CT by pulmonology -Follow-up for now -DuoNebs as clinically indicated   Hyperlipidemia with target LDL less than 70 -Cont rosuvastatin 20 mg p.o. Daily   Normocytic Anemia -Patient's Hgb/Hct went from 13.7/40.5 -> 11.5/34.6 -> 12.3/36.6 and is now 11.8/37.1 -Check anemia panel in the a.m. -Continue to monitor for signs and symptoms of bleeding; no overt bleeding noted -Repeat CBC in a.m.   Obesity -Complicates overall prognosis and care -Estimated body mass index is 33 kg/m as calculated from the following:   Height as of this encounter: '5\' 10"'  (1.778 m).   Weight as of this encounter: 104.3 kg.  -Weight Loss and Dietary Counseling given  DVT prophylaxis: enoxaparin (LOVENOX) injection 40 mg Start: 05/12/22 1000 SCDs Start: 05/12/22 0255    Code Status: Full Code Family Communication: Discussed with patient's brother at bedside  Disposition Plan:  Level of care: Med-Surg Status is: Inpatient Remains inpatient appropriate because: Improving however spiked a temperature overnight and will need to advance diet to soft likely in the morning if tolerates FULL throughout the day today   Consultants:  None  Procedures:  None  Antimicrobials:   Anti-infectives (From admission, onward)    Start     Dose/Rate Route Frequency Ordered Stop   05/12/22 2200  cefTRIAXone (ROCEPHIN) 1 g in sodium chloride 0.9 % 100 mL IVPB        1 g 200 mL/hr over 30 Minutes Intravenous Every 24 hours 05/12/22 1220     05/12/22 0230  cefTRIAXone (ROCEPHIN) 1 g in sodium chloride 0.9 % 100 mL IVPB        1 g 200 mL/hr over 30 Minutes Intravenous  Once 05/12/22 0226 05/12/22 0314        Subjective: And examined at bedside and was still having some slight abdominal discomfort but was doing better.  No nausea or vomiting.  Had a temperature overnight and stated that he had woke up in the bed was soaked.  No other concerns or complaints at this time.  Objective: Vitals:   05/13/22 0533 05/13/22 0949 05/13/22 1236 05/13/22 1628  BP: 119/83 112/61 (!) 104/59 135/75  Pulse: 74 65 70 71  Resp: '18 17 17 17  ' Temp: 99.3 F (37.4 C) 98.5 F (36.9 C) 98.9 F (37.2 C) 98.2 F (36.8 C)  TempSrc: Oral     SpO2: 96% 96% 97% 97%  Weight:      Height:        Intake/Output Summary (Last 24 hours) at 05/13/2022 1723 Last data filed at 05/13/2022 1650 Gross per 24 hour  Intake 3798.31 ml  Output 2125 ml  Net 1673.31 ml  Filed Weights   05/12/22 0820  Weight: 104.3 kg   Examination: Physical Exam:  Constitutional: WN/WD obese Caucasian male currently no acute distress.  Respiratory: Diminished to auscultation bilaterally, no wheezing, rales, rhonchi or crackles. Normal respiratory effort and patient is not tachypenic. No accessory muscle use.  Unlabored breathing Cardiovascular: RRR, no murmurs / rubs / gallops. S1 and S2 auscultated. No extremity edema.  Abdomen: Soft, slightly-tender distended secondary body habitus. Bowel sounds positive.  GU: Deferred. Musculoskeletal: No clubbing / cyanosis of digits/nails. No joint deformity upper and lower extremities Skin: No rashes, lesions, ulcers on limited skin evaluation. No induration; Warm and dry.   Neurologic: CN 2-12 grossly intact with no focal deficits. Romberg sign and cerebellar reflexes not assessed.  Psychiatric: Normal judgment and insight. Alert and oriented x 3. Normal mood and appropriate affect.   Data Reviewed: I have personally reviewed following labs and imaging studies  CBC: Recent Labs  Lab 05/11/22 2338 05/12/22 0429 05/12/22 1033 05/13/22 0422  WBC 13.9* 16.1* 15.1* 12.4*  NEUTROABS  --   --   --  9.7*  HGB 13.7 11.5* 12.3* 11.8*  HCT 40.5 34.6* 36.6* 37.1*  MCV 90.6 91.8 91.5 95.1  PLT 228 195 215 915   Basic Metabolic Panel: Recent Labs  Lab 05/11/22 2338 05/12/22 0400 05/12/22 0923 05/13/22 0422  NA 138  --  138 139  K 4.0  --  4.1 3.9  CL 103  --  105 107  CO2 24  --  23 25  GLUCOSE 188*  --  147* 130*  BUN 18  --  16 16  CREATININE 1.04 1.03 1.03 0.93  CALCIUM 9.3  --  8.7* 8.6*  MG  --   --  1.9 2.2  PHOS  --   --  3.8 2.0*   GFR: Estimated Creatinine Clearance: 74.1 mL/min (by C-G formula based on SCr of 0.93 mg/dL). Liver Function Tests: Recent Labs  Lab 05/11/22 2338 05/12/22 0923 05/13/22 0422  AST 40 31 30  ALT 43 35 29  ALKPHOS 50 43 43  BILITOT 1.5* 1.1 0.9  PROT 7.8 6.9 6.7  ALBUMIN 4.2 3.6 3.3*   Recent Labs  Lab 05/11/22 2338 05/12/22 0923 05/13/22 0422  LIPASE 533* 325* 278*   No results for input(s): "AMMONIA" in the last 168 hours. Coagulation Profile: No results for input(s): "INR", "PROTIME" in the last 168 hours. Cardiac Enzymes: No results for input(s): "CKTOTAL", "CKMB", "CKMBINDEX", "TROPONINI" in the last 168 hours. BNP (last 3 results) No results for input(s): "PROBNP" in the last 8760 hours. HbA1C: No results for input(s): "HGBA1C" in the last 72 hours. CBG: Recent Labs  Lab 05/11/22 2338  GLUCAP 178*   Lipid Profile: Recent Labs    05/12/22 0259  TRIG 63   Thyroid Function Tests: No results for input(s): "TSH", "T4TOTAL", "FREET4", "T3FREE", "THYROIDAB" in the last 72  hours. Anemia Panel: No results for input(s): "VITAMINB12", "FOLATE", "FERRITIN", "TIBC", "IRON", "RETICCTPCT" in the last 72 hours. Sepsis Labs: No results for input(s): "PROCALCITON", "LATICACIDVEN" in the last 168 hours.  No results found for this or any previous visit (from the past 240 hour(s)).   Radiology Studies: US Abdomen Limited RUQ (LIVER/GB)  Result Date: 05/12/2022 CLINICAL DATA:  Pancreatitis EXAM: ULTRASOUND ABDOMEN LIMITED RIGHT UPPER QUADRANT COMPARISON:  CT 05/12/2022 FINDINGS: Gallbladder: No gallstones or wall thickening visualized. No sonographic Murphy sign noted by sonographer. Common bile duct: Diameter: 3 mm, normal Liver: Normal parenchymal echotexture. Simple appearing cyst in  the right lobe of the liver measuring 2.2 cm maximal diameter. No imaging follow-up is indicated. Portal vein is patent on color Doppler imaging with normal direction of blood flow towards the liver. Other: None. IMPRESSION: No acute abnormalities. No evidence of cholelithiasis or acute cholecystitis. Electronically Signed   By: Lucienne Capers M.D.   On: 05/12/2022 03:49   CT ABDOMEN PELVIS W CONTRAST  Result Date: 05/12/2022 CLINICAL DATA:  Abdominal pain, acute, nonlocalized. Nausea, vomiting. EXAM: CT ABDOMEN AND PELVIS WITH CONTRAST TECHNIQUE: Multidetector CT imaging of the abdomen and pelvis was performed using the standard protocol following bolus administration of intravenous contrast. RADIATION DOSE REDUCTION: This exam was performed according to the departmental dose-optimization program which includes automated exposure control, adjustment of the mA and/or kV according to patient size and/or use of iterative reconstruction technique. CONTRAST:  121m OMNIPAQUE IOHEXOL 300 MG/ML  SOLN COMPARISON:  MRI 10/27/2018 FINDINGS: Lower chest: Mild bibasilar atelectasis. Extensive coronary artery calcification. Global cardiac size within normal limits. Hepatobiliary: Multiple simple cysts are again  seen scattered throughout the liver. No enhancing intrahepatic mass. No intra or extrahepatic biliary ductal dilation. Gallbladder unremarkable. Pancreas: Moderate peripancreatic inflammatory stranding is present keeping with changes of acute edematous/interstitial pancreatitis. Normal enhancement of the pancreatic parenchyma; no evidence of pancreatic or peripancreatic necrosis. No loculated peripancreatic fluid collections. The pancreatic duct is not dilated. Spleen: Normal in size without focal abnormality. Adrenals/Urinary Tract: The adrenal glands are unremarkable. Multiple cystic lesions are again identified scattered throughout the kidneys bilaterally previously characterized as simple and hemorrhagic/proteinaceous renal cysts. No follow-up imaging is recommended for these lesions. No enhancing intrarenal masses are seen. The kidneys are otherwise unremarkable. The bladder is unremarkable. Stomach/Bowel: Severe descending and sigmoid colonic diverticulosis without superimposed acute inflammatory change. The stomach, small bowel, and large bowel are otherwise unremarkable. Appendix normal. No free intraperitoneal gas or fluid. Vascular/Lymphatic: Extensive aortoiliac atherosclerotic calcification. No aortic aneurysm. The abdominal vasculature is otherwise unremarkable. Specifically, the splenic vein, superior mesenteric vein, and portal vein are patent. No pathologic adenopathy within the abdomen and pelvis. Reproductive: Mild prostatic hypertrophy. Other: Tiny fat containing left inguinal hernia. Musculoskeletal: No acute bone abnormality. No lytic or blastic bone lesion. Osseous structures are age-appropriate. IMPRESSION: 1. Acute edematous/interstitial pancreatitis. No evidence of pancreatic or peripancreatic necrosis. No loculated peripancreatic fluid collections. 2. Extensive coronary artery calcification. 3. Severe distal colonic diverticulosis without superimposed acute inflammatory change. Aortic  Atherosclerosis (ICD10-I70.0). Electronically Signed   By: AFidela SalisburyM.D.   On: 05/12/2022 01:40    Scheduled Meds:  aspirin EC  81 mg Oral Daily   dorzolamide-timolol  1 drop Right Eye BID   enoxaparin (LOVENOX) injection  40 mg Subcutaneous Q24H   famotidine  20 mg Oral BID   multivitamin with minerals  1 tablet Oral Daily   phosphorus  500 mg Oral BID   prednisoLONE acetate  1 drop Right Eye Daily   rosuvastatin  20 mg Oral Daily   tamsulosin  0.4 mg Oral Daily   Continuous Infusions:  sodium chloride 75 mL/hr at 05/13/22 0530   cefTRIAXone (ROCEPHIN)  IV 1 g (05/12/22 2121)    LOS: 0 days   ORaiford Noble DO [facility] Available via Epic secure chat 7am-7pm After these hours, please refer to coverage provider listed on [email or web address] 05/13/2022, 5:23 PM

## 2022-05-14 DIAGNOSIS — N390 Urinary tract infection, site not specified: Secondary | ICD-10-CM | POA: Diagnosis not present

## 2022-05-14 DIAGNOSIS — J439 Emphysema, unspecified: Secondary | ICD-10-CM | POA: Diagnosis not present

## 2022-05-14 DIAGNOSIS — K859 Acute pancreatitis without necrosis or infection, unspecified: Secondary | ICD-10-CM | POA: Diagnosis not present

## 2022-05-14 LAB — CBC WITH DIFFERENTIAL/PLATELET
Abs Immature Granulocytes: 0.04 10*3/uL (ref 0.00–0.07)
Basophils Absolute: 0 10*3/uL (ref 0.0–0.1)
Basophils Relative: 0 %
Eosinophils Absolute: 0.3 10*3/uL (ref 0.0–0.5)
Eosinophils Relative: 4 %
HCT: 33.7 % — ABNORMAL LOW (ref 39.0–52.0)
Hemoglobin: 11.3 g/dL — ABNORMAL LOW (ref 13.0–17.0)
Immature Granulocytes: 1 %
Lymphocytes Relative: 24 %
Lymphs Abs: 2 10*3/uL (ref 0.7–4.0)
MCH: 30.8 pg (ref 26.0–34.0)
MCHC: 33.5 g/dL (ref 30.0–36.0)
MCV: 91.8 fL (ref 80.0–100.0)
Monocytes Absolute: 0.5 10*3/uL (ref 0.1–1.0)
Monocytes Relative: 6 %
Neutro Abs: 5.3 10*3/uL (ref 1.7–7.7)
Neutrophils Relative %: 65 %
Platelets: 209 10*3/uL (ref 150–400)
RBC: 3.67 MIL/uL — ABNORMAL LOW (ref 4.22–5.81)
RDW: 13.2 % (ref 11.5–15.5)
WBC: 8.2 10*3/uL (ref 4.0–10.5)
nRBC: 0 % (ref 0.0–0.2)

## 2022-05-14 LAB — MAGNESIUM: Magnesium: 2.3 mg/dL (ref 1.7–2.4)

## 2022-05-14 LAB — COMPREHENSIVE METABOLIC PANEL
ALT: 49 U/L — ABNORMAL HIGH (ref 0–44)
AST: 48 U/L — ABNORMAL HIGH (ref 15–41)
Albumin: 3.2 g/dL — ABNORMAL LOW (ref 3.5–5.0)
Alkaline Phosphatase: 47 U/L (ref 38–126)
Anion gap: 7 (ref 5–15)
BUN: 12 mg/dL (ref 8–23)
CO2: 25 mmol/L (ref 22–32)
Calcium: 8.5 mg/dL — ABNORMAL LOW (ref 8.9–10.3)
Chloride: 109 mmol/L (ref 98–111)
Creatinine, Ser: 0.83 mg/dL (ref 0.61–1.24)
GFR, Estimated: >60 mL/min (ref >60)
Glucose, Bld: 140 mg/dL — ABNORMAL HIGH (ref 70–99)
Potassium: 3.3 mmol/L — ABNORMAL LOW (ref 3.5–5.1)
Sodium: 141 mmol/L (ref 135–145)
Total Bilirubin: 0.7 mg/dL (ref 0.3–1.2)
Total Protein: 6.4 g/dL — ABNORMAL LOW (ref 6.5–8.1)

## 2022-05-14 LAB — LIPASE, BLOOD: Lipase: 155 U/L — ABNORMAL HIGH (ref 11–51)

## 2022-05-14 LAB — PHOSPHORUS: Phosphorus: 3.4 mg/dL (ref 2.5–4.6)

## 2022-05-14 MED ORDER — POTASSIUM CHLORIDE CRYS ER 20 MEQ PO TBCR
40.0000 meq | EXTENDED_RELEASE_TABLET | Freq: Once | ORAL | Status: AC
  Administered 2022-05-14: 40 meq via ORAL
  Filled 2022-05-14: qty 2

## 2022-05-14 NOTE — Progress Notes (Signed)
  Progress Note   Patient: Daniel Hampton UUE:[id number] DOB: 1941-02-15 DOA: 05/11/2022     1 DOS: the patient was seen and examined on 05/14/2022   Brief hospital course:  Assessment and Plan: Acute pancreatitis  --Recent start on Imuran for inflammatory eye disease by [facility], thought to be etiology --clinically improved, diet advanced, it tolerates, likely home 10/12.   UTI (urinary tract infection) --complete short course of antibioti   Hepatitis A antibody positive --hepatitis A IgG and IgM antibody positive from lab testing at [facility] on September 11 -May need further discussion with GI but his repeat hepatitis panel here is negative   Anterior uveitis --Patient currently being treated at [facility] for anterior uveitis with azathioprine and ophthalmic prednisone  --Also noted to be HLA-B 51 positive with some concern for Behcet disease  -Hold azathioprine for now given that this was a new medication started and has a high incidence of nausea vomiting and pancreatitis --close outpatient follow-up   Emphysema lung ([facility]) --Stable   Normocytic Anemia --stable   Obesity --Body mass index is 33 kg/m.  Coronary artery disease, non-occlusive -No active chest pain -Continue home regimen  Improving, advance die. Likely home tomorrow      Subjective:  Feels better Tolerating liquids No pain  Physical Exam: Vitals:   05/13/22 1628 05/14/22 0511 05/14/22 1004 05/14/22 1323  BP: 135/75 136/74 114/69 124/72  Pulse: 71 67 (!) 55 (!) 57  Resp: $Remo'17 18 14 17  'hGvBR$ Temp: 98.2 F (36.8 C) 98.3 F (36.8 C) 98.2 F (36.8 C) 97.8 F (36.6 C)  TempSrc:  Oral Oral Oral  SpO2: 97% 97% 95% 98%  Weight:      Height:       Physical Exam Vitals reviewed.  Constitutional:      General: He is not in acute distress.    Appearance: He is not ill-appearing or toxic-appearing.  Cardiovascular:     Rate and Rhythm: Normal rate and regular rhythm.      Heart sounds: No murmur heard. Pulmonary:     Effort: Pulmonary effort is normal. No respiratory distress.     Breath sounds: No wheezing or rales.  Abdominal:     Palpations: Abdomen is soft.  Neurological:     Mental Status: He is alert.  Psychiatric:        Behavior: Behavior normal.     Data Reviewed:  K+ 3.3 Lipase down to 155 AST up to 48 ALT up to 49 CBC noted  Family Communication: wife at bedside  Disposition: Status is: Inpatient Remains inpatient appropriate because: acute pancreatitis   Planned Discharge Destination: Home    Time spent: 25 minutes  Author: [name], MD 05/14/2022 2:13 PM  For on call review [email or web address].

## 2022-05-14 NOTE — Progress Notes (Signed)
Mobility Specialist - Progress Note   05/14/22 0955  Mobility  Activity Ambulated independently in hallway  Level of Assistance Independent  Assistive Device None  Distance Ambulated (ft) 700 ft  Range of Motion/Exercises Active  Activity Response Tolerated well  $Mobility charge 1 Mobility   Pt was found on recliner chair and agreeable to ambulate. Had no complaints and at EOS returned to recliner chair with all necessities in reach.  Ferd Hibbs Mobility Specialist

## 2022-05-15 DIAGNOSIS — N3000 Acute cystitis without hematuria: Secondary | ICD-10-CM

## 2022-05-15 LAB — URINE CULTURE: Culture: >=100000 — AB

## 2022-05-15 NOTE — Discharge Summary (Addendum)
Physician Discharge Summary   Patient: Daniel Hampton MRN: [id number] DOB: [birth year]  Admit date:     05/11/2022  Discharge date: 05/15/22  Discharge Physician: [name]   PCP: [name], MD   Recommendations at discharge:   Resolution of pancreatitis. Imuran stopped, likely cause .  Will communicate with ophthalmology discontinuation of Imuran.  Hepatitis A antibody positive --hepatitis A IgG and IgM antibody positive from lab testing at [facility] on September 11 -May need further discussion with GI but his repeat hepatitis panel here is negative.  Follow-up as an outpatient.   Anterior uveitis  Discharge Diagnoses: Principal Problem:   Pancreatitis Active Problems:   Coronary artery disease, non-occlusive   UTI (urinary tract infection)   Hepatitis A antibody positive   Hyperlipidemia with target LDL less than 70   Emphysema lung (HCC)   Anterior uveitis  Hospital Course: 81 year old man complicated PMH including anterior uveitis recently started on Imuran, presented for abdominal pain.  Admitted for acute pancreatitis secondary to Imuran.   Acute pancreatitis  --Recent start on Imuran for inflammatory eye disease by [name], thought to be etiology --clinically improved, diet advanced, stable for discharge home.  Will communicate with ophthalmology discontinuation of Imuran.   UTI (urinary tract infection) -- Pleated antibiotics.   Hepatitis A antibody positive --hepatitis A IgG and IgM antibody positive from lab testing at [name] on September 11 -May need further discussion with GI but his repeat hepatitis panel here is negative.  Follow-up as an outpatient.   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   Coronary artery disease, non-occlusive -No active chest pain -Continue home regimen  Consultants:  None  Procedures performed:  None   Disposition: Home Diet recommendation:  Low fat diet for 1 week, then advance  DISCHARGE MEDICATION: Allergies as of 05/15/2022       Reactions   Atorvastatin Other (See Comments)   myalgia   Crestor [rosuvastatin] Other (See Comments)   Too high of a dose makes his muscles ache   Tape Rash        Medication List     STOP taking these medications    albuterol 108 (90 Base) MCG/ACT inhaler Commonly known as: VENTOLIN HFA   azaTHIOprine 50 MG tablet Commonly known as: IMURAN   benzonatate 200 MG capsule Commonly known as: TESSALON   metoprolol tartrate 25 MG tablet Commonly known as: LOPRESSOR   METRONIDAZOLE (TOPICAL) 0.75 % Lotn       TAKE these medications    ALIVE MULTI-VITAMIN PO Take 1 tablet by mouth daily.   aspirin EC 81 MG tablet Take 81 mg by mouth daily. Swallow whole.   cetirizine 10 MG tablet Commonly known as: ZYRTEC Take 1 tablet (10 mg total) by mouth daily.   COQ-10 PO Take 1 tablet by mouth daily.   dorzolamide-timolol 2-0.5 % ophthalmic solution Commonly known as: COSOPT Place 1 drop into the right eye 2 (two) times daily.   famotidine 20 MG tablet Commonly known as: PEPCID Take 20 mg by mouth 2 (two)  times daily.   fluticasone 50 MCG/ACT nasal spray Commonly known as: FLONASE Place 2 sprays into both nostrils daily as needed for allergies or rhinitis.   prednisoLONE acetate 1 % ophthalmic suspension Commonly known as: PRED FORTE Place 1 drop into the right eye daily.   Repatha SureClick 532 MG/ML Soaj Generic drug: Evolocumab Inject 1 mL into the skin every 14  (fourteen) days.   rosuvastatin 40 MG tablet Commonly known as: CRESTOR Take 20 mg by mouth daily. Takes 1/2 tablet (15m)   tamsulosin 0.4 MG Caps capsule Commonly known as: FLOMAX Take 0.4 mg by mouth daily.        Follow-up Information     [name] Follow up.   Specialty: Internal Medicine Why: As needed Contact information: [phone]        SAndreas Newport MD. Schedule an appointment as soon as possible for a visit in 1 week(s).   Specialty: Ophthalmology Contact information: [place] [phone]               Feels good Tolerating diet  Discharge Exam: FDanley DankerWeights   05/12/22 0820  Weight: 104.3 kg   Physical Exam Vitals reviewed.  Constitutional:      General: He is not in acute distress.    Appearance: He is not ill-appearing or toxic-appearing.  Cardiovascular:     Rate and Rhythm: Normal rate and regular rhythm.     Heart sounds: No murmur heard. Pulmonary:     Effort: Pulmonary effort is normal. No respiratory distress.     Breath sounds: No wheezing, rhonchi or rales.  Neurological:     Mental Status: He is alert.  Psychiatric:        Mood and Affect: Mood normal.        Behavior: Behavior normal.     Condition at discharge: good  The results of significant diagnostics from this hospitalization (including imaging, microbiology, ancillary and laboratory) are listed below for reference.   Imaging Studies: UKoreaAbdomen Limited RUQ (LIVER/GB)  Result Date: 05/12/2022 CLINICAL DATA:  Pancreatitis EXAM: ULTRASOUND ABDOMEN LIMITED RIGHT UPPER QUADRANT COMPARISON:  CT 05/12/2022 FINDINGS: Gallbladder: No gallstones or wall thickening visualized. No sonographic Murphy sign noted by sonographer. Common bile duct: Diameter: 3 mm, normal Liver: Normal parenchymal echotexture. Simple appearing cyst in the right lobe of the liver measuring 2.2 cm  maximal diameter. No imaging follow-up is indicated. Portal vein is patent on color Doppler imaging with normal direction of blood flow towards the liver. Other: None. IMPRESSION: No acute abnormalities. No evidence of cholelithiasis or acute cholecystitis. Electronically Signed   By: WLucienne CapersM.D.   On: 05/12/2022 03:49   CT ABDOMEN PELVIS W CONTRAST  Result Date: 05/12/2022 CLINICAL DATA:  Abdominal pain, acute, nonlocalized. Nausea, vomiting. EXAM: CT ABDOMEN AND PELVIS WITH CONTRAST TECHNIQUE: Multidetector CT imaging of the abdomen and pelvis was performed using the standard protocol following bolus administration of intravenous contrast. RADIATION DOSE REDUCTION: This exam was performed according to the departmental dose-optimization program which includes automated exposure control, adjustment of the mA and/or kV according to patient size and/or use of iterative reconstruction technique. CONTRAST:  1051mOMNIPAQUE IOHEXOL 300 MG/ML  SOLN COMPARISON:  MRI 10/27/2018 FINDINGS: Lower chest: Mild bibasilar atelectasis. Extensive coronary artery calcification. Global cardiac size within normal limits. Hepatobiliary: Multiple simple cysts are again seen scattered throughout the liver. No enhancing intrahepatic mass. No intra  or extrahepatic biliary ductal dilation. Gallbladder unremarkable. Pancreas: Moderate peripancreatic inflammatory stranding is present keeping with changes of acute edematous/interstitial pancreatitis. Normal enhancement of the pancreatic parenchyma; no evidence of pancreatic or peripancreatic necrosis. No loculated peripancreatic fluid collections. The pancreatic duct is not dilated. Spleen: Normal in size without focal abnormality. Adrenals/Urinary Tract: The adrenal glands are unremarkable. Multiple cystic lesions are again identified scattered throughout the kidneys bilaterally previously characterized as simple and hemorrhagic/proteinaceous renal cysts. No follow-up imaging is  recommended for these lesions. No enhancing intrarenal masses are seen. The kidneys are otherwise unremarkable. The bladder is unremarkable. Stomach/Bowel: Severe descending and sigmoid colonic diverticulosis without superimposed acute inflammatory change. The stomach, small bowel, and large bowel are otherwise unremarkable. Appendix normal. No free intraperitoneal gas or fluid. Vascular/Lymphatic: Extensive aortoiliac atherosclerotic calcification. No aortic aneurysm. The abdominal vasculature is otherwise unremarkable. Specifically, the splenic vein, superior mesenteric vein, and portal vein are patent. No pathologic adenopathy within the abdomen and pelvis. Reproductive: Mild prostatic hypertrophy. Other: Tiny fat containing left inguinal hernia. Musculoskeletal: No acute bone abnormality. No lytic or blastic bone lesion. Osseous structures are age-appropriate. IMPRESSION: 1. Acute edematous/interstitial pancreatitis. No evidence of pancreatic or peripancreatic necrosis. No loculated peripancreatic fluid collections. 2. Extensive coronary artery calcification. 3. Severe distal colonic diverticulosis without superimposed acute inflammatory change. Aortic Atherosclerosis (ICD10-I70.0). Electronically Signed   By: Fidela Salisbury M.D.   On: 05/12/2022 01:40   MR PROSTATE W WO CONTRAST  Result Date: 04/25/2022 CLINICAL DATA:  Elevated PSA level of 7.57 on 03/19/2022. EXAM: MR PROSTATE WITHOUT AND WITH CONTRAST TECHNIQUE: Multiplanar multisequence MRI images were obtained of the pelvis centered about the prostate. Pre and post contrast images were obtained. CONTRAST:  74m MULTIHANCE GADOBENATE DIMEGLUMINE 529 MG/ML IV SOLN COMPARISON:  None Available. FINDINGS: Prostate: Encapsulated nodularity in the transition zone compatible with benign prostatic hypertrophy. Region of interest # 1: PI-RADS category 4 lesion of the right posterolateral and posteromedial peripheral zone in the mid Gy and, with somewhat bandlike  focally reduced T2 signal (image 12 series 8) with mild corresponding reduced ADC map activity (image 11, series 6) also correlating with a band of early enhancement for example on image 166, series 12. This measures 0.43 cc (1.1 by 0.4 by 1.4 cm). Although this is technically PI-RADS category 4, the bandlike configuration would often be more characteristic of a benign process such as postinflammatory scarring. Volume: 3D volumetric analysis: Prostate volume 46.67 cc (5.1 by 3.9 by 4.8 cm). Transcapsular spread:  Absent Seminal vesicle involvement: Absent Neurovascular bundle involvement: Absent Pelvic adenopathy: Absent Bone metastasis: Absent Other findings: Sigmoid colon diverticulosis. IMPRESSION: 1. Small PI-RADS category 4 lesion in the right peripheral zone. [place]. Mild prostatomegaly and benign prostatic hypertrophy. 3. Sigmoid colon diverticulosis. Electronically Signed   By: WVan ClinesM.D.   On: 04/25/2022 17:00    Microbiology: Results for orders placed or performed during the hospital encounter of 05/11/22  Urine Culture     Status: Abnormal   Collection Time: 05/11/22  1:13 AM   Specimen: Urine, Clean Catch  Result Value Ref Range Status   Specimen Description   Final    URINE, CLEAN CATCH Performed at [facility] [address], [place] [address]   Special Requests   Final    NONE Performed at [facility] [address] [place] [address]   Culture >=100,000 COLONIES/mL ESCHERICHIA COLI (A)  Final   Report Status  05/15/2022 FINAL  Final   Organism ID, Bacteria ESCHERICHIA COLI (A)  Final      Susceptibility   Escherichia coli - MIC*    AMPICILLIN 4 SENSITIVE Sensitive     CEFAZOLIN <=4 SENSITIVE Sensitive     CEFEPIME <=0.12 SENSITIVE Sensitive     CEFTRIAXONE <=0.25 SENSITIVE Sensitive     CIPROFLOXACIN <=0.25 SENSITIVE Sensitive     GENTAMICIN <=1 SENSITIVE Sensitive      IMIPENEM <=0.25 SENSITIVE Sensitive     NITROFURANTOIN <=16 SENSITIVE Sensitive     TRIMETH/SULFA <=20 SENSITIVE Sensitive     AMPICILLIN/SULBACTAM <=2 SENSITIVE Sensitive     PIP/TAZO <=4 SENSITIVE Sensitive     * >=100,000 COLONIES/mL ESCHERICHIA COLI    Labs: CBC: Recent Labs  Lab 05/11/22 2338 05/12/22 0429 05/12/22 1033 05/13/22 0422 05/14/22 0416  WBC 13.9* 16.1* 15.1* 12.4* 8.2  NEUTROABS  --   --   --  9.7* 5.3  HGB 13.7 11.5* 12.3* 11.8* 11.3*  HCT 40.5 34.6* 36.6* 37.1* 33.7*  MCV 90.6 91.8 91.5 95.1 91.8  PLT 228 195 215 207 025   Basic Metabolic Panel: Recent Labs  Lab 05/11/22 2338 05/12/22 0400 05/12/22 0923 05/13/22 0422 05/14/22 0416  NA 138  --  138 139 141  K 4.0  --  4.1 3.9 3.3*  CL 103  --  105 107 109  CO2 24  --  _0 GLUCOSE 188*  --  147* 130* 140*  BUN 18  --  _1 CREATININE 1.04 1.03 1.03 0.93 0.83  CALCIUM 9.3  --  8.7* 8.6* 8.5*  MG  --   --  1.9 2.2 2.3  PHOS  --   --  3.8 2.0* 3.4   Liver Function Tests: Recent Labs  Lab 05/11/22 2338 05/12/22 0923 05/13/22 0422 05/14/22 0416  AST 40 31 30 48*  ALT 43 35 29 49*  ALKPHOS 50 43 43 47  BILITOT 1.5* 1.1 0.9 0.7  PROT 7.8 6.9 6.7 6.4*  ALBUMIN 4.2 3.6 3.3* 3.2*   CBG: Recent Labs  Lab 05/11/22 2338  GLUCAP 178*    Discharge time spent: greater than 30 minutes.  Signed: [name], MD [facility] 05/15/2022

## 2022-05-15 NOTE — Progress Notes (Signed)
Mobility Specialist Cancellation/Refusal Note:    05/15/22 1155  Mobility  Activity Refused mobility     Reason for Cancellation/Refusal: Pt declined mobility at this time. Pt getting ready to head home. Will check back as schedule permits.       [facility]

## 2022-05-15 NOTE — Progress Notes (Signed)
Nurse reviewed discharge instructions with pt.  Pt verbalized understanding of discharge instructions and follow up appointments.  No concerns at time of discharge.  Pt's wife to pick him up from hospital.

## 2022-05-21 ENCOUNTER — Telehealth: Payer: Self-pay

## 2022-05-21 NOTE — Patient Outreach (Signed)
  Care Coordination TOC Note Transition Care Management Follow-up Telephone Call Date of discharge and from where: 05/15/22-[facility] dx; pancreatitis How have you been since you were released from the hospital? Patient voices that he is "still weak but slowly coming back." He has lost 13 lbs since last week/admission which he acknowledges is too much wgt to lose so fast. Appetite is fair Any questions or concerns? Yes-Patient states he does not know "what to expect after having pancreatitis and what to eat."Reviewed diet instructions and to advance as tolerated. Discussed s/s of flare up and GI sxs to report.  Items Reviewed: Did the pt receive and understand the discharge instructions provided? Yes  Medications obtained and verified? Yes  Other? Yes  Any new allergies since your discharge? No  Dietary orders reviewed? Yes Do you have support at home? Yes   Home Care and Equipment/Supplies: Were home health services ordered? not applicable If so, what is the name of the agency? N/A  Has the agency set up a time to come to the patient's home? not applicable Were any new equipment or medical supplies ordered?  No What is the name of the medical supply agency? N/A Were you able to get the supplies/equipment? not applicable Do you have any questions related to the use of the equipment or supplies? No  Functional Questionnaire: (I = Independent and D = Dependent) ADLs: I  Bathing/Dressing- I  Meal Prep- I  Eating- I  Maintaining continence- I  Transferring/Ambulation- I  Managing Meds- I  Follow up appointments reviewed:  PCP Hospital f/u appt confirmed?  No post discharge appt scheduled-encouraged to call and make appt -denies needing assistance. [facility] f/u appt confirmed?  Patient reports he contacted eye doctor's office and I awaiting to hear back regarding follow up. . Are transportation arrangements needed? No  If their condition worsens, is the pt  aware to call PCP or go to the Emergency Dept.? Yes Was the patient provided with contact information for the PCP's office or ED? Yes Was to pt encouraged to call back with questions or concerns? Yes  SDOH assessments and interventions completed:   Yes  Care Coordination Interventions Activated:  Yes   Care Coordination Interventions:  Education provided    Encounter Outcome:  Pt. Visit Completed    Enzo Montgomery, RN,BSN,CCM [place] [facility] Telephonic Care Management Coordinator Direct Phone: [phone] Toll Free: [phone] Fax: [phone]

## 2022-06-01 ENCOUNTER — Encounter: Payer: Self-pay | Admitting: Internal Medicine

## 2022-06-01 NOTE — Progress Notes (Unsigned)
Subjective:    Patient ID: Daniel Hampton, male    DOB: [birth year], 81 y.o.   MRN: [id number]     HPI Daniel Hampton is here for follow up from the hospital.  TCM but out of the window for TCM f/u.  Admitted 10/9 - 10/12.   Went to ED with abdominal pain, n/v, decreased oral intake.  He eats occ fatty foods, occ drinks 1-2 shots of bourbon.  No heavy alcohol use.  Started on imuran for anterior uveitis - [facility].  WBC 14, lipase in 500's, T bili 1/5, lfts normal.  UA with possible infection.  Ct Ab/pelvis showed pancreatitis.    Acute pancreatitis - likely related to imuran, which was discontinued.   UTI - completed Abx.  Anterior uveitis - being treated at [facility].  Also noted to be HLA-B 51 pos with concern for Behcet disease.  Marland Kitchen  COPD stable.  Anemia stable.  CAD stable.   Metoprolol stopped.   He denies any abdominal pain or fevers.His bowels are normal.  He did have nausea 1 day since being home from the hospital.  He does still have a little bit of a decreased appetite.  He has been trying to eat better and has been exercising except for the last few weeks but he plans on restarting soon.    Medications and allergies reviewed with patient and updated if appropriate.  Current Outpatient Medications on File Prior to Visit  Medication Sig Dispense Refill   aspirin EC 81 MG tablet Take 81 mg by mouth daily. Swallow whole.     cetirizine (ZYRTEC) 10 MG tablet Take 1 tablet (10 mg total) by mouth daily. 30 tablet 11   Coenzyme Q10 (COQ-10 PO) Take 1 tablet by mouth daily.     dorzolamide-timolol (COSOPT) 22.3-6.8 MG/ML ophthalmic solution Place 1 drop into the right eye 2 (two) times daily.     Evolocumab (REPATHA SURECLICK) 101 MG/ML SOAJ Inject 1 mL into the skin every 14 (fourteen) days. 6 mL 3   famotidine (PEPCID) 20 MG tablet Take 20 mg by mouth 2 (two) times daily.     fluticasone (FLONASE) 50 MCG/ACT nasal spray Place 2 sprays into both nostrils daily as needed for allergies  or rhinitis.     Multiple Vitamins-Minerals (ALIVE MULTI-VITAMIN PO) Take 1 tablet by mouth daily.     prednisoLONE acetate (PRED FORTE) 1 % ophthalmic suspension Place 1 drop into the right eye daily.     rosuvastatin (CRESTOR) 40 MG tablet Take 20 mg by mouth daily. Takes 1/2 tablet (55m)     tamsulosin (FLOMAX) 0.4 MG CAPS capsule Take 0.4 mg by mouth daily.     Olopatadine HCl (PATADAY) 0.2 % SOLN 1 drop into affected eye Ophthalmic-OTC Once a day as needed for 30 days     No current facility-administered medications on file prior to visit.     Review of Systems  Constitutional:  Positive for appetite change (not normal yet). Negative for chills and fever.  Respiratory:  Negative for shortness of breath.   Cardiovascular:  Negative for chest pain, palpitations and leg swelling.  Gastrointestinal:  Positive for nausea (one day since being home). Negative for abdominal pain, blood in stool, constipation and diarrhea.       Normal stool color  Neurological:  Negative for light-headedness and headaches.       Objective:   Vitals:   06/02/22 1347  BP: 110/64  Pulse: 74  Temp: 98.4  F (36.9 C)  SpO2: 95%   BP Readings from Last 3 Encounters:  06/02/22 110/64  05/15/22 128/76  04/01/22 126/72   Wt Readings from Last 3 Encounters:  06/02/22 220 lb (99.8 kg)  05/12/22 230 lb (104.3 kg)  04/10/22 240 lb (108.9 kg)   Body mass index is 31.57 kg/m.    Physical Exam Constitutional:      General: He is not in acute distress.    Appearance: Normal appearance. He is not ill-appearing.  HENT:     Head: Normocephalic and atraumatic.  Eyes:     Conjunctiva/sclera: Conjunctivae normal.  Cardiovascular:     Rate and Rhythm: Normal rate and regular rhythm.     Heart sounds: Normal heart sounds. No murmur heard. Pulmonary:     Effort: Pulmonary effort is normal. No respiratory distress.     Breath sounds: Normal breath sounds. No wheezing or rales.  Abdominal:     General:  There is no distension.     Palpations: Abdomen is soft.     Tenderness: There is no abdominal tenderness. There is no guarding or rebound.  Musculoskeletal:     Right lower leg: No edema.     Left lower leg: No edema.  Skin:    General: Skin is warm and dry.     Findings: No rash.  Neurological:     Mental Status: He is alert. Mental status is at baseline.  Psychiatric:        Mood and Affect: Mood normal.        Lab Results  Component Value Date   WBC 8.2 05/14/2022   HGB 11.3 (L) 05/14/2022   HCT 33.7 (L) 05/14/2022   PLT 209 05/14/2022   GLUCOSE 140 (H) 05/14/2022   CHOL 143 02/03/2022   TRIG 63 05/12/2022   HDL 32.40 (L) 02/03/2022   LDLDIRECT 88.0 02/03/2022   LDLCALC 91 08/13/2021   ALT 49 (H) 05/14/2022   AST 48 (H) 05/14/2022   NA 141 05/14/2022   K 3.3 (L) 05/14/2022   CL 109 05/14/2022   CREATININE 0.83 05/14/2022   BUN 12 05/14/2022   CO2 25 05/14/2022   TSH 2.80 01/18/2020   PSA 5.31 08/02/2018   HGBA1C 6.1 08/13/2021     Assessment & Plan:    See Problem List for Assessment and Plan of chronic medical problems.

## 2022-06-01 NOTE — Patient Instructions (Signed)
      Blood work was ordered.   The lab is on the first floor.    Medications changes include :   none    Return for once a year for a physical.

## 2022-06-02 ENCOUNTER — Ambulatory Visit (INDEPENDENT_AMBULATORY_CARE_PROVIDER_SITE_OTHER): Payer: Medicare Other | Admitting: Internal Medicine

## 2022-06-02 VITALS — BP 110/64 | HR 74 | Temp 98.4°F | Ht 70.0 in | Wt 220.0 lb

## 2022-06-02 DIAGNOSIS — I2510 Atherosclerotic heart disease of native coronary artery without angina pectoris: Secondary | ICD-10-CM

## 2022-06-02 DIAGNOSIS — E785 Hyperlipidemia, unspecified: Secondary | ICD-10-CM | POA: Diagnosis not present

## 2022-06-02 DIAGNOSIS — R7303 Prediabetes: Secondary | ICD-10-CM | POA: Diagnosis not present

## 2022-06-02 DIAGNOSIS — H209 Unspecified iridocyclitis: Secondary | ICD-10-CM

## 2022-06-02 DIAGNOSIS — N390 Urinary tract infection, site not specified: Secondary | ICD-10-CM

## 2022-06-02 DIAGNOSIS — K859 Acute pancreatitis without necrosis or infection, unspecified: Secondary | ICD-10-CM

## 2022-06-02 LAB — CBC WITH DIFFERENTIAL/PLATELET
Basophils Absolute: 0.1 10*3/uL (ref 0.0–0.1)
Basophils Relative: 0.9 % (ref 0.0–3.0)
Eosinophils Absolute: 0.2 10*3/uL (ref 0.0–0.7)
Eosinophils Relative: 2.8 % (ref 0.0–5.0)
HCT: 39.7 % (ref 39.0–52.0)
Hemoglobin: 13.5 g/dL (ref 13.0–17.0)
Lymphocytes Relative: 37 % (ref 12.0–46.0)
Lymphs Abs: 2.8 10*3/uL (ref 0.7–4.0)
MCHC: 34.1 g/dL (ref 30.0–36.0)
MCV: 89.3 fl (ref 78.0–100.0)
Monocytes Absolute: 0.5 10*3/uL (ref 0.1–1.0)
Monocytes Relative: 7.2 % (ref 3.0–12.0)
Neutro Abs: 3.9 10*3/uL (ref 1.4–7.7)
Neutrophils Relative %: 52.1 % (ref 43.0–77.0)
Platelets: 234 10*3/uL (ref 150.0–400.0)
RBC: 4.44 Mil/uL (ref 4.22–5.81)
RDW: 13.6 % (ref 11.5–15.5)
WBC: 7.6 10*3/uL (ref 4.0–10.5)

## 2022-06-02 LAB — LIPID PANEL
Cholesterol: 64 mg/dL (ref 0–200)
HDL: 42.5 mg/dL (ref >39.00)
LDL Cholesterol: -14 mg/dL — ABNORMAL LOW (ref 0–99)
NonHDL: 21.34
Total CHOL/HDL Ratio: 2
Triglycerides: 177 mg/dL — ABNORMAL HIGH (ref 0.0–149.0)
VLDL: 35.4 mg/dL (ref 0.0–40.0)

## 2022-06-02 LAB — COMPREHENSIVE METABOLIC PANEL
ALT: 30 U/L (ref 0–53)
AST: 27 U/L (ref 0–37)
Albumin: 4.4 g/dL (ref 3.5–5.2)
Alkaline Phosphatase: 56 U/L (ref 39–117)
BUN: 16 mg/dL (ref 6–23)
CO2: 27 mEq/L (ref 19–32)
Calcium: 9.5 mg/dL (ref 8.4–10.5)
Chloride: 104 mEq/L (ref 96–112)
Creatinine, Ser: 0.9 mg/dL (ref 0.40–1.50)
GFR: 79.6 mL/min (ref >60.00)
Glucose, Bld: 129 mg/dL — ABNORMAL HIGH (ref 70–99)
Potassium: 4.1 mEq/L (ref 3.5–5.1)
Sodium: 139 mEq/L (ref 135–145)
Total Bilirubin: 0.6 mg/dL (ref 0.2–1.2)
Total Protein: 7.3 g/dL (ref 6.0–8.3)

## 2022-06-02 LAB — VITAMIN D 25 HYDROXY (VIT D DEFICIENCY, FRACTURES): VITD: 37.76 ng/mL (ref 30.00–100.00)

## 2022-06-02 LAB — HEMOGLOBIN A1C: Hgb A1c MFr Bld: 7.3 % — ABNORMAL HIGH (ref 4.6–6.5)

## 2022-06-02 MED ORDER — ROSUVASTATIN CALCIUM 20 MG PO TABS: 20.0000 mg | ORAL_TABLET | Freq: Every day | ORAL | 3 refills | Status: DC

## 2022-06-02 NOTE — Assessment & Plan Note (Signed)
Chronic Was on Imuran-cause pancreatitis Has follow-up with [facility]

## 2022-06-02 NOTE — Assessment & Plan Note (Signed)
Recent blood work showed low calcium Check vitamin D level

## 2022-06-02 NOTE — Assessment & Plan Note (Signed)
Secondary to Imuran Currently without symptoms-no abdominal pain, no nausea no and bowels are normal Appetite slowly improving We will check CMP, CBC today

## 2022-06-02 NOTE — Assessment & Plan Note (Signed)
While in the hospital urine was positive for infection received IV Rocephin

## 2022-06-02 NOTE — Assessment & Plan Note (Signed)
Chronic Check a1c Low sugar / carb diet Stressed regular exercise  

## 2022-06-02 NOTE — Assessment & Plan Note (Signed)
Chronic Following with Dr. Ellyn Hack On Repatha Getting blood work done today and is due for lipids next month with Dr. Shelbie Proctor will check today Continue Crestor 20 mg daily Continue regular walking-we will start rehab he will restart his normal walking this week

## 2022-06-02 NOTE — Assessment & Plan Note (Signed)
Chronic Continue Crestor 20 mg daily-did not tolerate the 40 mg dose Also on Repatha 140 mg q. 14 days We will check lipids today since he is scheduled to get blood work done next week for cardiology and he is getting blood work done today-we will forward to cardiology

## 2022-06-04 ENCOUNTER — Ambulatory Visit: Payer: Medicare Other | Admitting: Dermatology

## 2022-06-11 ENCOUNTER — Other Ambulatory Visit: Payer: Self-pay | Admitting: Adult Health

## 2022-06-11 ENCOUNTER — Encounter: Payer: Self-pay | Admitting: Internal Medicine

## 2022-06-11 ENCOUNTER — Telehealth: Payer: Self-pay | Admitting: Internal Medicine

## 2022-06-11 DIAGNOSIS — R918 Other nonspecific abnormal finding of lung field: Secondary | ICD-10-CM

## 2022-06-11 MED ORDER — ONDANSETRON HCL 4 MG PO TABS: 4.0000 mg | ORAL_TABLET | Freq: Three times a day (TID) | ORAL | 0 refills | Status: DC | PRN

## 2022-06-11 MED ORDER — HYDROCOD POLI-CHLORPHE POLI ER 10-8 MG/5ML PO SUER: 5.0000 mL | Freq: Two times a day (BID) | ORAL | 0 refills | Status: DC | PRN

## 2022-06-11 MED ORDER — MOLNUPIRAVIR EUA 200MG CAPSULE: 4.0000 | ORAL_CAPSULE | Freq: Two times a day (BID) | ORAL | 0 refills | Status: AC

## 2022-06-11 NOTE — Telephone Encounter (Signed)
sent 

## 2022-06-11 NOTE — Telephone Encounter (Signed)
Pt called and gave phone to wife [place]. She reports COVID-19+ yesterday.  FEVER 100.3 yestrday.  FFEVEER 99.7 this am after taking TYLENOL every 6 hours.  SEVERE COUGH.  Taking Robetussin. EXTREME NAUSEA:  unable to keep anything food or water down. Last meal was Monday.  Does provider want to call in a specific medication to fight covid and a nausea medication.  Call pt wife Lovey Newcomer at [facility])

## 2022-06-11 NOTE — Telephone Encounter (Signed)
I do not want him to have to hold his Crestor so lets do molnupiravir-this was sent to the pharmacy.  I also sent an antinausea medication to use as needed  Call with questions or no improvement.

## 2022-06-12 ENCOUNTER — Inpatient Hospital Stay: Admission: RE | Admit: 2022-06-12 | Payer: Medicare Other | Source: Ambulatory Visit

## 2022-06-16 ENCOUNTER — Ambulatory Visit: Payer: Medicare Other | Admitting: Internal Medicine

## 2022-07-07 DIAGNOSIS — H20051 Hypopyon, right eye: Secondary | ICD-10-CM | POA: Diagnosis not present

## 2022-07-07 DIAGNOSIS — H40051 Ocular hypertension, right eye: Secondary | ICD-10-CM | POA: Diagnosis not present

## 2022-07-07 DIAGNOSIS — H2101 Hyphema, right eye: Secondary | ICD-10-CM | POA: Diagnosis not present

## 2022-07-07 DIAGNOSIS — H209 Unspecified iridocyclitis: Secondary | ICD-10-CM | POA: Diagnosis not present

## 2022-07-07 DIAGNOSIS — H268 Other specified cataract: Secondary | ICD-10-CM | POA: Diagnosis not present

## 2022-07-08 DIAGNOSIS — H16001 Unspecified corneal ulcer, right eye: Secondary | ICD-10-CM | POA: Diagnosis not present

## 2022-07-08 DIAGNOSIS — H268 Other specified cataract: Secondary | ICD-10-CM | POA: Diagnosis not present

## 2022-07-08 DIAGNOSIS — H30033 Focal chorioretinal inflammation, peripheral, bilateral: Secondary | ICD-10-CM | POA: Diagnosis not present

## 2022-07-08 DIAGNOSIS — H21541 Posterior synechiae (iris), right eye: Secondary | ICD-10-CM | POA: Diagnosis not present

## 2022-07-15 ENCOUNTER — Ambulatory Visit
Admission: RE | Admit: 2022-07-15 | Discharge: 2022-07-15 | Disposition: A | Discharge status: Home / Self Care | Payer: Medicare Other | Source: Ambulatory Visit | Attending: Adult Health | Admitting: Adult Health

## 2022-07-15 DIAGNOSIS — I700 Atherosclerosis of aorta: Secondary | ICD-10-CM | POA: Diagnosis not present

## 2022-07-15 DIAGNOSIS — R911 Solitary pulmonary nodule: Secondary | ICD-10-CM | POA: Diagnosis not present

## 2022-07-15 DIAGNOSIS — J439 Emphysema, unspecified: Secondary | ICD-10-CM | POA: Diagnosis not present

## 2022-07-15 DIAGNOSIS — R918 Other nonspecific abnormal finding of lung field: Secondary | ICD-10-CM

## 2022-07-17 ENCOUNTER — Encounter: Payer: Self-pay | Admitting: Adult Health

## 2022-07-17 ENCOUNTER — Ambulatory Visit: Payer: Medicare Other | Admitting: Adult Health

## 2022-07-17 VITALS — BP 100/60 | HR 60 | Temp 98.3°F | Ht 70.0 in | Wt 224.2 lb

## 2022-07-17 DIAGNOSIS — R053 Chronic cough: Secondary | ICD-10-CM | POA: Diagnosis not present

## 2022-07-17 DIAGNOSIS — J309 Allergic rhinitis, unspecified: Secondary | ICD-10-CM

## 2022-07-17 DIAGNOSIS — K219 Gastro-esophageal reflux disease without esophagitis: Secondary | ICD-10-CM | POA: Diagnosis not present

## 2022-07-17 DIAGNOSIS — R918 Other nonspecific abnormal finding of lung field: Secondary | ICD-10-CM

## 2022-07-17 NOTE — Progress Notes (Signed)
$'[name]$  ID: Daniel Hampton, male    DOB: 10/26/1940, 81 y.o.   MRN: [id number]  Chief Complaint  Patient presents with   Follow-up    Referring provider: [name], MD  HPI: 81 yo male former smoker seen for pulmonary consult 05/16/21 for cough and abnormal CT chest and mild emphysema  TEST/EVENTS :  HRCT chest 05/2021-No evidence of fibrotic interstitial lung disease. 2. Minimal emphysema. 3. Small pulmonary nodules measuring up to 0.4 cm. No follow-up needed if patient is low-risk (and has no known or suspected primary neoplasm). Non-contrast chest CT can be considered in 12 months if patient is high-risk.   Alpha-1 antitrypsin level was normal with MM phenotype.   PFTs done today show normal lung function with FEV1 at 117%, ratio 84, FVC 101%. No significant bronchodilator response, DLCO 117%.    07/17/2022 Follow up : Chronic cough and Abnormal CT chest  Patient returns for a 1 year follow-up.  Patient has underlying chronic cough that improved substantially with treatment aimed at GERD.  Patient says he has no significant cough currently.  He is taking Pepcid at bedtime and seems to control it very well.  Previous PFTs were normal. Patient did have COVID-19 infection in October.  Michela Pitcher it took him a few weeks to get over.  He does complain he has had a very rough year with severe right eye infection secondary to prolonged use of a contact.  Has been following extensively with ophthalmology and diagnosed with uveitis.  Was started on azanthroprine but had a drug reaction and acute pancreatitis required hospitalization.  This was discontinued. CT chest that showed previous lung nodules.  Serial CT done on July 15, 2022 showed stable groundglass nodule in the right upper lobe unchanged since 2022 measuring 8 mm, groundglass nodule in the left upper lobe similar size.  And other small nodules stable.  Mild emphysema.  We discussed his CT results in detail. Patient is under some  stress his wife has ovarian cancer and is undergoing treatment.  Allergies  Allergen Reactions   Atorvastatin Other (See Comments)    myalgia   Crestor [Rosuvastatin] Other (See Comments)    Too high of a dose makes his muscles ache   Imuran [Azathioprine] Other (See Comments)    pancreatitis   Tape Rash    Immunization History  Administered Date(s) Administered   Fluad Quad(high Dose 65+) 05/10/2019, 06/01/2021, 05/13/2022   Influenza Split 04/14/2012, 05/31/2013   Influenza Whole 08/04/2005, 07/19/2007   Influenza, High Dose Seasonal PF 06/05/2015, 07/01/2016, 05/12/2018, 05/23/2020   Influenza,inj,Quad PF,6+ Mos 06/06/2017   Influenza-Unspecified 05/04/2014, 06/05/2015   PFIZER(Purple Top)SARS-COV-2 Vaccination 08/26/2019, 09/13/2019, 06/05/2020   Pneumococcal Conjugate-13 08/08/2014   Pneumococcal Polysaccharide-23 08/04/2004, 12/31/2010, 09/29/2019   Td 08/04/2006   Tdap 08/26/2021   Zoster, Live 04/04/2012    Past Medical History:  Diagnosis Date   Allergy    SEASONAL   Arthritis    Asthma    COLONIC POLYPS, HX OF 03/12/2007   [place] DISEASE, LUMBAR 12/03/2008   DIVERTICULOSIS, COLON 10/17/2009   History of kidney stones    Hydrocele    HYPERLIPIDEMIA 03/12/2007   Hyphema of right eye    Complicated by allergic conjunctivitis   Melanoma ([place]) 04/23/2020   in situ-Left Upper Arm   MORTON'S NEUROMA, RIGHT 03/13/2008   SCC (squamous cell carcinoma) 04/26/2013   right hand-txpbx   SKIN CANCER, HX OF 10/17/2009   VERTIGO, BENIGN PAROXYSMAL POSITION 04/28/2007    Tobacco History:  Social History   Tobacco Use  Smoking Status Former   Packs/day: 1.00   Years: 20.00   Total pack years: 20.00   Types: Cigarettes   Quit date: 10/30/1978   Years since quitting: 43.7  Smokeless Tobacco Never   Counseling given: Not Answered   Outpatient Medications Prior to Visit  Medication Sig Dispense Refill   aspirin EC 81 MG tablet Take 81 mg by mouth daily. Swallow  whole.     cetirizine (ZYRTEC) 10 MG tablet Take 1 tablet (10 mg total) by mouth daily. (Patient taking differently: Take 10 mg by mouth daily as needed.) 30 tablet 11   Coenzyme Q10 (COQ-10 PO) Take 1 tablet by mouth daily.     dorzolamide-timolol (COSOPT) 22.3-6.8 MG/ML ophthalmic solution Place 1 drop into the right eye 2 (two) times daily.     Evolocumab (REPATHA SURECLICK) 829 MG/ML SOAJ Inject 1 mL into the skin every 14 (fourteen) days. 6 mL 3   famotidine (PEPCID) 20 MG tablet Take 20 mg by mouth 2 (two) times daily.     Multiple Vitamins-Minerals (ALIVE MULTI-VITAMIN PO) Take 1 tablet by mouth daily.     prednisoLONE acetate (PRED FORTE) 1 % ophthalmic suspension Place 1 drop into the right eye daily.     rosuvastatin (CRESTOR) 20 MG tablet Take 1 tablet (20 mg total) by mouth daily. 90 tablet 3   tamsulosin (FLOMAX) 0.4 MG CAPS capsule Take 0.4 mg by mouth as needed. 1-2 times per week.     fluticasone (FLONASE) 50 MCG/ACT nasal spray Place 2 sprays into both nostrils daily as needed for allergies or rhinitis. (Patient not taking: Reported on 07/17/2022)     ondansetron (ZOFRAN) 4 MG tablet Take 1 tablet (4 mg total) by mouth every 8 (eight) hours as needed for nausea or vomiting. (Patient not taking: Reported on 07/17/2022) 20 tablet 0   chlorpheniramine-HYDROcodone (TUSSIONEX) 10-8 MG/5ML Take 5 mLs by mouth every 12 (twelve) hours as needed for cough. (Patient not taking: Reported on 07/17/2022) 115 mL 0   Olopatadine HCl (PATADAY) 0.2 % SOLN 1 drop into affected eye Ophthalmic-OTC Once a day as needed for 30 days (Patient not taking: Reported on 07/17/2022)     No facility-administered medications prior to visit.     Review of Systems:   Constitutional:   No  weight loss, night sweats,  Fevers, chills,  +fatigue, or  lassitude.  HEENT:   No headaches,  Difficulty swallowing,  Tooth/dental problems, or  Sore throat,                No sneezing, itching, ear ache, nasal  congestion, post nasal drip,   CV:  No chest pain,  Orthopnea, PND, swelling in lower extremities, anasarca, dizziness, palpitations, syncope.   GI  No heartburn, indigestion, abdominal pain, nausea, vomiting, diarrhea, change in bowel habits, loss of appetite, bloody stools.   Resp: No shortness of breath with exertion or at rest.  No excess mucus, no productive cough,  No non-productive cough,  No coughing up of blood.  No change in color of mucus.  No wheezing.  No chest wall deformity  Skin: no rash or lesions.  GU: no dysuria, change in color of urine, no urgency or frequency.  No flank pain, no hematuria   MS:  No joint pain or swelling.  No decreased range of motion.  No back pain.    Physical Exam  BP 100/60 (BP Location: Left Arm, Patient Position: Sitting, Cuff Size: Large)  Pulse 60   Temp 98.3 F (36.8 C) (Oral)   Ht '5\' 10"'$  (1.778 m)   Wt 224 lb 3.2 oz (101.7 kg)   SpO2 96%   BMI 32.17 kg/m   GEN: A/Ox3; pleasant , NAD, well nourished    HEENT:  [place]/AT,  NOSE-clear, THROAT-clear, no lesions, no postnasal drip or exudate noted.   NECK:  Supple w/ fair ROM; no JVD; normal carotid impulses w/o bruits; no thyromegaly or nodules palpated; no lymphadenopathy.    RESP  Clear  P & A; w/o, wheezes/ rales/ or rhonchi. no accessory muscle use, no dullness to percussion  CARD:  RRR, no m/r/g, no peripheral edema, pulses intact, no cyanosis or clubbing.  GI:   Soft & nt; nml bowel sounds; no organomegaly or masses detected.   Musco: Warm bil, no deformities or joint swelling noted.   Neuro: alert, no focal deficits noted.    Skin: Warm, no lesions or rashes    Lab Results:  CBC    Component Value Date/Time   WBC 7.6 06/02/2022 1431   RBC 4.44 06/02/2022 1431   HGB 13.5 06/02/2022 1431   HCT 39.7 06/02/2022 1431   PLT 234.0 06/02/2022 1431   MCV 89.3 06/02/2022 1431   MCH 30.8 05/14/2022 0416   MCHC 34.1 06/02/2022 1431   RDW 13.6 06/02/2022 1431    LYMPHSABS 2.8 06/02/2022 1431   MONOABS 0.5 06/02/2022 1431   EOSABS 0.2 06/02/2022 1431   BASOSABS 0.1 06/02/2022 1431    BMET    Component Value Date/Time   NA 139 06/02/2022 1431   NA 141 01/17/2022 1011   K 4.1 06/02/2022 1431   CL 104 06/02/2022 1431   CO2 27 06/02/2022 1431   GLUCOSE 129 (H) 06/02/2022 1431   GLUCOSE 95 07/24/2006 1148   BUN 16 06/02/2022 1431   BUN 15 01/17/2022 1011   CREATININE 0.90 06/02/2022 1431   CALCIUM 9.5 06/02/2022 1431   GFRNONAA >60 05/14/2022 0416   GFRAA 108 09/18/2008 0000    BNP No results found for: "BNP"  ProBNP No results found for: "PROBNP"  Imaging: CT CHEST WO CONTRAST  Result Date: 07/16/2022 CLINICAL DATA:  History of lung nodules, follow-up evaluation. Also with history of melanoma and basal cell carcinoma. * Tracking Code: BO * EXAM: CT CHEST WITHOUT CONTRAST TECHNIQUE: Multidetector CT imaging of the chest was performed following the standard protocol without IV contrast. RADIATION DOSE REDUCTION: This exam was performed according to the departmental dose-optimization program which includes automated exposure control, adjustment of the mA and/or kV according to patient size and/or use of iterative reconstruction technique. COMPARISON:  January 07, 2022 and CT of the abdomen from May 12, 2022. FINDINGS: Cardiovascular: Calcified aortic atherosclerotic changes. Three-vessel coronary artery disease. Extensive coronary artery calcification of LEFT and RIGHT coronary circulation. No pericardial effusion. No nodularity. Normal heart size. Normal caliber central pulmonary vessels. Limited assessment of cardiovascular structures given lack of intravenous contrast. Mediastinum/Nodes: No thoracic inlet, axillary, mediastinal or hilar adenopathy. Esophagus grossly normal. Lungs/Pleura: Ground-glass nodule in the inferior RIGHT upper lobe (image 74/5) unchanged since previous imaging from May of 2023 and in fact stable since October of 2022.  Small nodule in the posterior LEFT upper lobe 4 mm size, stable since October of 2022 in compatible with a benign nodule. Indistinct ground-glass nodule in the LEFT upper lobe (image 35/2) 4 mm, also unchanged. Signs of paraseptal emphysema. Stable 3 mm upper lobe nodule on image 62/5. Subtle ground-glass area measuring 8  mm also in the LEFT upper lobe (image 47/5. This may be slightly more conspicuous than in 2022 but is overall similar size. No consolidation.  No pleural effusion. Upper Abdomen: Low-density hepatic lesions likely small cysts are unchanged since October of 2022. No acute findings relative to visualized liver, gallbladder, pancreas, spleen, adrenal glands or kidneys. No acute gastrointestinal process to the extent evaluated in the upper abdomen. Musculoskeletal: No chest wall mass. No acute bone finding. No destructive bone process. Spinal degenerative changes. IMPRESSION: 1. Ground-glass nodule in the RIGHT upper lobe unchanged since 2022. This measures 8 mm. Consider follow-up CT every 2 years to establish 5 years of stability for this finding. 2. Ground-glass nodule in the LEFT upper lobe of similar size, see recommendations above. 3. Other small nodules likely benign based on stability at 1 year. 4. Signs of paraseptal emphysema. 5. Three-vessel coronary artery disease. 6. Low-density hepatic lesions likely small cysts are unchanged since October of 2022. 7. Aortic atherosclerosis. Aortic Atherosclerosis (ICD10-I70.0) and Emphysema (ICD10-J43.9). Electronically Signed   By: Zetta Bills M.D.   On: 07/16/2022 11:38         Latest Ref Rng & Units 07/02/2021   10:55 AM  PFT Results  FVC-Pre L 3.80   FVC-Predicted Pre % 104   FVC-Post L 3.68   FVC-Predicted Post % 101   Pre FEV1/FVC % % 78   Post FEV1/FCV % % 84   FEV1-Pre L 2.95   FEV1-Predicted Pre % 111   FEV1-Post L 3.10   DLCO uncorrected ml/min/mmHg 28.53   DLCO UNC% % 117   DLCO corrected ml/min/mmHg 28.53   DLCO COR  %Predicted % 117   DLVA Predicted % 116   TLC L 8.65   TLC % Predicted % 122   RV % Predicted % 142     No results found for: "NITRICOXIDE"      Assessment & Plan:   Lung nodules Lung nodules remain stable on CT scan.  Follow-up CT chest in 1 year.  Allergic rhinitis Continue on current regimen appears to be under good control.  Chronic cough On current regimen.  Continue with trigger prevention.  Plan  Patient Instructions  CT chest in 1 year to follow lung nodules  Continue on Pepcid '20mg'$  At bedtime   Delsym 2 tsp Twice daily  As needed cough.  Albuterol inhaler 1-2 puffs every 6hr as needed.  Follow up with Dr. Chase Caller or Charlii Yost NP in 1 year and As needed        Gastro-esophageal reflux disease without esophagitis Continue on GERD diet.  Continue on Pepcid at bedtime     Rexene Edison, NP 07/17/2022

## 2022-07-17 NOTE — Assessment & Plan Note (Signed)
On current regimen.  Continue with trigger prevention.  Plan  Patient Instructions  CT chest in 1 year to follow lung nodules  Continue on Pepcid '20mg'$  At bedtime   Delsym 2 tsp Twice daily  As needed cough.  Albuterol inhaler 1-2 puffs every 6hr as needed.  Follow up with Dr. Chase Caller or Sidrah Harden NP in 1 year and As needed

## 2022-07-17 NOTE — Assessment & Plan Note (Signed)
Continue on current regimen appears to be under good control.

## 2022-07-17 NOTE — Patient Instructions (Addendum)
CT chest in 1 year to follow lung nodules  Continue on Pepcid '20mg'$  At bedtime   Delsym 2 tsp Twice daily  As needed cough.  Albuterol inhaler 1-2 puffs every 6hr as needed.  Follow up with Dr. Chase Caller or Jericho Cieslik NP in 1 year and As needed

## 2022-07-17 NOTE — Assessment & Plan Note (Signed)
Continue on GERD diet.  Continue on Pepcid at bedtime

## 2022-07-17 NOTE — Assessment & Plan Note (Signed)
Lung nodules remain stable on CT scan.  Follow-up CT chest in 1 year.

## 2022-07-23 DIAGNOSIS — H5709 Other anomalies of pupillary function: Secondary | ICD-10-CM | POA: Diagnosis not present

## 2022-07-23 DIAGNOSIS — H268 Other specified cataract: Secondary | ICD-10-CM | POA: Diagnosis not present

## 2022-07-23 DIAGNOSIS — H2141 Pupillary membranes, right eye: Secondary | ICD-10-CM | POA: Diagnosis not present

## 2022-07-24 DIAGNOSIS — H209 Unspecified iridocyclitis: Secondary | ICD-10-CM | POA: Diagnosis not present

## 2022-07-24 DIAGNOSIS — H30033 Focal chorioretinal inflammation, peripheral, bilateral: Secondary | ICD-10-CM | POA: Diagnosis not present

## 2022-07-24 DIAGNOSIS — H1820 Unspecified corneal edema: Secondary | ICD-10-CM | POA: Diagnosis not present

## 2022-07-24 DIAGNOSIS — H21541 Posterior synechiae (iris), right eye: Secondary | ICD-10-CM | POA: Diagnosis not present

## 2022-07-24 DIAGNOSIS — Z888 Allergy status to other drugs, medicaments and biological substances status: Secondary | ICD-10-CM | POA: Diagnosis not present

## 2022-07-24 DIAGNOSIS — H02401 Unspecified ptosis of right eyelid: Secondary | ICD-10-CM | POA: Diagnosis not present

## 2022-07-24 DIAGNOSIS — Z961 Presence of intraocular lens: Secondary | ICD-10-CM | POA: Diagnosis not present

## 2022-07-24 DIAGNOSIS — H16001 Unspecified corneal ulcer, right eye: Secondary | ICD-10-CM | POA: Diagnosis not present

## 2022-07-24 DIAGNOSIS — Z48810 Encounter for surgical aftercare following surgery on the sense organs: Secondary | ICD-10-CM | POA: Diagnosis not present

## 2022-07-24 DIAGNOSIS — H0288A Meibomian gland dysfunction right eye, upper and lower eyelids: Secondary | ICD-10-CM | POA: Diagnosis not present

## 2022-07-30 DIAGNOSIS — Z48810 Encounter for surgical aftercare following surgery on the sense organs: Secondary | ICD-10-CM | POA: Diagnosis not present

## 2022-07-30 DIAGNOSIS — H209 Unspecified iridocyclitis: Secondary | ICD-10-CM | POA: Diagnosis not present

## 2022-07-30 DIAGNOSIS — H1820 Unspecified corneal edema: Secondary | ICD-10-CM | POA: Diagnosis not present

## 2022-08-05 DIAGNOSIS — Z48810 Encounter for surgical aftercare following surgery on the sense organs: Secondary | ICD-10-CM | POA: Diagnosis not present

## 2022-08-05 DIAGNOSIS — H20051 Hypopyon, right eye: Secondary | ICD-10-CM | POA: Diagnosis not present

## 2022-08-05 DIAGNOSIS — H16001 Unspecified corneal ulcer, right eye: Secondary | ICD-10-CM | POA: Diagnosis not present

## 2022-08-05 DIAGNOSIS — Z961 Presence of intraocular lens: Secondary | ICD-10-CM | POA: Diagnosis not present

## 2022-08-19 DIAGNOSIS — H16001 Unspecified corneal ulcer, right eye: Secondary | ICD-10-CM | POA: Diagnosis not present

## 2022-08-19 DIAGNOSIS — H4051X0 Glaucoma secondary to other eye disorders, right eye, stage unspecified: Secondary | ICD-10-CM | POA: Diagnosis not present

## 2022-08-19 DIAGNOSIS — H209 Unspecified iridocyclitis: Secondary | ICD-10-CM | POA: Diagnosis not present

## 2022-08-19 DIAGNOSIS — Z961 Presence of intraocular lens: Secondary | ICD-10-CM | POA: Diagnosis not present

## 2022-08-19 DIAGNOSIS — Z48810 Encounter for surgical aftercare following surgery on the sense organs: Secondary | ICD-10-CM | POA: Diagnosis not present

## 2022-08-26 DIAGNOSIS — H21541 Posterior synechiae (iris), right eye: Secondary | ICD-10-CM | POA: Diagnosis not present

## 2022-08-26 DIAGNOSIS — H16001 Unspecified corneal ulcer, right eye: Secondary | ICD-10-CM | POA: Diagnosis not present

## 2022-08-26 DIAGNOSIS — Z961 Presence of intraocular lens: Secondary | ICD-10-CM | POA: Diagnosis not present

## 2022-08-26 DIAGNOSIS — H30033 Focal chorioretinal inflammation, peripheral, bilateral: Secondary | ICD-10-CM | POA: Diagnosis not present

## 2022-09-04 DIAGNOSIS — N401 Enlarged prostate with lower urinary tract symptoms: Secondary | ICD-10-CM | POA: Diagnosis not present

## 2022-09-04 DIAGNOSIS — R350 Frequency of micturition: Secondary | ICD-10-CM | POA: Diagnosis not present

## 2022-09-04 DIAGNOSIS — R972 Elevated prostate specific antigen [PSA]: Secondary | ICD-10-CM | POA: Diagnosis not present

## 2022-09-22 DIAGNOSIS — H209 Unspecified iridocyclitis: Secondary | ICD-10-CM | POA: Diagnosis not present

## 2022-09-22 DIAGNOSIS — H02403 Unspecified ptosis of bilateral eyelids: Secondary | ICD-10-CM | POA: Diagnosis not present

## 2022-09-22 DIAGNOSIS — Z888 Allergy status to other drugs, medicaments and biological substances status: Secondary | ICD-10-CM | POA: Diagnosis not present

## 2022-09-22 DIAGNOSIS — H16001 Unspecified corneal ulcer, right eye: Secondary | ICD-10-CM | POA: Diagnosis not present

## 2022-09-22 DIAGNOSIS — H179 Unspecified corneal scar and opacity: Secondary | ICD-10-CM | POA: Diagnosis not present

## 2022-09-22 DIAGNOSIS — Z961 Presence of intraocular lens: Secondary | ICD-10-CM | POA: Diagnosis not present

## 2022-09-22 DIAGNOSIS — H0288A Meibomian gland dysfunction right eye, upper and lower eyelids: Secondary | ICD-10-CM | POA: Diagnosis not present

## 2022-09-22 DIAGNOSIS — Z48810 Encounter for surgical aftercare following surgery on the sense organs: Secondary | ICD-10-CM | POA: Diagnosis not present

## 2022-09-23 ENCOUNTER — Telehealth: Payer: Self-pay

## 2022-09-23 NOTE — Telephone Encounter (Signed)
Called patient to schedule Medicare Annual Wellness Visit (AWV). No voicemail available to leave a message.  Last date of AWV: 03/05/20  Please schedule an appointment at any time with [facility].  If any questions, please contact me.  Thank you ,  Norton Blizzard, [place] ([place])  [facility] [phone]

## 2022-09-24 ENCOUNTER — Ambulatory Visit: Payer: Medicare Other | Admitting: Cardiology

## 2022-10-04 NOTE — Progress Notes (Unsigned)
Cardiology Clinic Note   Date: 10/07/2022 ID: Daniel Hampton, Daniel Hampton 08-07-1940, MRN [id number]  Primary Cardiologist:  Glenetta Hew, MD  Patient Profile    Daniel Hampton is a 82 y.o. male who presents to the clinic today for 63-monthfollow-up of chronic cardiac conditions.  Past medical history significant for: Nonobstructive coronary artery disease. Coronary CTA with FFR 01/24/2022: Calcium score 1540 (76 percentile). Calcified plaque distal LM.  Extensive calcified plaque proximal LAD.  Calcified plaque proximal LCx.  Diffuse mixed plaque throughout RCA, PDA, PLV.  All stenosis appears mild.  FFR did not show any significant stenosis.  Stable aneurysmal dilatation ascending thoracic aorta 4.2 cm. Ascending thoracic aortic dilatation. Hyperlipidemia. Lipid panel 06/02/2022: LDL -14, HDL 43, TG 177, total 64. Emphysema. Anterior uveitis. 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  T2DM. Diet controlled.   History of Present Illness    Daniel W BJuntunenwas first evaluated by Dr. HEllyn Hackon 01/06/2022 for cardiac risk stratification after CT scan of lungs showed coronary artery calcification at the request of PCP.  Coronary calcium scoring showed calcium score of 1685 and proximal ascending thoracic aorta 4.3 cm.  Patient denied chest pain.  He reported baseline DOE.  He had been undergoing several ophthalmic office visits and minor procedures secondary to corneal abrasion.  Secondary to his eye issues he has not been doing his usual walking and found when he recently attempted to walk he could not walk as far and needed to rest.  Coronary CTA with FFR showed nonobstructive CAD.  Patient was last seen in the office by Dr. HEllyn Hackon 04/01/2022.  At that time he was doing well and [place]  He reported some intolerance to Crestor and cut his dose to 20 mg.  At this visit he also mentions the death of his daughter in 2Apr 06, 2022from MI.  Patient was evaluated by Pharm.D.  lipid clinic on 04/10/2022 and started on a PCSK9i.  Patient had hospital admission from 05/12/2022 to 05/15/2022 for pancreatitis likely caused by Imuran for uveitis.  Patient was treated and discharged home with instructions to stop Imuran and follow-up with ophthalmology.  Today, patient is doing well. Patient denies shortness of breath or dyspnea on exertion. No chest pain, pressure, or tightness. Denies lower extremity edema, orthopnea, or PND. No palpitations.  Unfortunately he has not been able to golf secondary to his continued vision problems with uveitis.  He does stay active by walking a 1.7 mile loop in the park 2-3 times a week. He inquires about stopping one of his cholesterol medications since his LDL was so low (he takes rosuvastatin and Repatha). He is not having side effects from either medication but with the other medications he takes for uveitis he would like to take away something. [facility] rosuvast[facility] and continue Repatha. He is due to have a repeat CTA to check on thoracic aortic aneurysm in May. He denies upper back pain, jaw pain, wheezing, hoarseness or difficulty swallowing.    ROS: All other systems reviewed and are otherwise negative except as noted in History of Present Illness.   Physical Exam    VS:  BP 125/75 (BP Location: Left Arm, Patient Position: Sitting, Cuff Size: Normal)   Pulse (!) 57   Ht '5\' 10"'$  (1.778 m)   Wt 229 lb 3.2 oz (104 kg)   SpO2 96%   BMI 32.89 kg/m  ,  BMI Body mass index is 32.89 kg/m.  GEN: Well nourished, well developed, in no acute distress. Neck: No JVD or carotid bruits. Cardiac: RRR. No murmurs. No rubs or gallops.   Respiratory:  Respirations regular and unlabored. Clear to auscultation without rales, wheezing or rhonchi. GI: Soft, nontender, nondistended. Extremities: Radials/DP/PT 2+ and equal bilaterally. No clubbing or cyanosis. No edema.  Skin: Warm and dry, no  rash. Neuro: Strength intact.  Assessment & Plan   Nonobstructive CAD.  Coronary CTA with FFR June 2023 showed nonobstructive CAD.  Patient denies chest pain, pressure, tightness.  He walks [place].7 miles in the park 2-3 times a week. Continue aspirin and Repatha. Ascending thoracic aortic dilatation.  Initially found with CT calcium scoring and shown to be stable with coronary CTA.  Recommendations for annual surveillance.  Patient denies shortness of breath, upper back pain, jaw pain, wheezing, hoarseness or difficulty swallowing.  Will order CTA aorta to be performed May/June 2024. Hyperlipidemia.  LDL October 2023 -14, at goal.  Patient would like to stop one of his cholesterol medicines.  Discussed with Ansel Bong, [place] who agrees patient can stop rosuvastatin.  Continue Repatha.  Disposition: Stop rosuvastatin.  CTA chest aorta to be done in May/June.  Return in 6 months or sooner as needed.         Signed, Justice Britain. Sherrilynn Gudgel, DNP, NP-C

## 2022-10-07 ENCOUNTER — Ambulatory Visit: Payer: Medicare Other | Attending: Cardiology | Admitting: Student

## 2022-10-07 ENCOUNTER — Encounter: Payer: Self-pay | Admitting: Student

## 2022-10-07 VITALS — BP 125/75 | HR 57 | Ht 70.0 in | Wt 229.2 lb

## 2022-10-07 DIAGNOSIS — I7121 Aneurysm of the ascending aorta, without rupture: Secondary | ICD-10-CM

## 2022-10-07 DIAGNOSIS — I2510 Atherosclerotic heart disease of native coronary artery without angina pectoris: Secondary | ICD-10-CM | POA: Diagnosis not present

## 2022-10-07 DIAGNOSIS — E785 Hyperlipidemia, unspecified: Secondary | ICD-10-CM | POA: Diagnosis not present

## 2022-10-07 NOTE — Patient Instructions (Signed)
Medication Instructions:  Your physician has recommended you make the following change in your medication:  STOP: Crestor   *If you need a refill on your cardiac medications before your next appointment, please call your pharmacy*   Lab Work: NONE If you have labs (blood work) drawn today and your tests are completely normal, you will receive your results only by: [place] (if you have MyChart) OR A paper copy in the mail If you have any lab test that is abnormal or we need to change your treatment, we will call you to review the results.   Testing/Procedures: Your physician recommends that you have an CTA of your Chest/Aorta. This will take place at [facility] ( [address] Someone from their office will give you a call to get you scheduled.    Follow-Up: At [facility], you and your health needs are our priority.  As part of our continuing mission to provide you with exceptional heart care, we have created designated Provider Care Teams.  These Care Teams include your primary Cardiologist (physician) and Advanced Practice Providers (APPs -  Physician Assistants and Nurse Practitioners) who all work together to provide you with the care you need, when you need it.  We recommend signing up for the patient portal called "MyChart".  Sign up information is provided on this After Visit Summary.  MyChart is used to connect with patients for Virtual Visits (Telemedicine).  Patients are able to view lab/test results, encounter notes, upcoming appointments, etc.  Non-urgent messages can be sent to your provider as well.   To learn more about what you can do with MyChart, go to [email or web address].    Your next appointment:   6 month(s)  Provider:   Glenetta Hew, MD

## 2022-10-14 DIAGNOSIS — Z9841 Cataract extraction status, right eye: Secondary | ICD-10-CM | POA: Diagnosis not present

## 2022-10-14 DIAGNOSIS — H16001 Unspecified corneal ulcer, right eye: Secondary | ICD-10-CM | POA: Diagnosis not present

## 2022-10-16 DIAGNOSIS — Z961 Presence of intraocular lens: Secondary | ICD-10-CM | POA: Diagnosis not present

## 2022-10-16 DIAGNOSIS — H4051X0 Glaucoma secondary to other eye disorders, right eye, stage unspecified: Secondary | ICD-10-CM | POA: Diagnosis not present

## 2022-10-16 DIAGNOSIS — Z9841 Cataract extraction status, right eye: Secondary | ICD-10-CM | POA: Diagnosis not present

## 2022-10-16 DIAGNOSIS — H16001 Unspecified corneal ulcer, right eye: Secondary | ICD-10-CM | POA: Diagnosis not present

## 2022-10-16 DIAGNOSIS — H209 Unspecified iridocyclitis: Secondary | ICD-10-CM | POA: Diagnosis not present

## 2022-10-20 DIAGNOSIS — H16001 Unspecified corneal ulcer, right eye: Secondary | ICD-10-CM | POA: Diagnosis not present

## 2022-10-20 DIAGNOSIS — H209 Unspecified iridocyclitis: Secondary | ICD-10-CM | POA: Diagnosis not present

## 2022-10-20 DIAGNOSIS — Z9841 Cataract extraction status, right eye: Secondary | ICD-10-CM | POA: Diagnosis not present

## 2022-10-20 DIAGNOSIS — L821 Other seborrheic keratosis: Secondary | ICD-10-CM | POA: Diagnosis not present

## 2022-10-20 DIAGNOSIS — Z85820 Personal history of malignant melanoma of skin: Secondary | ICD-10-CM | POA: Diagnosis not present

## 2022-10-20 DIAGNOSIS — L814 Other melanin hyperpigmentation: Secondary | ICD-10-CM | POA: Diagnosis not present

## 2022-10-20 DIAGNOSIS — H4051X0 Glaucoma secondary to other eye disorders, right eye, stage unspecified: Secondary | ICD-10-CM | POA: Diagnosis not present

## 2022-10-20 DIAGNOSIS — L570 Actinic keratosis: Secondary | ICD-10-CM | POA: Diagnosis not present

## 2022-10-20 DIAGNOSIS — D225 Melanocytic nevi of trunk: Secondary | ICD-10-CM | POA: Diagnosis not present

## 2022-10-21 DIAGNOSIS — H21541 Posterior synechiae (iris), right eye: Secondary | ICD-10-CM | POA: Diagnosis not present

## 2022-10-21 DIAGNOSIS — Z961 Presence of intraocular lens: Secondary | ICD-10-CM | POA: Diagnosis not present

## 2022-10-21 DIAGNOSIS — H3581 Retinal edema: Secondary | ICD-10-CM | POA: Diagnosis not present

## 2022-10-21 DIAGNOSIS — H30033 Focal chorioretinal inflammation, peripheral, bilateral: Secondary | ICD-10-CM | POA: Diagnosis not present

## 2022-10-24 DIAGNOSIS — Z9841 Cataract extraction status, right eye: Secondary | ICD-10-CM | POA: Diagnosis not present

## 2022-10-24 DIAGNOSIS — H209 Unspecified iridocyclitis: Secondary | ICD-10-CM | POA: Diagnosis not present

## 2022-10-24 DIAGNOSIS — H16001 Unspecified corneal ulcer, right eye: Secondary | ICD-10-CM | POA: Diagnosis not present

## 2022-10-24 DIAGNOSIS — H4051X0 Glaucoma secondary to other eye disorders, right eye, stage unspecified: Secondary | ICD-10-CM | POA: Diagnosis not present

## 2022-11-10 DIAGNOSIS — H1045 Other chronic allergic conjunctivitis: Secondary | ICD-10-CM | POA: Diagnosis not present

## 2022-11-10 DIAGNOSIS — J3089 Other allergic rhinitis: Secondary | ICD-10-CM | POA: Diagnosis not present

## 2022-11-10 DIAGNOSIS — J301 Allergic rhinitis due to pollen: Secondary | ICD-10-CM | POA: Diagnosis not present

## 2022-11-10 DIAGNOSIS — K219 Gastro-esophageal reflux disease without esophagitis: Secondary | ICD-10-CM | POA: Diagnosis not present

## 2022-11-11 DIAGNOSIS — Z9841 Cataract extraction status, right eye: Secondary | ICD-10-CM | POA: Diagnosis not present

## 2022-11-11 DIAGNOSIS — H16001 Unspecified corneal ulcer, right eye: Secondary | ICD-10-CM | POA: Diagnosis not present

## 2022-11-11 DIAGNOSIS — Z961 Presence of intraocular lens: Secondary | ICD-10-CM | POA: Diagnosis not present

## 2022-11-11 DIAGNOSIS — H4051X0 Glaucoma secondary to other eye disorders, right eye, stage unspecified: Secondary | ICD-10-CM | POA: Diagnosis not present

## 2022-11-11 DIAGNOSIS — H18821 Corneal disorder due to contact lens, right eye: Secondary | ICD-10-CM | POA: Diagnosis not present

## 2022-11-24 DIAGNOSIS — H5203 Hypermetropia, bilateral: Secondary | ICD-10-CM | POA: Diagnosis not present

## 2022-12-09 DIAGNOSIS — H524 Presbyopia: Secondary | ICD-10-CM | POA: Diagnosis not present

## 2022-12-17 ENCOUNTER — Ambulatory Visit
Admission: RE | Admit: 2022-12-17 | Discharge: 2022-12-17 | Disposition: A | Discharge status: Home / Self Care | Payer: Medicare Other | Source: Ambulatory Visit | Attending: Cardiology | Admitting: Cardiology

## 2022-12-17 DIAGNOSIS — I712 Thoracic aortic aneurysm, without rupture, unspecified: Secondary | ICD-10-CM | POA: Diagnosis not present

## 2022-12-17 DIAGNOSIS — I7121 Aneurysm of the ascending aorta, without rupture: Secondary | ICD-10-CM

## 2022-12-17 MED ORDER — IOPAMIDOL (ISOVUE-370) INJECTION 76%
75.0000 mL | Freq: Once | INTRAVENOUS | Status: AC | PRN
  Administered 2022-12-17: 75 mL via INTRAVENOUS

## 2022-12-23 DIAGNOSIS — H21541 Posterior synechiae (iris), right eye: Secondary | ICD-10-CM | POA: Diagnosis not present

## 2022-12-23 DIAGNOSIS — Z79899 Other long term (current) drug therapy: Secondary | ICD-10-CM | POA: Diagnosis not present

## 2022-12-23 DIAGNOSIS — H30033 Focal chorioretinal inflammation, peripheral, bilateral: Secondary | ICD-10-CM | POA: Diagnosis not present

## 2022-12-23 DIAGNOSIS — H3581 Retinal edema: Secondary | ICD-10-CM | POA: Diagnosis not present

## 2022-12-23 DIAGNOSIS — Z961 Presence of intraocular lens: Secondary | ICD-10-CM | POA: Diagnosis not present

## 2022-12-29 ENCOUNTER — Encounter: Payer: Self-pay | Admitting: Internal Medicine

## 2022-12-29 NOTE — Patient Instructions (Addendum)
      Blood work was ordered.   The lab is on the first floor.    Medications changes include :   none       Return in about 6 months (around 07/03/2023) for follow up.  

## 2022-12-29 NOTE — Progress Notes (Signed)
Subjective:    Patient ID: Daniel Hampton, male    DOB: 04/20/1941, 82 y.o.   MRN: [id number]     HPI Nima is here for a physical exam and his chronic medical problems.   Right upper lung nodule stable with most recent CT scan done by cardiology to follow ascending aortic aneurysm-I will repeat in 2 years-2026  Last A1c 7.3% 05/2022   Medications and allergies reviewed with patient and updated if appropriate.  Current Outpatient Medications on File Prior to Visit  Medication Sig Dispense Refill   aspirin EC 81 MG tablet Take 81 mg by mouth daily. Swallow whole.     cetirizine (ZYRTEC) 10 MG tablet Take 1 tablet (10 mg total) by mouth daily. (Patient taking differently: Take 10 mg by mouth daily as needed.) 30 tablet 11   Coenzyme Q10 (COQ-10 PO) Take 1 tablet by mouth daily.     dorzolamide-timolol (COSOPT) 22.3-6.8 MG/ML ophthalmic solution Place 1 drop into the right eye 2 (two) times daily. (Patient not taking: Reported on 10/07/2022)     Evolocumab (REPATHA SURECLICK) 140 MG/ML SOAJ Inject 1 mL into the skin every 14 (fourteen) days. 6 mL 3   famotidine (PEPCID) 20 MG tablet Take 20 mg by mouth 2 (two) times daily.     fluticasone (FLONASE) 50 MCG/ACT nasal spray Place 2 sprays into both nostrils daily as needed for allergies or rhinitis.     Multiple Vitamins-Minerals (ALIVE MULTI-VITAMIN PO) Take 1 tablet by mouth daily.     ondansetron (ZOFRAN) 4 MG tablet Take 1 tablet (4 mg total) by mouth every 8 (eight) hours as needed for nausea or vomiting. (Patient not taking: Reported on 07/17/2022) 20 tablet 0   prednisoLONE acetate (PRED FORTE) 1 % ophthalmic suspension Place 1 drop into the right eye daily. (Patient not taking: Reported on 10/07/2022)     tamsulosin (FLOMAX) 0.4 MG CAPS capsule Take 0.4 mg by mouth as needed. 1-2 times per week.     No current facility-administered medications on file prior to visit.    Review of Systems     Objective:  There were no vitals filed  for this visit. There were no vitals filed for this visit. There is no height or weight on file to calculate BMI.  BP Readings from Last 3 Encounters:  10/07/22 125/75  07/17/22 100/60  06/02/22 110/64    Wt Readings from Last 3 Encounters:  10/07/22 229 lb 3.2 oz (104 kg)  07/17/22 224 lb 3.2 oz (101.7 kg)  06/02/22 220 lb (99.8 kg)      Physical Exam Constitutional: He appears well-developed and well-nourished. No distress.  HENT:  Head: Normocephalic and atraumatic.  Right Ear: External ear normal.  Left Ear: External ear normal.  Normal ear canals and TM b/l  Mouth/Throat: Oropharynx is clear and moist. Eyes: Conjunctivae and EOM are normal.  Neck: Neck supple. No tracheal deviation present. No thyromegaly present.  No carotid bruit  Cardiovascular: Normal rate, regular rhythm, normal heart sounds and intact distal pulses.   No murmur heard.  No lower extremity edema. Pulmonary/Chest: Effort normal and breath sounds normal. No respiratory distress. He has no wheezes. He has no rales.  Abdominal: Soft. He exhibits no distension. There is no tenderness.  Genitourinary: deferred  Lymphadenopathy:   He has no cervical adenopathy.  Skin: Skin is warm and dry. He is not diaphoretic.  Psychiatric: He has a normal mood and affect. His behavior is normal.  Assessment & Plan:   Physical exam: Screening blood work  ordered Exercise    Weight   Substance abuse   none   Reviewed recommended immunizations.   Health Maintenance  Topic Date Due   Colonoscopy  01/30/2021   Medicare Annual Wellness (AWV)  03/05/2021   COVID-19 Vaccine (4 - 2023-24 season) 04/04/2022   INFLUENZA VACCINE  03/05/2023   DTaP/Tdap/Td (3 - Td or Tdap) 08/27/2031   Pneumonia Vaccine 53+ Years old  Completed   HPV VACCINES  Aged Out   Zoster Vaccines- Shingrix  Discontinued     See Problem List for Assessment and Plan of chronic medical problems.

## 2022-12-30 NOTE — Assessment & Plan Note (Signed)
Right upper lung nodule stable Advised to follow every 2 years x 5 years to ensure stability-which would be until 2027 Next CT due 12/2024 which I will order

## 2022-12-31 ENCOUNTER — Ambulatory Visit (INDEPENDENT_AMBULATORY_CARE_PROVIDER_SITE_OTHER): Payer: Medicare Other | Admitting: Internal Medicine

## 2022-12-31 VITALS — BP 118/74 | HR 75 | Temp 98.2°F | Ht 70.0 in | Wt 230.0 lb

## 2022-12-31 DIAGNOSIS — G720 Drug-induced myopathy: Secondary | ICD-10-CM | POA: Insufficient documentation

## 2022-12-31 DIAGNOSIS — E1169 Type 2 diabetes mellitus with other specified complication: Secondary | ICD-10-CM | POA: Diagnosis not present

## 2022-12-31 DIAGNOSIS — R911 Solitary pulmonary nodule: Secondary | ICD-10-CM

## 2022-12-31 DIAGNOSIS — I2510 Atherosclerotic heart disease of native coronary artery without angina pectoris: Secondary | ICD-10-CM

## 2022-12-31 DIAGNOSIS — Z0000 Encounter for general adult medical examination without abnormal findings: Secondary | ICD-10-CM | POA: Diagnosis not present

## 2022-12-31 DIAGNOSIS — E785 Hyperlipidemia, unspecified: Secondary | ICD-10-CM

## 2022-12-31 DIAGNOSIS — K219 Gastro-esophageal reflux disease without esophagitis: Secondary | ICD-10-CM

## 2022-12-31 LAB — CBC WITH DIFFERENTIAL/PLATELET
Basophils Absolute: 0.1 10*3/uL (ref 0.0–0.1)
Basophils Relative: 0.8 % (ref 0.0–3.0)
Eosinophils Absolute: 0.2 10*3/uL (ref 0.0–0.7)
Eosinophils Relative: 2.2 % (ref 0.0–5.0)
HCT: 44.6 % (ref 39.0–52.0)
Hemoglobin: 15.3 g/dL (ref 13.0–17.0)
Lymphocytes Relative: 37.1 % (ref 12.0–46.0)
Lymphs Abs: 2.8 10*3/uL (ref 0.7–4.0)
MCHC: 34.3 g/dL (ref 30.0–36.0)
MCV: 97.4 fl (ref 78.0–100.0)
Monocytes Absolute: 0.5 10*3/uL (ref 0.1–1.0)
Monocytes Relative: 6.9 % (ref 3.0–12.0)
Neutro Abs: 4 10*3/uL (ref 1.4–7.7)
Neutrophils Relative %: 53 % (ref 43.0–77.0)
Platelets: 237 10*3/uL (ref 150.0–400.0)
RBC: 4.58 Mil/uL (ref 4.22–5.81)
RDW: 16.5 % — ABNORMAL HIGH (ref 11.5–15.5)
WBC: 7.5 10*3/uL (ref 4.0–10.5)

## 2022-12-31 LAB — LIPID PANEL
Cholesterol: 158 mg/dL (ref 0–200)
HDL: 47.3 mg/dL (ref >39.00)
LDL Cholesterol: 84 mg/dL (ref 0–99)
NonHDL: 110.78
Total CHOL/HDL Ratio: 3
Triglycerides: 135 mg/dL (ref 0.0–149.0)
VLDL: 27 mg/dL (ref 0.0–40.0)

## 2022-12-31 LAB — COMPREHENSIVE METABOLIC PANEL
ALT: 20 U/L (ref 0–53)
AST: 23 U/L (ref 0–37)
Albumin: 4.3 g/dL (ref 3.5–5.2)
Alkaline Phosphatase: 48 U/L (ref 39–117)
BUN: 18 mg/dL (ref 6–23)
CO2: 29 mEq/L (ref 19–32)
Calcium: 9.2 mg/dL (ref 8.4–10.5)
Chloride: 104 mEq/L (ref 96–112)
Creatinine, Ser: 0.97 mg/dL (ref 0.40–1.50)
GFR: 72.46 mL/min (ref >60.00)
Glucose, Bld: 90 mg/dL (ref 70–99)
Potassium: 3.8 mEq/L (ref 3.5–5.1)
Sodium: 142 mEq/L (ref 135–145)
Total Bilirubin: 0.7 mg/dL (ref 0.2–1.2)
Total Protein: 7.1 g/dL (ref 6.0–8.3)

## 2022-12-31 LAB — MICROALBUMIN / CREATININE URINE RATIO
Creatinine,U: 110.2 mg/dL
Microalb Creat Ratio: 0.7 mg/g (ref 0.0–30.0)
Microalb, Ur: 0.7 mg/dL (ref 0.0–1.9)

## 2022-12-31 LAB — HEMOGLOBIN A1C: Hgb A1c MFr Bld: 6.1 % (ref 4.6–6.5)

## 2022-12-31 NOTE — Assessment & Plan Note (Signed)
Chronic GERD controlled Continue famotidine 20 mg bid  

## 2022-12-31 NOTE — Assessment & Plan Note (Signed)
Chronic Check lipid panel, cmp Continue Repatha 140 mg q. 14 days Regular exercise and healthy diet encouraged

## 2022-12-31 NOTE — Assessment & Plan Note (Signed)
Chronic New diagnosis last fall  Lab Results  Component Value Date   HGBA1C 7.3 (H) 06/02/2022   Sugars not ideally controlled Currently on lifestyle control Stressed regular exercise, diabetic diet

## 2022-12-31 NOTE — Assessment & Plan Note (Signed)
Chronic Following with Dr. Herbie [place] On Repatha 140 mg Q 14 days Continue regular walking

## 2023-01-02 ENCOUNTER — Encounter: Payer: Self-pay | Admitting: Internal Medicine

## 2023-01-02 DIAGNOSIS — E782 Mixed hyperlipidemia: Secondary | ICD-10-CM

## 2023-01-04 MED ORDER — ROSUVASTATIN CALCIUM 5 MG PO TABS
5.0000 mg | ORAL_TABLET | ORAL | 3 refills | Status: DC
Start: 2023-01-05 — End: 2023-08-10

## 2023-01-12 DIAGNOSIS — H30033 Focal chorioretinal inflammation, peripheral, bilateral: Secondary | ICD-10-CM | POA: Diagnosis not present

## 2023-01-12 DIAGNOSIS — H4051X0 Glaucoma secondary to other eye disorders, right eye, stage unspecified: Secondary | ICD-10-CM | POA: Diagnosis not present

## 2023-01-12 DIAGNOSIS — Z961 Presence of intraocular lens: Secondary | ICD-10-CM | POA: Diagnosis not present

## 2023-01-12 DIAGNOSIS — H3581 Retinal edema: Secondary | ICD-10-CM | POA: Diagnosis not present

## 2023-01-12 DIAGNOSIS — H16001 Unspecified corneal ulcer, right eye: Secondary | ICD-10-CM | POA: Diagnosis not present

## 2023-01-14 ENCOUNTER — Encounter: Payer: Self-pay | Admitting: Gastroenterology

## 2023-01-19 DIAGNOSIS — H30033 Focal chorioretinal inflammation, peripheral, bilateral: Secondary | ICD-10-CM | POA: Diagnosis not present

## 2023-01-19 DIAGNOSIS — Z79899 Other long term (current) drug therapy: Secondary | ICD-10-CM | POA: Diagnosis not present

## 2023-01-19 DIAGNOSIS — H16001 Unspecified corneal ulcer, right eye: Secondary | ICD-10-CM | POA: Diagnosis not present

## 2023-01-19 DIAGNOSIS — Z961 Presence of intraocular lens: Secondary | ICD-10-CM | POA: Diagnosis not present

## 2023-01-23 DIAGNOSIS — H16001 Unspecified corneal ulcer, right eye: Secondary | ICD-10-CM | POA: Diagnosis not present

## 2023-01-23 DIAGNOSIS — H4051X0 Glaucoma secondary to other eye disorders, right eye, stage unspecified: Secondary | ICD-10-CM | POA: Diagnosis not present

## 2023-01-23 DIAGNOSIS — H30033 Focal chorioretinal inflammation, peripheral, bilateral: Secondary | ICD-10-CM | POA: Diagnosis not present

## 2023-01-23 DIAGNOSIS — Z961 Presence of intraocular lens: Secondary | ICD-10-CM | POA: Diagnosis not present

## 2023-01-23 DIAGNOSIS — H3581 Retinal edema: Secondary | ICD-10-CM | POA: Diagnosis not present

## 2023-02-03 DIAGNOSIS — H30033 Focal chorioretinal inflammation, peripheral, bilateral: Secondary | ICD-10-CM | POA: Diagnosis not present

## 2023-02-03 DIAGNOSIS — H4051X0 Glaucoma secondary to other eye disorders, right eye, stage unspecified: Secondary | ICD-10-CM | POA: Diagnosis not present

## 2023-02-03 DIAGNOSIS — Z961 Presence of intraocular lens: Secondary | ICD-10-CM | POA: Diagnosis not present

## 2023-02-03 DIAGNOSIS — X58XXXS Exposure to other specified factors, sequela: Secondary | ICD-10-CM | POA: Diagnosis not present

## 2023-02-03 DIAGNOSIS — S0501XD Injury of conjunctiva and corneal abrasion without foreign body, right eye, subsequent encounter: Secondary | ICD-10-CM | POA: Diagnosis not present

## 2023-02-03 DIAGNOSIS — Z79899 Other long term (current) drug therapy: Secondary | ICD-10-CM | POA: Diagnosis not present

## 2023-02-03 DIAGNOSIS — H209 Unspecified iridocyclitis: Secondary | ICD-10-CM | POA: Diagnosis not present

## 2023-02-03 DIAGNOSIS — S0501XS Injury of conjunctiva and corneal abrasion without foreign body, right eye, sequela: Secondary | ICD-10-CM | POA: Diagnosis not present

## 2023-02-03 NOTE — Progress Notes (Signed)
Chief Complaint: Discuss colonoscopy Primary GI MD: Dr. Leone Payor  HPI: 82 year old male history of melanoma, vertigo, hyperlipidemia, asthma, presents for evaluation of repeat colonoscopy.  See previous colonoscopies below  Patient states he was supposed to come back in 2022 but was dealing with extrensic issues which prevented him from coming in.  States he is doing well and has no GI issues today. Currently being seen at [facility] for glaucoma and treatment is going well. He hopes to golf again soon but still remains very active. Denies blood thinner use.  PREVIOUS GI WORKUP   Colonoscopy 01/31/2020 done for history of colon polyps: 10 tubular adenomas in sigmoid colon, descending colon, transverse colon, cecum.  Severe diverticulosis in sigmoid, descending, transverse, ascending colon.  Recall 1 year  Colonoscopy 2015: 3 diminutive sessile polyps in cecum and transverse colon, severe diverticulosis in sigmoid colon, internal hemorrhoids  Colonoscopies in 2010, 2005, 2000, 1996, 1993, 1991, 1989  Past Medical History:  Diagnosis Date   Allergy    SEASONAL   Arthritis    Asthma    COLONIC POLYPS, HX OF 03/12/2007   DISC DISEASE, LUMBAR 12/03/2008   DIVERTICULOSIS, COLON 10/17/2009   History of kidney stones    Hydrocele    HYPERLIPIDEMIA 03/12/2007   Hyphema of right eye    Complicated by allergic conjunctivitis   Melanoma (HCC) 04/23/2020   in situ-Left Upper Arm   MORTON'S NEUROMA, RIGHT 03/13/2008   SCC (squamous cell carcinoma) 04/26/2013   right hand-txpbx   SKIN CANCER, HX OF 10/17/2009   VERTIGO, BENIGN PAROXYSMAL POSITION 04/28/2007    Past Surgical History:  Procedure Laterality Date   BACK SURGERY     COLONOSCOPY W/ BIOPSIES     CT CTA CORONARY W/CA SCORE W/CM &/OR WO/CM  01/27/2022   1. Coronary artery calcium score 1540 Agatston units. This places the patient in the 76th percentile for age and gender, suggesting intermediate risk for future events.  2.  Extensive coronary disease but appears nonobstructive. This is confirmed by CT FFR. 3. 4.2 cm dilation of the ascending aorta,   EYE SURGERY Left    LUMBAR LAMINECTOMY/ DECOMPRESSION WITH MET-RX Left 03/01/2021   Procedure: LEFT LUMBAR FOUR-FIVE MINIMALLY INVASIVE LAMINECTOMY, LATERAL RECESS DECOMPRESSION AND RESECTION OF SYNOVIAL CYST;  Surgeon: Bethann Goo, DO;  Location: [facility] OR;  Service: Neurosurgery;  Laterality: Left;   MOHS SURGERY     SPINE SURGERY     lumbar disk surgery    Current Outpatient Medications  Medication Sig Dispense Refill   aspirin EC 81 MG tablet Take 81 mg by mouth daily. Swallow whole.     cetirizine (ZYRTEC) 10 MG tablet Take 1 tablet (10 mg total) by mouth daily. (Patient taking differently: Take 10 mg by mouth daily as needed.) 30 tablet 11   Coenzyme Q10 (COQ-10 PO) Take 1 tablet by mouth daily.     Evolocumab (REPATHA SURECLICK) 140 MG/ML SOAJ Inject 1 mL into the skin every 14 (fourteen) days. 6 mL 3   famotidine (PEPCID) 20 MG tablet Take 20 mg by mouth 2 (two) times daily.     fluticasone (FLONASE) 50 MCG/ACT nasal spray Place 2 sprays into both nostrils daily as needed for allergies or rhinitis.     Multiple Vitamins-Minerals (ALIVE MULTI-VITAMIN PO) Take 1 tablet by mouth daily.     rosuvastatin (CRESTOR) 5 MG tablet Take 1 tablet (5 mg total) by mouth 3 (three) times a week. 13 tablet 3   tamsulosin (FLOMAX) 0.4 MG CAPS  capsule Take 0.4 mg by mouth as needed. 1-2 times per week.     valACYclovir (VALTREX) 1000 MG tablet Take by mouth.     No current facility-administered medications for this visit.    Allergies as of 02/06/2023 - Review Complete 12/29/2022  Allergen Reaction Noted   Atorvastatin Other (See Comments) 04/10/2022   Crestor [rosuvastatin] Other (See Comments) 04/10/2022   Imuran [azathioprine] Other (See Comments) 06/02/2022   Tape Rash 09/24/2021    Family History  Problem Relation Age of Onset   Lung cancer Mother    Ovarian  cancer Mother    COPD Father    Heart attack Father    Alcohol abuse Brother    Lung cancer Brother        smoker   Breast cancer Daughter    Breast cancer Daughter    Colon cancer Neg Hx    Esophageal cancer Neg Hx    Rectal cancer Neg Hx    Stomach cancer Neg Hx     Social History   Socioeconomic History   Marital status: Married    Spouse name: Not on file   Number of children: 3   Years of education: Not on file   Highest education level: Not on file  Occupational History   Occupation: Retired  Tobacco Use   Smoking status: Former    Packs/day: 1.00    Years: 20.00    Additional pack years: 0.00    Total pack years: 20.00    Types: Cigarettes    Quit date: 10/30/1978    Years since quitting: 44.2   Smokeless tobacco: Never  Vaping Use   Vaping Use: Never used  Substance and Sexual Activity   Alcohol use: Yes    Comment: 1 burben and 1 beer    Drug use: No   Sexual activity: Not on file  Other Topics Concern   Not on file  Social History Narrative   Retired from [employer] at [employer]   Enjoys golfing   Three daughters   Married     Right Handed    Has 2 granddaughters 1 grandson    Social Determinants of Health   Financial Resource Strain: Low Risk  (03/05/2020)   Overall Financial Resource Strain (CARDIA)    Difficulty of Paying Living Expenses: Not hard at all  Food Insecurity: No Food Insecurity (05/21/2022)   Hunger Vital Sign    Worried About Running Out of Food in the Last Year: Never true    Ran Out of Food in the Last Year: Never true  Transportation Needs: No Transportation Needs (05/21/2022)   PRAPARE - Administrator, Civil Service (Medical): No    Lack of Transportation (Non-Medical): No  Physical Activity: Sufficiently Active (03/05/2020)   Exercise Vital Sign    Days of Exercise per Week: 5 days    Minutes of Exercise per Session: 60 min  Stress: No Stress Concern Present (03/05/2020)   [employer] of  Occupational Health - Occupational Stress Questionnaire    Feeling of Stress : Not at all  Social Connections: Unknown (03/05/2020)   Social Connection and Isolation Panel [NHANES]    Frequency of Communication with Friends and Family: More than three times a week    Frequency of Social Gatherings with Friends and Family: Once a week    Attends Religious Services: Patient declined    Database administrator or Organizations: Yes    Attends Banker Meetings:  Patient declined    Marital Status: Married  Catering manager Violence: Not At Risk (05/12/2022)   Humiliation, Afraid, Rape, and Kick questionnaire    Fear of Current or Ex-Partner: No    Emotionally Abused: No    Physically Abused: No    Sexually Abused: No    Review of Systems:    Constitutional: No weight loss, fever, chills, weakness or fatigue HEENT: Eyes: No change in vision               Ears, Nose, Throat:  No change in hearing or congestion Skin: No rash or itching Cardiovascular: No chest pain, chest pressure or palpitations   Respiratory: No SOB or cough Gastrointestinal: See HPI and otherwise negative Genitourinary: No dysuria or change in urinary frequency Neurological: No headache, dizziness or syncope Musculoskeletal: No new muscle or joint pain Hematologic: No bleeding or bruising Psychiatric: No history of depression or anxiety    Physical Exam:  Vital signs: There were no vitals taken for this visit.  Constitutional: NAD, Well developed, Well nourished, alert and cooperative Head:  Normocephalic and atraumatic. Eyes:   PEERL, EOMI. No icterus. Conjunctiva pink. Respiratory: Respirations even and unlabored. Lungs clear to auscultation bilaterally.   No wheezes, crackles, or rhonchi.  Cardiovascular:  Regular rate and rhythm. No peripheral edema, cyanosis or pallor.  Gastrointestinal:  Soft, nondistended, nontender. No rebound or guarding. Normal bowel sounds. No appreciable masses or  hepatomegaly. Rectal:  Not performed.  Msk:  Symmetrical without gross deformities. Without edema, no deformity or joint abnormality.  Neurologic:  Alert and  oriented x4;  grossly normal neurologically.  Skin:   Dry and intact without significant lesions or rashes. Psychiatric: Oriented to person, place and time. Demonstrates good judgement and reason without abnormal affect or behaviors.   RELEVANT LABS AND IMAGING: CBC    Component Value Date/Time   WBC 7.5 12/31/2022 0911   RBC 4.58 12/31/2022 0911   HGB 15.3 12/31/2022 0911   HCT 44.6 12/31/2022 0911   PLT 237.0 12/31/2022 0911   MCV 97.4 12/31/2022 0911   MCH 30.8 05/14/2022 0416   MCHC 34.3 12/31/2022 0911   RDW 16.5 (H) 12/31/2022 0911   LYMPHSABS 2.8 12/31/2022 0911   MONOABS 0.5 12/31/2022 0911   EOSABS 0.2 12/31/2022 0911   BASOSABS 0.1 12/31/2022 0911    CMP     Component Value Date/Time   NA 142 12/31/2022 0911   NA 141 01/17/2022 1011   K 3.8 12/31/2022 0911   CL 104 12/31/2022 0911   CO2 29 12/31/2022 0911   GLUCOSE 90 12/31/2022 0911   GLUCOSE 95 07/24/2006 1148   BUN 18 12/31/2022 0911   BUN 15 01/17/2022 1011   CREATININE 0.97 12/31/2022 0911   CALCIUM 9.2 12/31/2022 0911   PROT 7.1 12/31/2022 0911   ALBUMIN 4.3 12/31/2022 0911   AST 23 12/31/2022 0911   ALT 20 12/31/2022 0911   ALKPHOS 48 12/31/2022 0911   BILITOT 0.7 12/31/2022 0911   GFRNONAA >60 05/14/2022 0416   GFRAA 108 09/18/2008 0000    Assessment: 1. History of colon polyps  Plan: 1. Set up for colonoscopy 2. I thoroughly discussed the procedure with the patient (at bedside) to include nature of the procedure, alternatives, benefits, and risks (including but not limited to bleeding, infection, perforation, anesthesia/cardiac pulmonary complications).  Patient verbalized understanding and gave verbal consent to proceed with Colonoscopy.   Lara Mulch [facility] 02/03/2023, 3:44 PM  Cc: Pincus Sanes, MD

## 2023-02-06 ENCOUNTER — Encounter: Payer: Self-pay | Admitting: Gastroenterology

## 2023-02-06 ENCOUNTER — Ambulatory Visit: Payer: Medicare Other | Admitting: Gastroenterology

## 2023-02-06 VITALS — BP 136/68 | HR 82 | Ht 70.0 in | Wt 229.0 lb

## 2023-02-06 DIAGNOSIS — Z800 Family history of malignant neoplasm of digestive organs: Secondary | ICD-10-CM

## 2023-02-06 MED ORDER — NA SULFATE-K SULFATE-MG SULF 17.5-3.13-1.6 GM/177ML PO SOLN: 1.0000 | Freq: Once | ORAL | 0 refills | Status: AC

## 2023-02-06 NOTE — Patient Instructions (Signed)
You have been scheduled for a colonoscopy. Please follow written instructions given to you at your visit today.   Please pick up your prep supplies at the pharmacy within the next 1-3 days.  If you use inhalers (even only as needed), please bring them with you on the day of your procedure.  DO NOT TAKE 7 DAYS PRIOR TO TEST- Trulicity (dulaglutide) Ozempic, Wegovy (semaglutide) Mounjaro (tirzepatide) Bydureon Bcise (exanatide extended release)  DO NOT TAKE 1 DAY PRIOR TO YOUR TEST Rybelsus (semaglutide) Adlyxin (lixisenatide) Victoza (liraglutide) Byetta (exanatide) ___________________________________________________________________________   Due to recent changes in healthcare laws, you may see the results of your imaging and laboratory studies on MyChart before your provider has had a chance to review them.  We understand that in some cases there may be results that are confusing or concerning to you. Not all laboratory results come back in the same time frame and the provider may be waiting for multiple results in order to interpret others.  Please give [place] 48 hours in order for your provider to thoroughly review all the results before contacting the office for clarification of your results.    I appreciate the  opportunity to care for you  Thank You   [facility]

## 2023-02-16 ENCOUNTER — Other Ambulatory Visit (INDEPENDENT_AMBULATORY_CARE_PROVIDER_SITE_OTHER): Payer: Medicare Other

## 2023-02-16 DIAGNOSIS — E782 Mixed hyperlipidemia: Secondary | ICD-10-CM | POA: Diagnosis not present

## 2023-02-16 LAB — HEPATIC FUNCTION PANEL
ALT: 21 U/L (ref 0–53)
AST: 25 U/L (ref 0–37)
Albumin: 4.2 g/dL (ref 3.5–5.2)
Alkaline Phosphatase: 48 U/L (ref 39–117)
Bilirubin, Direct: 0.2 mg/dL (ref 0.0–0.3)
Total Bilirubin: 0.8 mg/dL (ref 0.2–1.2)
Total Protein: 6.8 g/dL (ref 6.0–8.3)

## 2023-02-16 LAB — LIPID PANEL
Cholesterol: 95 mg/dL (ref 0–200)
HDL: 47.2 mg/dL (ref >39.00)
LDL Cholesterol: 22 mg/dL (ref 0–99)
NonHDL: 48.23
Total CHOL/HDL Ratio: 2
Triglycerides: 133 mg/dL (ref 0.0–149.0)
VLDL: 26.6 mg/dL (ref 0.0–40.0)

## 2023-02-22 ENCOUNTER — Encounter: Payer: Self-pay | Admitting: Certified Registered Nurse Anesthetist

## 2023-02-23 ENCOUNTER — Ambulatory Visit (INDEPENDENT_AMBULATORY_CARE_PROVIDER_SITE_OTHER): Payer: Medicare Other

## 2023-02-23 DIAGNOSIS — Z0000 Encounter for general adult medical examination without abnormal findings: Secondary | ICD-10-CM | POA: Diagnosis not present

## 2023-02-23 NOTE — Progress Notes (Signed)
Subjective:   Daniel Hampton is a 82 y.o. male who presents for Medicare Annual/Subsequent preventive examination.  Visit Complete: Virtual  I connected with  ANTHONYJAMES [name] on 02/23/23 by a audio enabled telemedicine application and verified that I am speaking with the correct person using two identifiers.  Patient Location: Home  Provider Location: Office/Clinic  I discussed the limitations of evaluation and management by telemedicine. The patient expressed understanding and agreed to proceed.  Review of Systems    Defer to PCP Cardiac Risk Factors include: male gender;obesity (BMI >30kg/m2);advanced age (>58men, >26 women) Patient was unable to self-report due to a lack of equipment at home via telehealth    Objective:    There were no vitals filed for this visit. There is no height or weight on file to calculate BMI.     02/23/2023    3:39 PM 05/11/2022   11:35 PM 02/26/2021    8:10 AM 10/29/2020    7:53 AM 03/05/2020    9:16 AM  Advanced Directives  Does Patient Have a Medical Advance Directive? No No No No No  Would patient like information on creating a medical advance directive? No - Patient declined No - Patient declined   No - Patient declined    Current Medications (verified) Outpatient Encounter Medications as of 02/23/2023  Medication Sig   aspirin EC 81 MG tablet Take 81 mg by mouth daily. Swallow whole.   cetirizine (ZYRTEC) 10 MG tablet Take 1 tablet (10 mg total) by mouth daily. (Patient taking differently: Take 10 mg by mouth daily as needed.)   Coenzyme Q10 (COQ-10 PO) Take 1 tablet by mouth daily.   Evolocumab (REPATHA SURECLICK) 140 MG/ML SOAJ Inject 1 mL into the skin every 14 (fourteen) days.   famotidine (PEPCID) 20 MG tablet Take 20 mg by mouth 2 (two) times daily.   fluticasone (FLONASE) 50 MCG/ACT nasal spray Place 2 sprays into both nostrils daily as needed for allergies or rhinitis.   Multiple Vitamins-Minerals (ALIVE MULTI-VITAMIN PO) Take 1 tablet by  mouth daily.   rosuvastatin (CRESTOR) 5 MG tablet Take 1 tablet (5 mg total) by mouth 3 (three) times a week.   tamsulosin (FLOMAX) 0.4 MG CAPS capsule Take 0.4 mg by mouth as needed. 1-2 times per week.   valACYclovir (VALTREX) 1000 MG tablet Take by mouth.   No facility-administered encounter medications on file as of 02/23/2023.    Allergies (verified) Atorvastatin, Crestor [rosuvastatin], Imuran [azathioprine], and Tape   History: Past Medical History:  Diagnosis Date   Allergy    SEASONAL   Arthritis    Asthma    COLONIC POLYPS, HX OF 03/12/2007   DISC DISEASE, LUMBAR 12/03/2008   DIVERTICULOSIS, COLON 10/17/2009   History of kidney stones    Hydrocele    HYPERLIPIDEMIA 03/12/2007   Hyphema of right eye    Complicated by allergic conjunctivitis   Melanoma (HCC) 04/23/2020   in situ-Left Upper Arm   MORTON'S NEUROMA, RIGHT 03/13/2008   SCC (squamous cell carcinoma) 04/26/2013   right hand-txpbx   SKIN CANCER, HX OF 10/17/2009   VERTIGO, BENIGN PAROXYSMAL POSITION 04/28/2007   Past Surgical History:  Procedure Laterality Date   BACK SURGERY     COLONOSCOPY W/ BIOPSIES     CT CTA CORONARY W/CA SCORE W/CM &/OR WO/CM  01/27/2022   1. Coronary artery calcium score 1540 Agatston units. This places the patient in the 76th percentile for age and gender, suggesting intermediate risk for future events.  2. Extensive coronary disease but appears nonobstructive. This is confirmed by CT FFR. 3. 4.2 cm dilation of the ascending aorta,   EYE SURGERY Left    LUMBAR LAMINECTOMY/ DECOMPRESSION WITH MET-RX Left 03/01/2021   Procedure: LEFT LUMBAR FOUR-FIVE MINIMALLY INVASIVE LAMINECTOMY, LATERAL RECESS DECOMPRESSION AND RESECTION OF SYNOVIAL CYST;  Surgeon: Bethann Goo, DO;  Location: [facility] OR;  Service: Neurosurgery;  Laterality: Left;   MOHS SURGERY     SPINE SURGERY     lumbar disk surgery   Family History  Problem Relation Age of Onset   Lung cancer Mother    Ovarian cancer  Mother    COPD Father    Heart attack Father    Alcohol abuse Brother    Lung cancer Brother        smoker   Breast cancer Daughter    Breast cancer Daughter    Colon cancer Neg Hx    Esophageal cancer Neg Hx    Rectal cancer Neg Hx    Stomach cancer Neg Hx    Social History   Socioeconomic History   Marital status: Married    Spouse name: Not on file   Number of children: 3   Years of education: Not on file   Highest education level: Not on file  Occupational History   Occupation: Retired  Tobacco Use   Smoking status: Former    Current packs/day: 0.00    Average packs/day: 1 pack/day for 20.0 years (20.0 ttl pk-yrs)    Types: Cigarettes    Start date: 10/30/1958    Quit date: 10/30/1978    Years since quitting: 44.3   Smokeless tobacco: Never  Vaping Use   Vaping status: Never Used  Substance and Sexual Activity   Alcohol use: Yes    Comment: 1 burben and 1 beer    Drug use: No   Sexual activity: Not on file  Other Topics Concern   Not on file  Social History Narrative   Retired from [employer] at [employer]   Enjoys golfing   Three daughters   Married     Right Handed    Has 2 granddaughters 1 grandson    Social Determinants of Health   Financial Resource Strain: Low Risk  (02/23/2023)   Overall Financial Resource Strain (CARDIA)    Difficulty of Paying Living Expenses: Not hard at all  Food Insecurity: No Food Insecurity (02/23/2023)   Hunger Vital Sign    Worried About Running Out of Food in the Last Year: Never true    Ran Out of Food in the Last Year: Never true  Transportation Needs: No Transportation Needs (02/23/2023)   PRAPARE - Administrator, Civil Service (Medical): No    Lack of Transportation (Non-Medical): No  Physical Activity: Sufficiently Active (02/23/2023)   Exercise Vital Sign    Days of Exercise per Week: 5 days    Minutes of Exercise per Session: 60 min  Stress: No Stress Concern Present (02/23/2023)   [employer] of Occupational Health - Occupational Stress Questionnaire    Feeling of Stress : Not at all  Social Connections: Socially Integrated (02/23/2023)   Social Connection and Isolation Panel [NHANES]    Frequency of Communication with Friends and Family: Three times a week    Frequency of Social Gatherings with Friends and Family: Three times a week    Attends Religious Services: 1 to 4 times per year    Active Member of Clubs  or Organizations: Yes    Attends Banker Meetings: 1 to 4 times per year    Marital Status: Married    Tobacco Counseling Counseling given: Not Answered   Clinical Intake:  Pre-visit preparation completed: Yes  Pain : No/denies pain     BMI - recorded: 32.86 Nutritional Status: BMI > 30  Obese Nutritional Risks: None Diabetes: No (pt stated he is no diabetic)  How often do you need to have someone help you when you read instructions, pamphlets, or other written materials from your doctor or pharmacy?: 1 - Never What is the last grade level you completed in school?: 12th grade  Interpreter Needed?: No      Activities of Daily Living    02/23/2023    2:25 PM 05/12/2022    8:00 AM  In your present state of health, do you have any difficulty performing the following activities:  Hearing? 0 0  Vision? 0 0  Difficulty concentrating or making decisions? 0 1  Walking or climbing stairs? 0 0  Dressing or bathing? 0 0  Doing errands, shopping? 0 0  Preparing Food and eating ? N   Using the Toilet? N   In the past six months, have you accidently leaked urine? N   Do you have problems with loss of bowel control? N   Managing your Medications? N   Managing your Finances? N   Housekeeping or managing your Housekeeping? N     Patient Care Team: Pincus Sanes, MD as PCP - General (Internal Medicine) Marykay Lex, MD as PCP - Cardiology (Cardiology) Andrena Mews, DO as Consulting Physician (Sports Medicine) Jerilee Field, MD as Consulting Physician (Urology) Irena Cords, Enzo Montgomery, MD as Consulting Physician (Allergy and Immunology) Janalyn Harder, MD (Inactive) as Consulting Physician (Dermatology)  Indicate any recent Medical Services you may have received from other than [facility] providers in the past year (date may be approximate).     Assessment:   This is a routine wellness examination for Daniel Hampton.  Hearing/Vision screen Vision Screening - Comments:: Sees eye provider  Dietary issues and exercise activities discussed:     Goals Addressed               This Visit's Progress     Patient Stated (pt-stated)        He wants to keep his cholesterol down and stay healthy       Depression Screen    02/23/2023    3:38 PM 12/31/2022    8:46 AM 06/02/2022    1:51 PM 01/29/2022    9:33 AM 08/13/2021   10:19 AM 03/05/2020    9:13 AM 04/27/2019    2:23 PM  PHQ 2/9 Scores  PHQ - 2 Score 0 0 0 0 0 0 0  PHQ- 9 Score 0 0   0      Fall Risk    02/23/2023    3:40 PM 12/31/2022    8:45 AM 06/02/2022    1:51 PM 10/29/2020    7:53 AM 03/05/2020    9:16 AM  Fall Risk   Falls in the past year? 0 0 0 0 0  Number falls in past yr: 0 0 0 0 0  Injury with Fall? 0 0 0 0 0  Risk for fall due to : No Fall Risks No Fall Risks No Fall Risks  No Fall Risks  Follow up Falls evaluation completed Falls evaluation completed Falls evaluation completed  Falls evaluation  completed    MEDICARE RISK AT HOME:  Medicare Risk at Home - 02/23/23 1540     Any stairs in or around the home? Yes    If so, are there any without handrails? No    Home free of loose throw rugs in walkways, pet beds, electrical cords, etc? Yes    Adequate lighting in your home to reduce risk of falls? Yes    Life alert? No   apple watch   Use of a cane, walker or w/c? No    Grab bars in the bathroom? Yes    Shower chair or bench in shower? Yes    Elevated toilet seat or a handicapped toilet? Yes             TIMED UP AND GO:  Was the  test performed?  No    Cognitive Function:        02/23/2023    3:40 PM  6CIT Screen  What Year? 0 points  What month? 0 points  What time? 0 points  Count back from 20 0 points  Months in reverse 0 points  Repeat phrase 0 points  Total Score 0 points    Immunizations Immunization History  Administered Date(s) Administered   Fluad Quad(high Dose 65+) 05/10/2019, 06/01/2021, 05/13/2022   Influenza Split 04/14/2012, 05/31/2013   Influenza Whole 08/04/2005, 07/19/2007   Influenza, High Dose Seasonal PF 06/05/2015, 07/01/2016, 05/12/2018, 05/23/2020   Influenza,inj,Quad PF,6+ Mos 06/06/2017   Influenza-Unspecified 05/04/2014, 06/05/2015   PFIZER(Purple Top)SARS-COV-2 Vaccination 08/26/2019, 09/13/2019, 06/05/2020   Pneumococcal Conjugate-13 08/08/2014   Pneumococcal Polysaccharide-23 08/04/2004, 12/31/2010, 09/29/2019   Td 08/04/2006   Tdap 08/26/2021   Zoster, Live 04/04/2012    TDAP status: Up to date  Flu Vaccine status: Up to date  Pneumococcal vaccine status: Up to date  Covid-19 vaccine status: Completed vaccines  Qualifies for Shingles Vaccine? No   Zostavax completed No   Shingrix Completed?: No.    Education has been provided regarding the importance of this vaccine. Patient has been advised to call insurance company to determine out of pocket expense if they have not yet received this vaccine. Advised may also receive vaccine at local pharmacy or Health Dept. Verbalized acceptance and understanding.  Screening Tests Health Maintenance  Topic Date Due   FOOT EXAM  Never done   OPHTHALMOLOGY EXAM  Never done   Colonoscopy  01/30/2021   COVID-19 Vaccine (4 - 2023-24 season) 04/04/2022   INFLUENZA VACCINE  03/05/2023   HEMOGLOBIN A1C  07/03/2023   Diabetic kidney evaluation - eGFR measurement  12/31/2023   Diabetic kidney evaluation - Urine ACR  12/31/2023   Medicare Annual Wellness (AWV)  02/23/2024   DTaP/Tdap/Td (3 - Td or Tdap) 08/27/2031   Pneumonia  Vaccine 32+ Years old  Completed   HPV VACCINES  Aged Out   Zoster Vaccines- Shingrix  Discontinued    Health Maintenance  Health Maintenance Due  Topic Date Due   FOOT EXAM  Never done   OPHTHALMOLOGY EXAM  Never done   Colonoscopy  01/30/2021   COVID-19 Vaccine (4 - 2023-24 season) 04/04/2022    Colorectal cancer screening: Type of screening: Colonoscopy. Completed 01/31/20. Repeat every 1 years  Lung Cancer Screening: (Low Dose CT Chest recommended if Age 83-80 years, 20 pack-year currently smoking OR have quit w/in 15years.) does not qualify.   Lung Cancer Screening Referral: N/A  Additional Screening:  Hepatitis C Screening: does not qualify; Completed N/A  Vision Screening: Recommended annual ophthalmology  exams for early detection of glaucoma and other disorders of the eye. Is the patient up to date with their annual eye exam?  Yes  Who is the provider or what is the name of the office in which the patient attends annual eye exams? [facility] If pt is not established with a provider, would they like to be referred to a provider to establish care? No .   Dental Screening: Recommended annual dental exams for proper oral hygiene  Diabetic Foot Exam: Diabetic Foot Exam: Overdue, Pt has been advised about the importance in completing this exam. Pt is scheduled for diabetic foot exam on N/A.  Community Resource Referral / Chronic Care Management: CRR required this visit?  No   CCM required this visit?  No     Plan:     I have personally reviewed and noted the following in the patient's chart:   Medical and social history Use of alcohol, tobacco or illicit drugs  Current medications and supplements including opioid prescriptions. Patient is not currently taking opioid prescriptions. Functional ability and status Nutritional status Physical activity Advanced directives List of other physicians Hospitalizations, surgeries, and ER visits in previous 12  months Vitals Screenings to include cognitive, depression, and falls Referrals and appointments  In addition, I have reviewed and discussed with patient certain preventive protocols, quality metrics, and best practice recommendations. A written personalized care plan for preventive services as well as general preventive health recommendations were provided to patient.     Ferdie Ping, CMA   02/23/2023   After Visit Summary: (Declined) Due to this being a telephonic visit, with patients personalized plan was offered to patient but patient Declined AVS at this time   Nurse Notes:  Daniel Hampton , Thank you for taking time to come for your Medicare Wellness Visit. I appreciate your ongoing commitment to your health goals. Please review the following plan we discussed and let me know if I can assist you in the future.   These are the goals we discussed:  Goals       Client understands the importance of follow-up with providers by attending scheduled visits (pt-stated)      My goal is to continue to stay active by walking 3 miles (6,000 steps) every other day, playing golf ang get my cholesterol down.      Patient Stated (pt-stated)      He wants to keep his cholesterol down and stay healthy         This is a list of the screening recommended for you and due dates:  Health Maintenance  Topic Date Due   Complete foot exam   Never done   Eye exam for diabetics  Never done   Colon Cancer Screening  01/30/2021   COVID-19 Vaccine (4 - 2023-24 season) 04/04/2022   Flu Shot  03/05/2023   Hemoglobin A1C  07/03/2023   Yearly kidney function blood test for diabetes  12/31/2023   Yearly kidney health urinalysis for diabetes  12/31/2023   Medicare Annual Wellness Visit  02/23/2024   DTaP/Tdap/Td vaccine (3 - Td or Tdap) 08/27/2031   Pneumonia Vaccine  Completed   HPV Vaccine  Aged Out   Zoster (Shingles) Vaccine  Discontinued

## 2023-02-25 ENCOUNTER — Ambulatory Visit (AMBULATORY_SURGERY_CENTER): Payer: Medicare Other | Admitting: Internal Medicine

## 2023-02-25 ENCOUNTER — Encounter: Payer: Self-pay | Admitting: Internal Medicine

## 2023-02-25 VITALS — BP 120/67 | HR 51 | Temp 98.6°F | Resp 13 | Ht 70.0 in | Wt 229.0 lb

## 2023-02-25 DIAGNOSIS — D125 Benign neoplasm of sigmoid colon: Secondary | ICD-10-CM | POA: Diagnosis not present

## 2023-02-25 DIAGNOSIS — D122 Benign neoplasm of ascending colon: Secondary | ICD-10-CM

## 2023-02-25 DIAGNOSIS — Z09 Encounter for follow-up examination after completed treatment for conditions other than malignant neoplasm: Secondary | ICD-10-CM

## 2023-02-25 DIAGNOSIS — Z86010 Personal history of colonic polyps: Secondary | ICD-10-CM | POA: Diagnosis not present

## 2023-02-25 DIAGNOSIS — D123 Benign neoplasm of transverse colon: Secondary | ICD-10-CM | POA: Diagnosis not present

## 2023-02-25 DIAGNOSIS — K635 Polyp of colon: Secondary | ICD-10-CM | POA: Diagnosis not present

## 2023-02-25 MED ORDER — SODIUM CHLORIDE 0.9 % IV SOLN
500.0000 mL | Freq: Once | INTRAVENOUS | Status: DC
Start: 2023-02-25 — End: 2023-02-25

## 2023-02-25 NOTE — Patient Instructions (Addendum)
I removed 3 tiny polyps today. I will send them to the lab. No more routine colonoscopy at this age.  You still have diverticulosis - thickened muscle rings and pouches in the colon wall. Please read the handout about this condition.  I appreciate the opportunity to care for you. Iva Boop, MD, [facility]   Handouts on polyps,diverticulosis,& hemorrhoids given to you today.   Await pathology results on polyps removed     YOU HAD AN ENDOSCOPIC PROCEDURE TODAY AT THE [facility]:   Refer to the procedure report that was given to you for any specific questions about what was found during the examination.  If the procedure report does not answer your questions, please call your gastroenterologist to clarify.  If you requested that your care partner not be given the details of your procedure findings, then the procedure report has been included in a sealed envelope for you to review at your convenience later.  YOU SHOULD EXPECT: Some feelings of bloating in the abdomen. Passage of more gas than usual.  Walking can help get rid of the air that was put into your GI tract during the procedure and reduce the bloating. If you had a lower endoscopy (such as a colonoscopy or flexible sigmoidoscopy) you may notice spotting of blood in your stool or on the toilet paper. If you underwent a bowel prep for your procedure, you may not have a normal bowel movement for a few days.  Please Note:  You might notice some irritation and congestion in your nose or some drainage.  This is from the oxygen used during your procedure.  There is no need for concern and it should clear up in a day or so.  SYMPTOMS TO REPORT IMMEDIATELY:  Following lower endoscopy (colonoscopy or flexible sigmoidoscopy):  Excessive amounts of blood in the stool  Significant tenderness or worsening of abdominal pains  Swelling of the abdomen that is new, acute  Fever of 100F or higher   For urgent or emergent issues, a  gastroenterologist can be reached at any hour by calling [phone]. Do not use MyChart messaging for urgent concerns.    DIET:  We do recommend a small meal at first, but then you may proceed to your regular diet.  Drink plenty of fluids but you should avoid alcoholic beverages for 24 hours.  ACTIVITY:  You should plan to take it easy for the rest of today and you should NOT DRIVE or use heavy machinery until tomorrow (because of the sedation medicines used during the test).    FOLLOW UP: Our staff will call the number listed on your records the next business day following your procedure.  We will call around 7:15- 8:00 am to check on you and address any questions or concerns that you may have regarding the information given to you following your procedure. If we do not reach you, we will leave a message.     If any biopsies were taken you will be contacted by phone or by letter within the next 1-3 weeks.  Please call us at [phone] if you have not heard about the biopsies in 3 weeks.    SIGNATURES/CONFIDENTIALITY: You and/or your care partner have signed paperwork which will be entered into your electronic medical record.  These signatures attest to the fact that that the information above on your After Visit Summary has been reviewed and is understood.  Full responsibility of the confidentiality of this discharge information lies  with you and/or your care-partner.

## 2023-02-25 NOTE — Progress Notes (Signed)
Pt's states no medical or surgical changes since previsit or office visit. 

## 2023-02-25 NOTE — Progress Notes (Signed)
[facility] History and Physical   Primary Care Physician:  Pincus Sanes, MD   Reason for Procedure:   Hx colon polyps  Plan:    colonoscopy     HPI: Daniel Hampton is a 82 y.o. male w/ prior colon polyps   2010 - diminutive serrated adenoma/polyp - had adenomas before this one 12/2013 - 2 diminutive adenomas - 01/2020 10 adenomas, diminutive-small -   Past Surgical History:  Procedure Laterality Date   BACK SURGERY     COLONOSCOPY W/ BIOPSIES     CT CTA CORONARY W/CA SCORE W/CM &/OR WO/CM  01/27/2022   1. Coronary artery calcium score 1540 Agatston units. This places the patient in the 76th percentile for age and gender, suggesting intermediate risk for future events.  2. Extensive coronary disease but appears nonobstructive. This is confirmed by CT FFR. 3. 4.2 cm dilation of the ascending aorta,   EYE SURGERY Left    LUMBAR LAMINECTOMY/ DECOMPRESSION WITH MET-RX Left 03/01/2021   Procedure: LEFT LUMBAR FOUR-FIVE MINIMALLY INVASIVE LAMINECTOMY, LATERAL RECESS DECOMPRESSION AND RESECTION OF SYNOVIAL CYST;  Surgeon: Bethann Goo, DO;  Location: [facility] OR;  Service: Neurosurgery;  Laterality: Left;   MOHS SURGERY     SPINE SURGERY     lumbar disk surgery    Prior to Admission medications   Medication Sig Start Date End Date Taking? Authorizing Provider  Coenzyme Q10 (COQ-10 PO) Take 1 tablet by mouth daily.   Yes [provider]  famotidine (PEPCID) 20 MG tablet Take 20 mg by mouth 2 (two) times daily. 07/02/21  Yes [provider]  Multiple Vitamins-Minerals (ALIVE MULTI-VITAMIN PO) Take 1 tablet by mouth daily.   Yes [provider]  valACYclovir (VALTREX) 1000 MG tablet Take by mouth. 12/23/22  Yes [provider]  aspirin EC 81 MG tablet Take 81 mg by mouth daily. Swallow whole.    [provider]  cetirizine (ZYRTEC) 10 MG tablet Take 1 tablet (10 mg total) by mouth daily. Patient taking differently: Take 10 mg by  mouth daily as needed. 08/13/21   Burns, Bobette Mo, MD  Evolocumab (REPATHA SURECLICK) 140 MG/ML SOAJ Inject 1 mL into the skin every 14 (fourteen) days. 04/11/22   Marykay Lex, MD  fluticasone [facility]) 50 MCG/ACT nasal spray Place 2 sprays into both nostrils daily as needed for allergies or rhinitis.    [provider]  rosuvastatin (CRESTOR) 5 MG tablet Take 1 tablet (5 mg total) by mouth 3 (three) times a week. 01/05/23   Pincus Sanes, MD  tamsulosin (FLOMAX) 0.4 MG CAPS capsule Take 0.4 mg by mouth as needed. 1-2 times per week. 03/21/21   [provider]    Current Outpatient Medications  Medication Sig Dispense Refill   Coenzyme Q10 (COQ-10 PO) Take 1 tablet by mouth daily.     famotidine (PEPCID) 20 MG tablet Take 20 mg by mouth 2 (two) times daily.     Multiple Vitamins-Minerals (ALIVE MULTI-VITAMIN PO) Take 1 tablet by mouth daily.     valACYclovir (VALTREX) 1000 MG tablet Take by mouth.     aspirin EC 81 MG tablet Take 81 mg by mouth daily. Swallow whole.     cetirizine (ZYRTEC) 10 MG tablet Take 1 tablet (10 mg total) by mouth daily. (Patient taking differently: Take 10 mg by mouth daily as needed.) 30 tablet 11   Evolocumab (REPATHA SURECLICK) 140 MG/ML SOAJ Inject 1 mL into the skin every 14 (fourteen) days. 6  mL 3   fluticasone (FLONASE) 50 MCG/ACT nasal spray Place 2 sprays into both nostrils daily as needed for allergies or rhinitis.     rosuvastatin (CRESTOR) 5 MG tablet Take 1 tablet (5 mg total) by mouth 3 (three) times a week. 13 tablet 3   tamsulosin (FLOMAX) 0.4 MG CAPS capsule Take 0.4 mg by mouth as needed. 1-2 times per week.     Current Facility-Administered Medications  Medication Dose Route Frequency Provider Last Rate Last Admin   0.9 %  sodium chloride infusion  500 mL Intravenous Once Iva Boop, MD        Allergies as of 02/25/2023 - Review Complete 02/25/2023  Allergen Reaction Noted   Atorvastatin Other (See Comments) 04/10/2022    Crestor [rosuvastatin] Other (See Comments) 04/10/2022   Imuran [azathioprine] Other (See Comments) 06/02/2022   Tape Rash 09/24/2021    Family History  Problem Relation Age of Onset   Lung cancer Mother    Ovarian cancer Mother    COPD Father    Heart attack Father    Alcohol abuse Brother    Lung cancer Brother        smoker   Breast cancer Daughter    Breast cancer Daughter    Colon cancer Neg Hx    Esophageal cancer Neg Hx    Rectal cancer Neg Hx    Stomach cancer Neg Hx     Social History   Socioeconomic History   Marital status: Married    Spouse name: Not on file   Number of children: 3   Years of education: Not on file   Highest education level: Not on file  Occupational History   Occupation: Retired  Tobacco Use   Smoking status: Former    Current packs/day: 0.00    Average packs/day: 1 pack/day for 20.0 years (20.0 ttl pk-yrs)    Types: Cigarettes    Start date: 10/30/1958    Quit date: 10/30/1978    Years since quitting: 44.3   Smokeless tobacco: Never  Vaping Use   Vaping status: Never Used  Substance and Sexual Activity   Alcohol use: Yes    Comment: 1 burben and 1 beer    Drug use: No   Sexual activity: Not on file  Other Topics Concern   Not on file  Social History Narrative   Retired from [employer] at [employer]   Enjoys golfing   Three daughters   Married     Right Handed    Has 2 granddaughters 1 grandson    Social Determinants of Health   Financial Resource Strain: Low Risk  (02/23/2023)   Overall Financial Resource Strain (CARDIA)    Difficulty of Paying Living Expenses: Not hard at all  Food Insecurity: No Food Insecurity (02/23/2023)   Hunger Vital Sign    Worried About Running Out of Food in the Last Year: Never true    Ran Out of Food in the Last Year: Never true  Transportation Needs: No Transportation Needs (02/23/2023)   PRAPARE - Administrator, Civil Service (Medical): No    Lack of Transportation  (Non-Medical): No  Physical Activity: Sufficiently Active (02/23/2023)   Exercise Vital Sign    Days of Exercise per Week: 5 days    Minutes of Exercise per Session: 60 min  Stress: No Stress Concern Present (02/23/2023)   [employer] of Occupational Health - Occupational Stress Questionnaire    Feeling of Stress : Not  at all  Social Connections: Socially Integrated (02/23/2023)   Social Connection and Isolation Panel [NHANES]    Frequency of Communication with Friends and Family: Three times a week    Frequency of Social Gatherings with Friends and Family: Three times a week    Attends Religious Services: 1 to 4 times per year    Active Member of Clubs or Organizations: Yes    Attends Banker Meetings: 1 to 4 times per year    Marital Status: Married  Catering manager Violence: Not At Risk (02/23/2023)   Humiliation, Afraid, Rape, and Kick questionnaire    Fear of Current or Ex-Partner: No    Emotionally Abused: No    Physically Abused: No    Sexually Abused: No    Review of Systems:  All other review of systems negative except as mentioned in the HPI.  Physical Exam: Vital signs BP 110/63   Pulse (!) 52   Temp 98.6 F (37 C)   Ht 5\' 10"  (1.778 m)   Wt 229 lb (103.9 kg)   SpO2 96%   BMI 32.86 kg/m   General:   Alert,  Well-developed, well-nourished, pleasant and cooperative in NAD Lungs:  Clear throughout to auscultation.   Heart:  Regular rate and rhythm; no murmurs, clicks, rubs,  or gallops. Abdomen:  Soft, nontender and nondistended. Normal bowel sounds.   Neuro/Psych:  Alert and cooperative. Normal mood and affect. A and O x 3   @Daniel Cancio  Sena Slate, MD, [facility] [phone] (pager) 02/25/2023 11:37 AM@

## 2023-02-25 NOTE — Progress Notes (Signed)
Called to room to assist during endoscopic procedure.  Patient ID and intended procedure confirmed with present staff. Received instructions for my participation in the procedure from the performing physician.  

## 2023-02-25 NOTE — Progress Notes (Signed)
Report given to PACU, vss 

## 2023-02-25 NOTE — Op Note (Signed)
[facility] Patient Name: Daniel Hampton Procedure Date: 02/25/2023 11:40 AM MRN: [id number] Endoscopist: Iva Boop , MD, [id number] Age: 82 Referring MD:  Date of Birth: 09-Mar-1941 Gender: Male Account #: [id number] Procedure:                Colonoscopy Indications:              Surveillance: Personal history of adenomatous                            polyps on last colonoscopy > 3 years ago, Last                            colonoscopy: June 2021 Medicines:                Monitored Anesthesia Care Procedure:                Pre-Anesthesia Assessment:                           - Prior to the procedure, a History and Physical                            was performed, and patient medications and                            allergies were reviewed. The patient's tolerance of                            previous anesthesia was also reviewed. The risks                            and benefits of the procedure and the sedation                            options and risks were discussed with the patient.                            All questions were answered, and informed consent                            was obtained. Prior Anticoagulants: The patient has                            taken no anticoagulant or antiplatelet agents. ASA                            Grade Assessment: II - A patient with mild systemic                            disease. After reviewing the risks and benefits,                            the patient was deemed in satisfactory condition to  undergo the procedure.                           After obtaining informed consent, the colonoscope                            was passed under direct vision. Throughout the                            procedure, the patient's blood pressure, pulse, and                            oxygen saturations were monitored continuously. The                            CF [id number] #[id number] was introduced through the  anus                            and advanced to the the cecum, identified by                            appendiceal orifice and ileocecal valve. The                            colonoscopy was performed without difficulty. The                            patient tolerated the procedure well. The quality                            of the bowel preparation was good. The ileocecal                            valve, appendiceal orifice, and rectum were                            photographed. Scope In: 11:48:16 AM Scope Out: 12:08:07 PM Scope Withdrawal Time: 0 hours 15 minutes 8 seconds  Total Procedure Duration: 0 hours 19 minutes 51 seconds  Findings:                 The perianal and digital rectal examinations were                            normal.                           Three sessile polyps were found in the sigmoid                            colon, transverse colon and ascending colon. The                            polyps were diminutive in size. These polyps were  removed with a cold snare. Resection and retrieval                            were complete. Verification of patient                            identification for the specimen was done. Estimated                            blood loss was minimal.                           Multiple diverticula were found in the sigmoid                            colon, descending colon, transverse colon and                            ascending colon.                           Internal hemorrhoids were found.                           The exam was otherwise without abnormality on                            direct and retroflexion views. Complications:            No immediate complications. Estimated Blood Loss:     Estimated blood loss was minimal. Impression:               - Three diminutive polyps in the sigmoid colon, in                            the transverse colon and in the ascending colon,                             removed with a cold snare. Resected and retrieved.                           - Severe diverticulosis in the sigmoid colon, in                            the descending colon, in the transverse colon and                            in the ascending colon.                           - Internal hemorrhoids.                           - The examination was otherwise normal on direct  and retroflexion views.                           - Personal history of colonic polyps.                           2010 - diminutive serrated adenoma/polyp - had                            adenomas before this one                           12/2013 - 2 diminutive adenomas -                           01/2020 10 adenomas, diminutive-small - Recommendation:           - Patient has a contact number available for                            emergencies. The signs and symptoms of potential                            delayed complications were discussed with the                            patient. Return to normal activities tomorrow.                            Written discharge instructions were provided to the                            patient.                           - Resume previous diet.                           - Continue present medications.                           - Await pathology results.                           - No repeat colonoscopy due to age. Iva Boop, MD 02/25/2023 12:14:12 PM This report has been signed electronically.

## 2023-02-26 ENCOUNTER — Telehealth: Payer: Self-pay | Admitting: *Deleted

## 2023-02-26 NOTE — Telephone Encounter (Signed)
  Follow up Call-     02/25/2023   10:55 AM  Call back number  Post procedure Call Back phone  # [phone]  Permission to leave phone message Yes     Patient questions:  Do you have a fever, pain , or abdominal swelling? No. Pain Score  0 *  Have you tolerated food without any problems? Yes.    Have you been able to return to your normal activities? Yes.    Do you have any questions about your discharge instructions: Diet   No. Medications  No. Follow up visit  No.  Do you have questions or concerns about your Care? No.  Actions: * If pain score is 4 or above: No action needed, pain <4.

## 2023-03-05 DIAGNOSIS — R972 Elevated prostate specific antigen [PSA]: Secondary | ICD-10-CM | POA: Diagnosis not present

## 2023-03-09 ENCOUNTER — Encounter: Payer: Self-pay | Admitting: Internal Medicine

## 2023-03-10 DIAGNOSIS — Z79899 Other long term (current) drug therapy: Secondary | ICD-10-CM | POA: Diagnosis not present

## 2023-03-10 DIAGNOSIS — H3581 Retinal edema: Secondary | ICD-10-CM | POA: Diagnosis not present

## 2023-03-10 DIAGNOSIS — Z961 Presence of intraocular lens: Secondary | ICD-10-CM | POA: Diagnosis not present

## 2023-03-10 DIAGNOSIS — H21541 Posterior synechiae (iris), right eye: Secondary | ICD-10-CM | POA: Diagnosis not present

## 2023-03-10 DIAGNOSIS — H30033 Focal chorioretinal inflammation, peripheral, bilateral: Secondary | ICD-10-CM | POA: Diagnosis not present

## 2023-03-12 DIAGNOSIS — R350 Frequency of micturition: Secondary | ICD-10-CM | POA: Diagnosis not present

## 2023-03-12 DIAGNOSIS — N401 Enlarged prostate with lower urinary tract symptoms: Secondary | ICD-10-CM | POA: Diagnosis not present

## 2023-03-12 DIAGNOSIS — R972 Elevated prostate specific antigen [PSA]: Secondary | ICD-10-CM | POA: Diagnosis not present

## 2023-03-19 ENCOUNTER — Other Ambulatory Visit: Payer: Self-pay | Admitting: Cardiology

## 2023-03-19 DIAGNOSIS — I2510 Atherosclerotic heart disease of native coronary artery without angina pectoris: Secondary | ICD-10-CM

## 2023-03-19 DIAGNOSIS — E785 Hyperlipidemia, unspecified: Secondary | ICD-10-CM

## 2023-03-31 ENCOUNTER — Telehealth: Payer: Self-pay

## 2023-03-31 ENCOUNTER — Other Ambulatory Visit (HOSPITAL_COMMUNITY): Payer: Self-pay

## 2023-03-31 NOTE — Telephone Encounter (Signed)
Pharmacy Patient Advocate Encounter   Received notification from CoverMyMeds that prior authorization for REPATHA is required/requested.   Insurance verification completed.   The patient is insured through [facility]  .   Per test claim: PA required; PA submitted to [employer] [place] [employer] via CoverMyMeds Key/confirmation #/EOC [id number] Status is pending

## 2023-04-01 NOTE — Telephone Encounter (Signed)
Received phone call from [employer] that PA has been approved through 03/30/24.

## 2023-04-07 ENCOUNTER — Emergency Department (HOSPITAL_COMMUNITY)
Admission: EM | Admit: 2023-04-07 | Discharge: 2023-04-07 | Disposition: A | Discharge status: Home / Self Care | Payer: Medicare Other | Attending: Emergency Medicine | Admitting: Emergency Medicine

## 2023-04-07 ENCOUNTER — Emergency Department (HOSPITAL_COMMUNITY): Payer: Medicare Other

## 2023-04-07 ENCOUNTER — Encounter: Payer: Self-pay | Admitting: Emergency Medicine

## 2023-04-07 ENCOUNTER — Other Ambulatory Visit: Payer: Self-pay

## 2023-04-07 ENCOUNTER — Ambulatory Visit (INDEPENDENT_AMBULATORY_CARE_PROVIDER_SITE_OTHER): Payer: Medicare Other | Admitting: Emergency Medicine

## 2023-04-07 VITALS — BP 132/72 | HR 55 | Temp 98.0°F | Resp 16 | Ht 70.0 in | Wt 228.0 lb

## 2023-04-07 DIAGNOSIS — Z8673 Personal history of transient ischemic attack (TIA), and cerebral infarction without residual deficits: Secondary | ICD-10-CM | POA: Insufficient documentation

## 2023-04-07 DIAGNOSIS — I700 Atherosclerosis of aorta: Secondary | ICD-10-CM | POA: Diagnosis not present

## 2023-04-07 DIAGNOSIS — I491 Atrial premature depolarization: Secondary | ICD-10-CM | POA: Insufficient documentation

## 2023-04-07 DIAGNOSIS — G459 Transient cerebral ischemic attack, unspecified: Secondary | ICD-10-CM

## 2023-04-07 DIAGNOSIS — I6782 Cerebral ischemia: Secondary | ICD-10-CM | POA: Insufficient documentation

## 2023-04-07 DIAGNOSIS — R42 Dizziness and giddiness: Secondary | ICD-10-CM | POA: Insufficient documentation

## 2023-04-07 DIAGNOSIS — I7090 Unspecified atherosclerosis: Secondary | ICD-10-CM | POA: Insufficient documentation

## 2023-04-07 LAB — URINALYSIS, ROUTINE W REFLEX MICROSCOPIC
Bilirubin Urine: NEGATIVE
Glucose, UA: NEGATIVE mg/dL
Hgb urine dipstick: NEGATIVE
Ketones, ur: NEGATIVE mg/dL
Leukocytes,Ua: NEGATIVE
Nitrite: NEGATIVE
Protein, ur: NEGATIVE mg/dL
Specific Gravity, Urine: 1.01 (ref 1.005–1.030)
pH: 5 (ref 5.0–8.0)

## 2023-04-07 LAB — CBC
HCT: 47.1 % (ref 39.0–52.0)
Hemoglobin: 15.9 g/dL (ref 13.0–17.0)
MCH: 33.4 pg (ref 26.0–34.0)
MCHC: 33.8 g/dL (ref 30.0–36.0)
MCV: 98.9 fL (ref 80.0–100.0)
Platelets: 230 10*3/uL (ref 150–400)
RBC: 4.76 MIL/uL (ref 4.22–5.81)
RDW: 12.1 % (ref 11.5–15.5)
WBC: 9 10*3/uL (ref 4.0–10.5)
nRBC: 0 % (ref 0.0–0.2)

## 2023-04-07 LAB — BASIC METABOLIC PANEL
Anion gap: 9 (ref 5–15)
BUN: 17 mg/dL (ref 8–23)
CO2: 26 mmol/L (ref 22–32)
Calcium: 9 mg/dL (ref 8.9–10.3)
Chloride: 104 mmol/L (ref 98–111)
Creatinine, Ser: 0.96 mg/dL (ref 0.61–1.24)
GFR, Estimated: >60 mL/min (ref >60)
Glucose, Bld: 97 mg/dL (ref 70–99)
Potassium: 4.3 mmol/L (ref 3.5–5.1)
Sodium: 139 mmol/L (ref 135–145)

## 2023-04-07 LAB — CBG MONITORING, ED: Glucose-Capillary: 104 mg/dL — ABNORMAL HIGH (ref 70–99)

## 2023-04-07 MED ORDER — MECLIZINE HCL 25 MG PO TABS: 25.0000 mg | ORAL_TABLET | Freq: Three times a day (TID) | ORAL | 0 refills | Status: DC | PRN

## 2023-04-07 MED ORDER — MECLIZINE HCL 25 MG PO TABS
50.0000 mg | ORAL_TABLET | Freq: Once | ORAL | Status: AC
  Administered 2023-04-07: 50 mg via ORAL
  Filled 2023-04-07: qty 2

## 2023-04-07 MED ORDER — LORAZEPAM 2 MG/ML IJ SOLN
0.5000 mg | Freq: Once | INTRAMUSCULAR | Status: AC
  Administered 2023-04-07: 0.5 mg via INTRAVENOUS
  Filled 2023-04-07: qty 1

## 2023-04-07 NOTE — Progress Notes (Signed)
Daniel Hampton 82 y.o.   Chief Complaint  Patient presents with   Dizziness    Started this am at 5 am     HISTORY OF PRESENT ILLNESS: This is a 82 y.o. male complaining of dizziness that started acutely today 5 in the morning Feels different than prior episodes.  Balance is off. Denies headache, nausea or vomiting.  Denies syncopal episode.  Dizziness Associated symptoms include myalgias. Pertinent negatives include no abdominal pain, chest pain, chills, congestion, coughing, fever, nausea, rash, sore throat or vomiting.     Prior to Admission medications   Medication Sig Start Date End Date Taking? Authorizing Provider  cetirizine (ZYRTEC) 10 MG tablet Take 1 tablet (10 mg total) by mouth daily. Patient taking differently: Take 10 mg by mouth daily as needed. 08/13/21  Yes Burns, Bobette Mo, MD  Coenzyme Q10 (COQ-10 PO) Take 1 tablet by mouth daily.   Yes [provider]  famotidine (PEPCID) 20 MG tablet Take 20 mg by mouth 2 (two) times daily. 07/02/21  Yes [provider]  Multiple Vitamins-Minerals (ALIVE MULTI-VITAMIN PO) Take 1 tablet by mouth daily.   Yes [provider]  REPATHA SURECLICK 140 MG/ML SOAJ inject 1ml into the skin every 14 days 03/19/23  Yes Marykay Lex, MD  rosuvastatin (CRESTOR) 5 MG tablet Take 1 tablet (5 mg total) by mouth 3 (three) times a week. 01/05/23  Yes Burns, Bobette Mo, MD  valACYclovir (VALTREX) 1000 MG tablet Take by mouth. 12/23/22  Yes [provider]    Allergies  Allergen Reactions   Atorvastatin Other (See Comments)    myalgia   Crestor [Rosuvastatin] Other (See Comments)    Too high of a dose makes his muscles ache   Imuran [Azathioprine] Other (See Comments)    pancreatitis   Tape Rash    Patient Active Problem List   Diagnosis Date Noted   Statin myopathy 12/31/2022   Pancreatitis 05/12/2022   Anterior uveitis 05/12/2022   Hepatitis A antibody positive 05/12/2022   Allergic rhinitis due to  pollen 04/10/2022   Chronic allergic conjunctivitis 04/10/2022   Gastro-esophageal reflux disease without esophagitis 04/10/2022   Coronary artery disease, non-occlusive 04/01/2022   Cough present for greater than 3 weeks 01/29/2022   Sensation of fullness in left ear 01/29/2022   Exercise intolerance 01/06/2022   Elevated coronary artery calcium score 12/26/2021   Corneal ulcer of right eye 09/23/2021   Diabetes (HCC) 08/12/2021   Perioral dermatitis 07/08/2021   Eyelid dermatitis, allergic/contact 07/08/2021   Dermatitis of ear canal, bilateral 07/08/2021   Emphysema lung (HCC) 07/02/2021   Lung nodule seen on imaging study 07/02/2021   Chronic cough 07/02/2021   Lumbar radiculopathy L5 s/p L4-5 laminectomy 7/22 02/06/2021   Allergic rhinitis 01/18/2020   Aortic atherosclerosis (HCC) 01/18/2020   Biceps tendinitis of left shoulder 02/22/2019   Medial epicondylitis of elbow, right 05/17/2018   Lateral epicondylitis of right elbow 05/17/2018   DIVERTICULOSIS, COLON 10/17/2009   H/O melanoma in situ 10/17/2009   DISC DISEASE, LUMBAR 12/03/2008   Morton's neuroma of both feet 03/13/2008   VERTIGO, BENIGN PAROXYSMAL POSITION 04/28/2007   Hyperlipidemia with target LDL less than 70 03/12/2007   COLONIC POLYPS, HX OF 03/12/2007    Past Medical History:  Diagnosis Date   Allergy    SEASONAL   Arthritis    Asthma    COLONIC POLYPS, HX OF 03/12/2007   DISC DISEASE, LUMBAR 12/03/2008   DIVERTICULOSIS, COLON 10/17/2009   History  of kidney stones    Hydrocele    HYPERLIPIDEMIA 03/12/2007   Hyphema of right eye    Complicated by allergic conjunctivitis   Melanoma (HCC) 04/23/2020   in situ-Left Upper Arm   MORTON'S NEUROMA, RIGHT 03/13/2008   SCC (squamous cell carcinoma) 04/26/2013   right hand-txpbx   SKIN CANCER, HX OF 10/17/2009   VERTIGO, BENIGN PAROXYSMAL POSITION 04/28/2007    Past Surgical History:  Procedure Laterality Date   BACK SURGERY     COLONOSCOPY W/  BIOPSIES     CT CTA CORONARY W/CA SCORE W/CM &/OR WO/CM  01/27/2022   1. Coronary artery calcium score 1540 Agatston units. This places the patient in the 76th percentile for age and gender, suggesting intermediate risk for future events.  2. Extensive coronary disease but appears nonobstructive. This is confirmed by CT FFR. 3. 4.2 cm dilation of the ascending aorta,   EYE SURGERY Left    LUMBAR LAMINECTOMY/ DECOMPRESSION WITH MET-RX Left 03/01/2021   Procedure: LEFT LUMBAR FOUR-FIVE MINIMALLY INVASIVE LAMINECTOMY, LATERAL RECESS DECOMPRESSION AND RESECTION OF SYNOVIAL CYST;  Surgeon: Bethann Goo, DO;  Location: [facility] OR;  Service: Neurosurgery;  Laterality: Left;   MOHS SURGERY     SPINE SURGERY     lumbar disk surgery    Social History   Socioeconomic History   Marital status: Married    Spouse name: Not on file   Number of children: 3   Years of education: Not on file   Highest education level: Not on file  Occupational History   Occupation: Retired  Tobacco Use   Smoking status: Former    Current packs/day: 0.00    Average packs/day: 1 pack/day for 20.0 years (20.0 ttl pk-yrs)    Types: Cigarettes    Start date: 10/30/1958    Quit date: 10/30/1978    Years since quitting: 44.4   Smokeless tobacco: Never  Vaping Use   Vaping status: Never Used  Substance and Sexual Activity   Alcohol use: Yes    Comment: 1 burben and 1 beer    Drug use: No   Sexual activity: Not on file  Other Topics Concern   Not on file  Social History Narrative   Retired from [employer] at [employer]   Enjoys golfing   Three daughters   Married     Right Handed    Has 2 granddaughters 1 grandson    Social Determinants of Health   Financial Resource Strain: Low Risk  (02/23/2023)   Overall Financial Resource Strain (CARDIA)    Difficulty of Paying Living Expenses: Not hard at all  Food Insecurity: No Food Insecurity (02/23/2023)   Hunger Vital Sign    Worried About Running Out of Food  in the Last Year: Never true    Ran Out of Food in the Last Year: Never true  Transportation Needs: No Transportation Needs (02/23/2023)   PRAPARE - Administrator, Civil Service (Medical): No    Lack of Transportation (Non-Medical): No  Physical Activity: Sufficiently Active (02/23/2023)   Exercise Vital Sign    Days of Exercise per Week: 5 days    Minutes of Exercise per Session: 60 min  Stress: No Stress Concern Present (02/23/2023)   [employer] of Occupational Health - Occupational Stress Questionnaire    Feeling of Stress : Not at all  Social Connections: Socially Integrated (02/23/2023)   Social Connection and Isolation Panel [NHANES]    Frequency of Communication with Friends  and Family: Three times a week    Frequency of Social Gatherings with Friends and Family: Three times a week    Attends Religious Services: 1 to 4 times per year    Active Member of Clubs or Organizations: Yes    Attends Banker Meetings: 1 to 4 times per year    Marital Status: Married  Catering manager Violence: Not At Risk (02/23/2023)   Humiliation, Afraid, Rape, and Kick questionnaire    Fear of Current or Ex-Partner: No    Emotionally Abused: No    Physically Abused: No    Sexually Abused: No    Family History  Problem Relation Age of Onset   Lung cancer Mother    Ovarian cancer Mother    COPD Father    Heart attack Father    Alcohol abuse Brother    Lung cancer Brother        smoker   Breast cancer Daughter    Breast cancer Daughter    Colon cancer Neg Hx    Esophageal cancer Neg Hx    Rectal cancer Neg Hx    Stomach cancer Neg Hx      Review of Systems  Constitutional: Negative.  Negative for chills and fever.  HENT: Negative.  Negative for congestion and sore throat.   Eyes:        Very poor vision from right eye secondary to old injury  Respiratory: Negative.  Negative for cough and shortness of breath.   Cardiovascular: Negative.  Negative for chest  pain and palpitations.  Gastrointestinal:  Negative for abdominal pain, diarrhea, nausea and vomiting.  Genitourinary: Negative.  Negative for dysuria and hematuria.  Musculoskeletal:  Positive for myalgias.  Skin: Negative.  Negative for rash.  Neurological:  Positive for dizziness.  All other systems reviewed and are negative.   Today's Vitals   04/07/23 1301  BP: 132/72  Pulse: (!) 55  Resp: 16  Temp: 98 F (36.7 C)  SpO2: 98%  Weight: 228 lb (103.4 kg)  Height: 5\' 10"  (1.778 m)   Body mass index is 32.71 kg/m.   Physical Exam Vitals reviewed.  Constitutional:      Appearance: Normal appearance.  HENT:     Head: Normocephalic.     Mouth/Throat:     Mouth: Mucous membranes are moist.     Pharynx: Oropharynx is clear.  Eyes:     Comments: Right eye chronic pathology  Cardiovascular:     Rate and Rhythm: Normal rate and regular rhythm.     Pulses: Normal pulses.     Heart sounds: Normal heart sounds.  Pulmonary:     Effort: Pulmonary effort is normal.     Breath sounds: Normal breath sounds.  Musculoskeletal:     Cervical back: No tenderness.  Lymphadenopathy:     Cervical: No cervical adenopathy.  Skin:    General: Skin is warm and dry.     Capillary Refill: Capillary refill takes less than 2 seconds.  Neurological:     General: No focal deficit present.     Mental Status: He is alert and oriented to person, place, and time.  Psychiatric:        Mood and Affect: Mood normal.        Behavior: Behavior normal.      ASSESSMENT & PLAN: A total of 44 minutes was spent with the patient and counseling/coordination of care regarding preparing for this visit, review of most recent office visit note, review of  multiple chronic medical conditions under management, review of all medications, differential diagnosis of dizziness and need for workup and evaluation for possible TIA, need to go to emergency department for evaluation now, prognosis, documentation, and need  for follow-up.  Problem List Items Addressed This Visit       Cardiovascular and Mediastinum   Aortic atherosclerosis (HCC) (Chronic)   TIA (transient ischemic attack) - Primary    Multiple risk factors.  Patient with atherosclerotic vascular disease Sudden onset of dizziness feeling different than previous episodes Unsteady gait needing use of cane he does not normally use. No other neurological finding.  Low threshold for suspicion of TIA Needs workup and imaging Recommend emergency department evaluation today.      Atherosclerotic vascular disease    On rosuvastatin and Repatha        Other   Dizziness    Clinically stable.  Differential diagnosis discussed. Cannot rule out central etiology. Needs neuroimaging and emergency department evaluation.      Patient Instructions  Go to emergency department now for further evaluation.  Transient Ischemic Attack A transient ischemic attack (TIA) causes the same symptoms as a stroke, but the symptoms go away quickly. A TIA happens when blood flow to the brain is blocked. Having a TIA means you may be at risk for a stroke. A TIA is a medical emergency. What are the causes? A TIA is caused by a blocked artery in the head or neck. This means the brain does not get the blood supply it needs. A blockage can be caused by: Fatty buildup in an artery in the head or neck. A blood clot. A tear in an artery. Irritation and swelling (inflammation) of an artery. Sometimes the cause is not known. What increases the risk? Certain things may make you more likely to have a TIA. Some of these are things that you can change, such as: Using products that have nicotine or tobacco. Not being active. Drinking too much alcohol. Using recreational drugs. Health conditions that may increase your risk include: High blood pressure. High cholesterol. Diabetes. Heart disease. A heartbeat that is not regular (atrial fibrillation). Sickle cell  disease. Problems with blood clotting. Other risk factors include: Being over the age of 20. Being male. Being very overweight. Sleep problems (sleep apnea). Having a family history of stroke. Having had blood clots, stroke, TIA, or heart attack in the past. What are the signs or symptoms? The symptoms of a TIA are like those of a stroke. They can include: Weakness or loss of feeling in your face, arm, or leg. This often happens on one side of your body. Trouble walking. Trouble moving your arms or legs. Trouble talking or understanding what people are saying. Problems with how you see. Feeling dizzy. Feeling confused. Loss of balance or coordination. Feeling like you may vomit (nausea) or vomiting. Having a very bad headache. If you can, note what time you started to have symptoms. Tell your doctor. How is this treated? The goal of treatment is to lower the risk for a stroke. This may include: Changes to diet and lifestyle, such as getting regular exercise and stopping smoking. Taking medicines to: Thin the blood. Lower blood pressure. Lower cholesterol. Treating other health conditions, such as diabetes. If testing shows that an artery in your brain is narrow, your doctor may recommend a procedure to: Take the blockage out of your artery. Open or widen an artery in your neck (carotid angioplasty and stenting). Follow these instructions at  home: Medicines Take over-the-counter and prescription medicines only as told by your doctor. If you were told to take aspirin or another medicine to thin your blood, use it exactly as told by your doctor. Taking too much of the medicine can cause bleeding. Taking too little of the medicine may not work to treat the problem. Eating and drinking  Eat 5 or more servings of fruits and vegetables each day. Follow instructions from your doctor about your diet. You may need to follow a certain diet to help lower your risk of a stroke. You may  need to: Eat a diet that is low in fat and salt. Eat foods with a lot of fiber. Limit carbohydrates and sugar. If you drink alcohol: Limit how much you have to: 0-1 drink a day for women who are not pregnant. 0-2 drinks a day for men. Know how much alcohol is in a drink. In the [place] one drink equals one 12 oz bottle of beer (355 mL), one 5 oz glass of wine (148 mL), or one 1 oz glass of hard liquor (44 mL). General instructions Keep a healthy weight. Try to get at least 30 minutes of exercise on most days. Get treatment if you have sleep problems. Do not smoke or use any products that contain nicotine or tobacco. If you need help quitting, ask your doctor. Do not use drugs. Keep all follow-up visits. Your doctor will want to know if you have any more symptoms and to check blood labs if any medicines were prescribed. Where to find more information American Stroke Association: stroke.org Get help right away if: You have chest pain. You have a heartbeat that is not regular. You have any signs of a stroke. "BE FAST" is an easy way to remember the main warning signs: B - Balance. Dizziness, sudden trouble walking, or loss of balance. E - Eyes. Trouble seeing or a change in how you see. F - Face. Sudden weakness or loss of feeling of the face. The face or eyelid may droop on one side. A - Arms. Weakness or loss of feeling in an arm. This happens all of a sudden and most often on one side of the body. S - Speech. Sudden trouble speaking, slurred speech, or trouble understanding what people say. T - Time. Time to call emergency services. Write down what time symptoms started. You have other signs of a stroke, such as: A sudden, very bad headache with no known cause. Feeling like you may vomit. Vomiting. A seizure. These symptoms may be an emergency. Get help right away. Call 911. Do not wait to see if the symptoms will go away. Do not drive yourself to the hospital. This information is  not intended to replace advice given to you by your health care provider. Make sure you discuss any questions you have with your health care provider. Document Revised: 01/03/2022 Document Reviewed: 01/03/2022 Elsevier Patient Education  2024 Elsevier Inc.     Edwina Barth, MD [facility]

## 2023-04-07 NOTE — Assessment & Plan Note (Signed)
On rosuvastatin and Repatha

## 2023-04-07 NOTE — Discharge Instructions (Addendum)
Dear Mr Henshaw, It was a pleasure taking care of you at [facility] ED. We are discharging you home now that you are doing better and you MRI head did not show any evidence of stroke. please follow the following instructions.  1) I am sending you home with 30-day prescription of meclizine please take 1 tab times daily as needed 2)Pls dont hesitate to contact us again if you have any other concerns   Take care,  Dr. Kathleen Lime, MD

## 2023-04-07 NOTE — ED Triage Notes (Signed)
Pt reports dizziness since 0500. Pt was sent from pcp. Denies vision changes or extremity weakness.

## 2023-04-07 NOTE — Patient Instructions (Signed)
Go to emergency department now for further evaluation.  Transient Ischemic Attack A transient ischemic attack (TIA) causes the same symptoms as a stroke, but the symptoms go away quickly. A TIA happens when blood flow to the brain is blocked. Having a TIA means you may be at risk for a stroke. A TIA is a medical emergency. What are the causes? A TIA is caused by a blocked artery in the head or neck. This means the brain does not get the blood supply it needs. A blockage can be caused by: Fatty buildup in an artery in the head or neck. A blood clot. A tear in an artery. Irritation and swelling (inflammation) of an artery. Sometimes the cause is not known. What increases the risk? Certain things may make you more likely to have a TIA. Some of these are things that you can change, such as: Using products that have nicotine or tobacco. Not being active. Drinking too much alcohol. Using recreational drugs. Health conditions that may increase your risk include: High blood pressure. High cholesterol. Diabetes. Heart disease. A heartbeat that is not regular (atrial fibrillation). Sickle cell disease. Problems with blood clotting. Other risk factors include: Being over the age of 61. Being male. Being very overweight. Sleep problems (sleep apnea). Having a family history of stroke. Having had blood clots, stroke, TIA, or heart attack in the past. What are the signs or symptoms? The symptoms of a TIA are like those of a stroke. They can include: Weakness or loss of feeling in your face, arm, or leg. This often happens on one side of your body. Trouble walking. Trouble moving your arms or legs. Trouble talking or understanding what people are saying. Problems with how you see. Feeling dizzy. Feeling confused. Loss of balance or coordination. Feeling like you may vomit (nausea) or vomiting. Having a very bad headache. If you can, note what time you started to have symptoms. Tell your  doctor. How is this treated? The goal of treatment is to lower the risk for a stroke. This may include: Changes to diet and lifestyle, such as getting regular exercise and stopping smoking. Taking medicines to: Thin the blood. Lower blood pressure. Lower cholesterol. Treating other health conditions, such as diabetes. If testing shows that an artery in your brain is narrow, your doctor may recommend a procedure to: Take the blockage out of your artery. Open or widen an artery in your neck (carotid angioplasty and stenting). Follow these instructions at home: Medicines Take over-the-counter and prescription medicines only as told by your doctor. If you were told to take aspirin or another medicine to thin your blood, use it exactly as told by your doctor. Taking too much of the medicine can cause bleeding. Taking too little of the medicine may not work to treat the problem. Eating and drinking  Eat 5 or more servings of fruits and vegetables each day. Follow instructions from your doctor about your diet. You may need to follow a certain diet to help lower your risk of a stroke. You may need to: Eat a diet that is low in fat and salt. Eat foods with a lot of fiber. Limit carbohydrates and sugar. If you drink alcohol: Limit how much you have to: 0-1 drink a day for women who are not pregnant. 0-2 drinks a day for men. Know how much alcohol is in a drink. In the [place] one drink equals one 12 oz bottle of beer (355 mL), one 5 oz glass of wine (  148 mL), or one 1 oz glass of hard liquor (44 mL). General instructions Keep a healthy weight. Try to get at least 30 minutes of exercise on most days. Get treatment if you have sleep problems. Do not smoke or use any products that contain nicotine or tobacco. If you need help quitting, ask your doctor. Do not use drugs. Keep all follow-up visits. Your doctor will want to know if you have any more symptoms and to check blood labs if any  medicines were prescribed. Where to find more information American Stroke Association: stroke.org Get help right away if: You have chest pain. You have a heartbeat that is not regular. You have any signs of a stroke. "BE FAST" is an easy way to remember the main warning signs: B - Balance. Dizziness, sudden trouble walking, or loss of balance. E - Eyes. Trouble seeing or a change in how you see. F - Face. Sudden weakness or loss of feeling of the face. The face or eyelid may droop on one side. A - Arms. Weakness or loss of feeling in an arm. This happens all of a sudden and most often on one side of the body. S - Speech. Sudden trouble speaking, slurred speech, or trouble understanding what people say. T - Time. Time to call emergency services. Write down what time symptoms started. You have other signs of a stroke, such as: A sudden, very bad headache with no known cause. Feeling like you may vomit. Vomiting. A seizure. These symptoms may be an emergency. Get help right away. Call 911. Do not wait to see if the symptoms will go away. Do not drive yourself to the hospital. This information is not intended to replace advice given to you by your health care provider. Make sure you discuss any questions you have with your health care provider. Document Revised: 01/03/2022 Document Reviewed: 01/03/2022 Elsevier Patient Education  2024 [employer].

## 2023-04-07 NOTE — Assessment & Plan Note (Signed)
Multiple risk factors.  Patient with atherosclerotic vascular disease Sudden onset of dizziness feeling different than previous episodes Unsteady gait needing use of cane he does not normally use. No other neurological finding.  Low threshold for suspicion of TIA Needs workup and imaging Recommend emergency department evaluation today.

## 2023-04-07 NOTE — ED Provider Notes (Signed)
[place] EMERGENCY DEPARTMENT AT [facility] Provider Note   CSN: [id number] Arrival date & time: 04/07/23  1341     History  Chief Complaint  Patient presents with   Dizziness    Daniel Hampton is a 82 y.o. male.   Dizziness Is a 46-year-old male history of recent TIA ,vertigo , presenting to the ED from his PCPs office due to concerns for dizziness.  Patient said he woke up around 5:00 this morning ,doing his regular activity,drunk his coffee like he always do but felt dizzy around 10 am ,called his PCP and they told him to come in due to concerns for stroke.  Patient denies any vomiting.     Home Medications Prior to Admission medications   Medication Sig Start Date End Date Taking? Authorizing Provider  meclizine (ANTIVERT) 25 MG tablet Take 1 tablet (25 mg total) by mouth 3 (three) times daily as needed for dizziness. 04/07/23 05/07/23 Yes Kathleen Lime, MD  cetirizine (ZYRTEC) 10 MG tablet Take 1 tablet (10 mg total) by mouth daily. Patient taking differently: Take 10 mg by mouth daily as needed. 08/13/21   Pincus Sanes, MD  Coenzyme Q10 (COQ-10 PO) Take 1 tablet by mouth daily.    [provider]  famotidine (PEPCID) 20 MG tablet Take 20 mg by mouth 2 (two) times daily. 07/02/21   [provider]  Multiple Vitamins-Minerals (ALIVE MULTI-VITAMIN PO) Take 1 tablet by mouth daily.    [provider]  REPATHA SURECLICK 140 MG/ML SOAJ inject 1ml into the skin every 14 days 03/19/23   Marykay Lex, MD  rosuvastatin (CRESTOR) 5 MG tablet Take 1 tablet (5 mg total) by mouth 3 (three) times a week. 01/05/23   Pincus Sanes, MD  valACYclovir (VALTREX) 1000 MG tablet Take by mouth. 12/23/22   [provider]      Allergies    Atorvastatin, Crestor [rosuvastatin], Imuran [azathioprine], and Tape    Review of Systems   Review of Systems  Neurological:  Positive for dizziness.    Physical Exam Updated Vital Signs BP 134/80    Pulse 92   Temp 98.2 F (36.8 C)   Resp 17   Ht 5\' 10"  (1.778 m)   Wt 108 kg   SpO2 94%   BMI 34.15 kg/m  Physical Exam Constitutional:      General: He is not in acute distress.    Appearance: He is not ill-appearing, toxic-appearing or diaphoretic.  HENT:     Head: Normocephalic and atraumatic.  Neck:     Vascular: No carotid bruit.  Cardiovascular:     Rate and Rhythm: Normal rate and regular rhythm.     Pulses: Normal pulses.     Heart sounds: Normal heart sounds.     No friction rub.  Pulmonary:     Effort: Pulmonary effort is normal.  Musculoskeletal:     Cervical back: No rigidity or tenderness.  Lymphadenopathy:     Cervical: No cervical adenopathy.  Skin:    General: Skin is warm and dry.  Neurological:     Mental Status: He is alert.     Comments: Negative test of skew test Negative central pattern of nystagmus       ED Results / Procedures / Treatments   Labs (all labs ordered are listed, but only abnormal results are displayed) Labs Reviewed  CBG MONITORING, ED - Abnormal; Notable for the following components:      Result Value  Glucose-Capillary 104 (*)    All other components within normal limits  BASIC METABOLIC PANEL  CBC  URINALYSIS, ROUTINE W REFLEX MICROSCOPIC    EKG None  Radiology MR BRAIN WO CONTRAST  Result Date: 04/07/2023 CLINICAL DATA:  Initial evaluation for neuro deficit, stroke suspected. EXAM: MRI HEAD WITHOUT CONTRAST TECHNIQUE: Multiplanar, multiecho pulse sequences of the brain and surrounding structures were obtained without intravenous contrast. COMPARISON:  None Available. FINDINGS: Brain: Cerebral volume within normal limits. Mild chronic microvascular ischemic disease for age. No evidence for acute or subacute ischemia. Gray-white matter differentiation maintained. No acute intracranial hemorrhage. Single chronic microhemorrhage noted within the pons. No other chronic intracranial blood products. No mass lesion, midline  shift or mass effect no hydrocephalus or extra-axial fluid collection. Pituitary gland suprasellar region within normal limits. Vascular: Major intracranial vascular flow voids are maintained. Skull and upper cervical spine: Craniocervical junction within normal limits. Bone marrow signal intensity normal. No scalp soft tissue abnormality. Sinuses/Orbits: Prior bilateral ocular lens replacement. Paranasal sinuses and mastoid air cells are largely clear. Other: None. IMPRESSION: 1. No acute intracranial abnormality. 2. Mild chronic microvascular ischemic disease for age. Electronically Signed   By: Rise Mu M.D.   On: 04/07/2023 19:05    Procedures Procedures    Medications Ordered in ED Medications  meclizine (ANTIVERT) tablet 50 mg (50 mg Oral Given 04/07/23 1729)  LORazepam (ATIVAN) injection 0.5 mg (0.5 mg Intravenous Given 04/07/23 1735)    ED Course/ Medical Decision Making/ A&P Clinical Course as of 04/07/23 2003  Tue Apr 07, 2023  1653 Stable 37 YOM with a chief complaint of dizziness  HX of chronic vertigo PCP sent to ER for CVA rule out due to worsening symptoms.  Spouse at bedside said he is back at his baseline [CC]    Clinical Course User Index [CC] Glyn Ade, MD                                 Medical Decision Making Amount and/or Complexity of Data Reviewed Labs: ordered. Radiology: ordered.    Details: MRI Brain WO contrast   #Dizziness  #Vertigo 82 year old male , hx of history of vertigo, benign paroxysmal position, CAD and hyperlipidemia presented to the emergency hospital from his PCP due to concerns for dizziness and for CVA rule out.  On exam patient is oriented to time, place and person .Wife at bedside said patient does not seem to have any subtle confusion at this time.  Stable on presentation ,no signs for hypotension.  Denies any recent use of illicit drugs or excessive alcohol use.. I wonder if this is  BPPV vs vestibular neuritis ,vs  labyrinthine concussion.   On exam ,patient had negative head impulse test, negative skew deviation test and central pattern of nystagmus was also unremarkable.  His exam and presentation seem consistent with benign paroxysmal positional vertigo rather than a central processes like stroke.  However I ordered MRI of the head without contrast to completely rule out stroke.  Will also order meclizine 80 mg for his vertigo.  I ordered Ativan 0.5 mg IV for claustrophobia for the MRI   MRI showed no acute intracranial abnormality , but evidence of mild chronic microvascular ischemic disease appropriate for age .   Disposition: After consideration of the diagnostic results ,my workup does not suggest an emergent condition requiring admission or immediate intervention beyond what has been performed at this  time. The patient is safe for discharge and has been instructed to return immediately for worsening symptoms, change in symptoms or any other concerns.  I discussed this case with my attending physician Dr. Doran Durand who cosigned this note including patient's presenting symptoms, physical exam, and planned diagnostics and interventions. Attending physician stated agreement with plan or made changes to plan which were implemented.             Final Clinical Impression(s) / ED Diagnoses Final diagnoses:  Dizziness    Rx / DC Orders ED Discharge Orders          Ordered    meclizine (ANTIVERT) 25 MG tablet  3 times daily PRN        04/07/23 1948              Kathleen Lime, MD 04/07/23 2006    Glyn Ade, MD 04/07/23 2243

## 2023-04-07 NOTE — Assessment & Plan Note (Signed)
Clinically stable.  Differential diagnosis discussed. Cannot rule out central etiology. Needs neuroimaging and emergency department evaluation.

## 2023-04-13 ENCOUNTER — Encounter: Payer: Self-pay | Admitting: Internal Medicine

## 2023-04-13 NOTE — Progress Notes (Unsigned)
Subjective:    Patient ID: Daniel Hampton, male    DOB: 08-Jun-1941, 82 y.o.   MRN: [id number]     HPI [name] is here for follow up from the ED.  Ed 9/3 for dizziness  H/o TIA, vertigo in past presented from our office to ED for evaluation of dizziness.  That morning he was doing his usual activities and at 10 am started to feel dizzy.  VSS, exam normal.  Given meclizine and ativan for MRI.  MRI brain w/o acute abnormality but chronic microvascular disease appropriate for age.   Ddx:  BPPV vs vestibular neuritis ,vs labyrinthine concussion   Discharged home with meclizine 25 mg tid prn  He had vertigo a few years ago.    The meclizine made him very drowsy.  Today he took non-drowsy dramamine and it did not cause him to be tired.    He gets vertigo when he leans to the right or rolling over at night.     Changed from claritin to allegra- D.  This has seemed to help a little bit with some of his congestion and fullness he feels.  He is only taken this for 3 days.   Medications and allergies reviewed with patient and updated if appropriate.  Current Outpatient Medications on File Prior to Visit  Medication Sig Dispense Refill   Coenzyme Q10 (COQ-10 PO) Take 1 tablet by mouth daily.     famotidine (PEPCID) 20 MG tablet Take 20 mg by mouth 2 (two) times daily.     meclizine (ANTIVERT) 25 MG tablet Take 1 tablet (25 mg total) by mouth 3 (three) times daily as needed for dizziness. 30 tablet 0   Multiple Vitamins-Minerals (ALIVE MULTI-VITAMIN PO) Take 1 tablet by mouth daily.     mycophenolate (CELLCEPT) 250 MG capsule Take 750 mg by mouth 2 (two) times daily.     prednisoLONE acetate (PRED FORTE) 1 % ophthalmic suspension Place 1 drop into the right eye daily.     REPATHA SURECLICK 140 MG/ML SOAJ inject 1ml into the skin every 14 days 6 mL 0   valACYclovir (VALTREX) 1000 MG tablet Take by mouth.     rosuvastatin (CRESTOR) 5 MG tablet Take 1 tablet (5 mg total) by mouth 3 (three) times  a week. (Patient not taking: Reported on 04/14/2023) 13 tablet 3   No current facility-administered medications on file prior to visit.     Review of Systems  Constitutional:  Negative for fever.  HENT:  Negative for congestion and sore throat.   Respiratory:  Negative for shortness of breath.   Cardiovascular:  Negative for chest pain and palpitations.  Neurological:  Positive for dizziness and headaches (sometimes wakes up with them).       Objective:   Vitals:   04/14/23 1414  BP: 120/76  Pulse: 62  Temp: 98.3 F (36.8 C)  SpO2: 96%   BP Readings from Last 3 Encounters:  04/14/23 120/76  04/07/23 136/75  04/07/23 132/72   Wt Readings from Last 3 Encounters:  04/14/23 226 lb 12.8 oz (102.9 kg)  04/07/23 238 lb (108 kg)  04/07/23 228 lb (103.4 kg)   Body mass index is 32.54 kg/m.    Physical Exam Constitutional:      General: He is not in acute distress.    Appearance: Normal appearance. He is not ill-appearing.  HENT:     Head: Normocephalic and atraumatic.     Right Ear: Tympanic membrane, ear canal  and external ear normal. There is no impacted cerumen.     Left Ear: Tympanic membrane, ear canal and external ear normal. There is no impacted cerumen.     Mouth/Throat:     Mouth: Mucous membranes are moist.     Pharynx: No posterior oropharyngeal erythema.  Eyes:     Conjunctiva/sclera: Conjunctivae normal.  Cardiovascular:     Rate and Rhythm: Normal rate and regular rhythm.     Heart sounds: Normal heart sounds.  Pulmonary:     Effort: Pulmonary effort is normal. No respiratory distress.     Breath sounds: Normal breath sounds. No wheezing or rales.  Musculoskeletal:     Right lower leg: No edema.     Left lower leg: No edema.  Skin:    General: Skin is warm and dry.     Findings: No rash.  Neurological:     General: No focal deficit present.     Mental Status: He is alert. Mental status is at baseline.        Lab Results  Component Value Date    WBC 9.0 04/07/2023   HGB 15.9 04/07/2023   HCT 47.1 04/07/2023   PLT 230 04/07/2023   GLUCOSE 97 04/07/2023   CHOL 95 02/16/2023   TRIG 133.0 02/16/2023   HDL 47.20 02/16/2023   LDLDIRECT 88.0 02/03/2022   LDLCALC 22 02/16/2023   ALT 21 02/16/2023   AST 25 02/16/2023   NA 139 04/07/2023   K 4.3 04/07/2023   CL 104 04/07/2023   CREATININE 0.96 04/07/2023   BUN 17 04/07/2023   CO2 26 04/07/2023   TSH 2.80 01/18/2020   PSA 5.31 08/02/2018   HGBA1C 6.1 12/31/2022   MICROALBUR 0.7 12/31/2022   MR BRAIN WO CONTRAST CLINICAL DATA:  Initial evaluation for neuro deficit, stroke suspected.  EXAM: MRI HEAD WITHOUT CONTRAST  TECHNIQUE: Multiplanar, multiecho pulse sequences of the brain and surrounding structures were obtained without intravenous contrast.  COMPARISON:  None Available.  FINDINGS: Brain: Cerebral volume within normal limits. Mild chronic microvascular ischemic disease for age. No evidence for acute or subacute ischemia. Gray-white matter differentiation maintained. No acute intracranial hemorrhage. Single chronic microhemorrhage noted within the pons. No other chronic intracranial blood products.  No mass lesion, midline shift or mass effect no hydrocephalus or extra-axial fluid collection. Pituitary gland suprasellar region within normal limits.  Vascular: Major intracranial vascular flow voids are maintained.  Skull and upper cervical spine: Craniocervical junction within normal limits. Bone marrow signal intensity normal. No scalp soft tissue abnormality.  Sinuses/Orbits: Prior bilateral ocular lens replacement. Paranasal sinuses and mastoid air cells are largely clear.  Other: None.  IMPRESSION: 1. No acute intracranial abnormality. 2. Mild chronic microvascular ischemic disease for age.  Electronically Signed   By: Rise Mu M.D.   On: 04/07/2023 19:05    Assessment & Plan:    See Problem List for Assessment and Plan of  chronic medical problems.     I spent 25 minutes dedicated to the care of this patient on the date of this encounter including review of recent labs, imaging and procedures, ED  notes, obtaining history, communicating with the patient, and documenting clinical information in the EHR

## 2023-04-14 ENCOUNTER — Encounter: Payer: Self-pay | Admitting: Internal Medicine

## 2023-04-14 ENCOUNTER — Ambulatory Visit (INDEPENDENT_AMBULATORY_CARE_PROVIDER_SITE_OTHER): Payer: Medicare Other | Admitting: Internal Medicine

## 2023-04-14 VITALS — BP 120/76 | HR 62 | Temp 98.3°F | Ht 70.0 in | Wt 226.8 lb

## 2023-04-14 DIAGNOSIS — Z7952 Long term (current) use of systemic steroids: Secondary | ICD-10-CM | POA: Insufficient documentation

## 2023-04-14 DIAGNOSIS — H8110 Benign paroxysmal vertigo, unspecified ear: Secondary | ICD-10-CM | POA: Diagnosis not present

## 2023-04-14 NOTE — Assessment & Plan Note (Signed)
Acute Had recent episode of severe vertigo and has continued to have a little bit of vertigo since then Was seen here and in the emergency room Imaging was negative for acute stroke Does seem to be related to changes in positions likely BPPV Meclizine does make him drowsy and he did start taking nondrowsy Dramamine-okay to continue since this is helping Expect this to slowly improve and resolve Discussed vestibular PT-we will see how he does over the next several days and if it does not continue to get better or resolve he will let me know and I will refer him

## 2023-04-14 NOTE — Patient Instructions (Addendum)
      Medications changes include :   continue the non-drowsy dramamine.       We can consider physical therapy for the vertigo.     Return if symptoms worsen or fail to improve, for follow up in January.

## 2023-04-22 ENCOUNTER — Ambulatory Visit: Payer: Medicare Other | Attending: Cardiology | Admitting: Cardiology

## 2023-04-22 ENCOUNTER — Encounter: Payer: Self-pay | Admitting: Cardiology

## 2023-04-22 VITALS — BP 132/86 | HR 65 | Ht 70.0 in | Wt 229.4 lb

## 2023-04-22 DIAGNOSIS — I7090 Unspecified atherosclerosis: Secondary | ICD-10-CM

## 2023-04-22 DIAGNOSIS — R7303 Prediabetes: Secondary | ICD-10-CM

## 2023-04-22 DIAGNOSIS — G720 Drug-induced myopathy: Secondary | ICD-10-CM

## 2023-04-22 DIAGNOSIS — T466X5A Adverse effect of antihyperlipidemic and antiarteriosclerotic drugs, initial encounter: Secondary | ICD-10-CM

## 2023-04-22 DIAGNOSIS — R931 Abnormal findings on diagnostic imaging of heart and coronary circulation: Secondary | ICD-10-CM

## 2023-04-22 DIAGNOSIS — I2510 Atherosclerotic heart disease of native coronary artery without angina pectoris: Secondary | ICD-10-CM

## 2023-04-22 DIAGNOSIS — R42 Dizziness and giddiness: Secondary | ICD-10-CM

## 2023-04-22 DIAGNOSIS — G459 Transient cerebral ischemic attack, unspecified: Secondary | ICD-10-CM

## 2023-04-22 DIAGNOSIS — E785 Hyperlipidemia, unspecified: Secondary | ICD-10-CM

## 2023-04-22 NOTE — Patient Instructions (Signed)
Medication Instructions:    You can take Repatha every 3 weeks instead of 2 weeks as discussed   *If you need a refill on your cardiac medications before your next appointment, please call your pharmacy*   Lab Work: Not needed    Testing/Procedures: Not needed   Follow-Up: At [facility], you and your health needs are our priority.  As part of our continuing mission to provide you with exceptional heart care, we have created designated Provider Care Teams.  These Care Teams include your primary Cardiologist (physician) and Advanced Practice Providers (APPs -  Physician Assistants and Nurse Practitioners) who all work together to provide you with the care you need, when you need it.     Your next appointment:   6 month(s)  The format for your next appointment:   In Person  Provider:   Edd Fabian, FNP    Then, Bryan Lemma, MD will plan to see you again in 12 month(s).

## 2023-04-22 NOTE — Progress Notes (Signed)
Cardiology Office Note:  .   Date:  05/03/2023  ID:  Daniel, Hampton 1941/07/30, MRN [id number] PCP: Daniel Sanes, MD  [facility] Providers Cardiologist:  Daniel Lemma, MD     Chief Complaint  Patient presents with   Follow-up    Schedule 50-month follow-up.  Doing well with exception of the ER visit for dizziness.   Hospitalization Follow-up    ER visit for dizziness.  R/O CVA   Coronary Artery Disease    No chest pain or pressure    History of Present Illness: .     Daniel Hampton is a very pleasant/otherwise healthy appearing 82 y.o. male  with a notable PMH below, ho presents here for routine 30-month but also hospital follow-up at the request of Daniel Sanes, MD.  Notable PMH Nonobstructive coronary artery disease. Coronary CTA with FFR 01/24/2022: Calcium score 1540 (76 percentile). Calcified plaque distal LM.  Extensive calcified plaque proximal LAD.  Calcified plaque proximal LCx.  Diffuse mixed plaque throughout RCA, PDA, PLV.  All stenosis appears mild.  FFR did not show any significant stenosis.  Ascending thoracic aortic dilatation. CTA 01/2022: Stable dilatation ascending thoracic aorta 4.2 cm. CTA 12/22/2022: Patient is escorted measured at 4.2 cm Hyperlipidemia. Lipid panel 06/02/2022: LDL -14, HDL 43, TG 177, total 64. On Repatha +5 mg rosuvastatin can think Emphysema. Anterior uveitis. 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  T2DM. Diet controlled.  I last saw Daniel Hampton on April 01, 2022 -> He was referred to [facility] for PCSK9 inhibitor, and was seen by Daniel Hampton, RPH from [facility], started on Repatha and kept on low-dose rosuvastatin 5 mg 3 times a week.  He was last seen on 10/07/2022 by Daniel Levering, NP -> doing well.  No cardiac symptoms.  Not being able to golf because of issues of uveitis.  Staying active though walking a 1 3/4 mile loop in the park at least 2 or 3 times a week.  They discussed stopping rosuvastatin at that  time, but he is continue to take it.   ER visit 04/10/2023 for dizziness.  Woke up at 5 AM with regular activity.  Drank coffee but felt dizzy about 10 AM. .  Concern for stroke.  Ruled out for CVA    Subjective  INTERVAL HISTORY Daniel Hampton returns here today for routine follow-up stating that he is doing fairly well.  He backed off on his exercise and not being on a play golf probably because he was helping to care for his wife after he had his issues in the with his eyes with uveitis.  But he is open to get back now the weather, change and his wife is doing better.  He is now up to walking about 2 miles every other day and does fairly well.  Every now and then he feels a rare flutter sensation if he is walking up a hill, but has not had any chest pain or pressure associated with it.  He is able to keep walking without any need to stop. He denies any PND, orthopnea edema.  No syncope or near syncope.  No TIA or amaurosis fugax.  No claudication. No myalgias or arthralgias.  No choking or sore throat or hoarseness.   ROS:  Review of Systems - Negative except being a little bit out of shape from not playing golf, eyes are not quite as blurry but still not back to baseline.  3 times a night nocturia.  Objective   Studies Reviewed: Marland Kitchen      Any blood in urine blood in stool Dr. Renne Crigler stool any swelling legs okay any passout spells no past hospital he had the dizziness for the past yeah you forget about stroke yes so but right right yeah CT Angio Chest 12/17/2022: Unchanged findings.  Ascending aorta estimated 41 mm.  Stable.  Plan to recheck in 3 years.  Risk Assessment/Calculations:         Physical Exam:   VS:  BP 132/86   Pulse 65   Ht 5\' 10"  (1.778 m)   Wt 229 lb 6.4 oz (104.1 kg)   SpO2 95%   BMI 32.92 kg/m    Wt Readings from Last 3 Encounters:  04/22/23 229 lb 6.4 oz (104.1 kg)  04/14/23 226 lb 12.8 oz (102.9 kg)  04/07/23 238 lb (108 kg)    GEN: Well nourished, well  developed in no acute distress; relatively healthy appearing for stated age, mildly obese.  Well-groomed. NECK: No JVD; No carotid bruits CARDIAC: Normal S1, S2; RRR, no murmurs, rubs, gallops RESPIRATORY:  Clear to auscultation without rales, wheezing or rhonchi ; nonlabored, good air movement. ABDOMEN: Soft, non-tender, non-distended EXTREMITIES:  No edema; No deformity; normal strength.     ASSESSMENT AND PLAN: .    Problem List Items Addressed This Visit       Cardiology Problems   Atherosclerotic vascular disease    Coronary disease as well as aortic disease. On Repatha.      Coronary artery disease, non-occlusive - Primary (Chronic)    Doing well with no active anginal symptoms.  Staying active but hoping to become more active again as the weather improves.  Continue to encourage his level activity.  Plan: Continue Repatha, but okay to back down to every 3 weeks during the donut hole timeframe. Continue off statin. I did recommend that he take aspirin 81 mg but he would prefer not to.  (Recommend that he take at least 3 to 4 days a week)      Hyperlipidemia with target LDL less than 70 (Chronic)    Labs from July showed LDL of 22.  => Agree with stopping rosuvastatin and I think at least until the end of the donut hole months, he can back off to every 3 weeks dosing and we can reassess in 6 months.      TIA (transient ischemic attack)    If there was indeed a concern that the episode back in earlier this month was related to TIA, I do think he would benefit from being on daily aspirin 81 mg.        Other   Dizziness    No more episodes of dizziness.  I really suspect that the episode he had was probably related to dehydration.  Stressed the importance of staying adequately hydrated.  Also realizing that caffeine is a diuretic.      Prediabetes (Chronic)    Follow A1c with lipid panel.  Discussed dietary habits and regular exercise.      Statin myopathy  (Chronic)    Intolerant of even low doses of simvastatin and atorvastatin.  Was doing okay with 3 times a week rosuvastatin, but having started Repatha and does not necessarily need a statin               Dispo: Return in about 6 months (around 10/20/2023) for Alternate 6 month follow-up with APP & MD.  Total time spent: 24 min spent  with patient + 13 min spent charting = 37 min     Signed, Marykay Lex, MD, MS Daniel Hampton, M.D., M.S. Interventional Cardiologist  [facility]  Pager # [phone] Phone # [phone] [address] [place], [place] [address]

## 2023-04-24 ENCOUNTER — Telehealth: Payer: Self-pay

## 2023-04-24 NOTE — Telephone Encounter (Signed)
Transition Care Management Unsuccessful Follow-up Telephone Call  Date of discharge and from where:  Daniel [facility] 9/3  Attempts:  1st Attempt  Reason for unsuccessful TCM follow-up call:  No answer/busy   Daniel [place]  [facility], Phone: [phone] Website: [name]

## 2023-04-27 ENCOUNTER — Encounter: Payer: Self-pay | Admitting: Internal Medicine

## 2023-04-27 ENCOUNTER — Telehealth: Payer: Self-pay

## 2023-04-27 NOTE — Telephone Encounter (Signed)
Transition Care Management Follow-up Telephone Call Date of discharge and from where: [facility] 9/3 How have you been since you were released from the hospital? Patient is still having vertigo and following up with PCP  Any questions or concerns? No  Items Reviewed: Did the pt receive and understand the discharge instructions provided? Yes  Medications obtained and verified? Yes  Other? No  Any new allergies since your discharge? No  Dietary orders reviewed? No Do you have support at home? Yes    Follow up appointments reviewed:  PCP Hospital f/u appt confirmed? Yes  Scheduled to see  on  @  Specialist Hospital f/u appt confirmed? No  Scheduled to see  on  @ . Are transportation arrangements needed? No  If their condition worsens, is the pt aware to call PCP or go to the Emergency Dept.? Yes Was the patient provided with contact information for the PCP's office or ED? Yes Was to pt encouraged to call back with questions or concerns? Yes

## 2023-04-28 MED ORDER — MECLIZINE HCL 25 MG PO TABS: 12.5000 mg | ORAL_TABLET | Freq: Three times a day (TID) | ORAL | 2 refills | Status: DC | PRN

## 2023-05-03 ENCOUNTER — Encounter: Payer: Self-pay | Admitting: Cardiology

## 2023-05-03 NOTE — Assessment & Plan Note (Signed)
No more episodes of dizziness.  I really suspect that the episode he had was probably related to dehydration.  Stressed the importance of staying adequately hydrated.  Also realizing that caffeine is a diuretic.

## 2023-05-03 NOTE — Assessment & Plan Note (Signed)
Follow A1c with lipid panel.  Discussed dietary habits and regular exercise.

## 2023-05-03 NOTE — Assessment & Plan Note (Signed)
If there was indeed a concern that the episode back in earlier this month was related to TIA, I do think he would benefit from being on daily aspirin 81 mg.

## 2023-05-03 NOTE — Assessment & Plan Note (Signed)
Intolerant of even low doses of simvastatin and atorvastatin.  Was doing okay with 3 times a week rosuvastatin, but having started Repatha and does not necessarily need a statin

## 2023-05-03 NOTE — Assessment & Plan Note (Signed)
Coronary disease as well as aortic disease. On Repatha.

## 2023-05-03 NOTE — Assessment & Plan Note (Addendum)
Doing well with no active anginal symptoms.  Staying active but hoping to become more active again as the weather improves.  Continue to encourage his level activity.  Plan: Continue Repatha, but okay to back down to every 3 weeks during the donut hole timeframe. Continue off statin. I did recommend that he take aspirin 81 mg but he would prefer not to.  (Recommend that he take at least 3 to 4 days a week)

## 2023-05-03 NOTE — Assessment & Plan Note (Signed)
Labs from July showed LDL of 22.  => Agree with stopping rosuvastatin and I think at least until the end of the donut hole months, he can back off to every 3 weeks dosing and we can reassess in 6 months.

## 2023-05-31 ENCOUNTER — Encounter: Payer: Self-pay | Admitting: Internal Medicine

## 2023-05-31 DIAGNOSIS — R42 Dizziness and giddiness: Secondary | ICD-10-CM

## 2023-06-02 ENCOUNTER — Ambulatory Visit: Payer: Medicare Other | Attending: Internal Medicine

## 2023-06-02 DIAGNOSIS — R42 Dizziness and giddiness: Secondary | ICD-10-CM | POA: Insufficient documentation

## 2023-06-02 DIAGNOSIS — R2681 Unsteadiness on feet: Secondary | ICD-10-CM | POA: Diagnosis not present

## 2023-06-02 NOTE — Therapy (Signed)
OUTPATIENT PHYSICAL THERAPY VESTIBULAR EVALUATION     Patient Name: Daniel Hampton MRN: [id number] DOB:1940-08-19, 82 y.o., male Today's Date: 06/02/2023  END OF SESSION:  PT End of Session - 06/02/23 1436     Visit Number 1    Number of Visits 9    Date for PT Re-Evaluation 07/03/23    Authorization Type [employer]BS medicare    PT Start Time 1442    PT Stop Time 1530    PT Time Calculation (min) 48 min    Activity Tolerance Patient tolerated treatment well    Behavior During Therapy WFL for tasks assessed/performed             Past Medical History:  Diagnosis Date   Allergy    SEASONAL   Arthritis    Asthma    COLONIC POLYPS, HX OF 03/12/2007   DISC DISEASE, LUMBAR 12/03/2008   DIVERTICULOSIS, COLON 10/17/2009   History of kidney stones    Hydrocele    HYPERLIPIDEMIA 03/12/2007   Hyphema of right eye    Complicated by allergic conjunctivitis   Melanoma (HCC) 04/23/2020   in situ-Left Upper Arm   MORTON'S NEUROMA, RIGHT 03/13/2008   SCC (squamous cell carcinoma) 04/26/2013   right hand-txpbx   SKIN CANCER, HX OF 10/17/2009   VERTIGO, BENIGN PAROXYSMAL POSITION 04/28/2007   Past Surgical History:  Procedure Laterality Date   BACK SURGERY     COLONOSCOPY W/ BIOPSIES     CT CTA CORONARY W/CA SCORE W/CM &/OR WO/CM  01/27/2022   1. Coronary artery calcium score 1540 Agatston units. This places the patient in the 76th percentile for age and gender, suggesting intermediate risk for future events.  2. Extensive coronary disease but appears nonobstructive. This is confirmed by CT FFR. 3. 4.2 cm dilation of the ascending aorta,   EYE SURGERY Left    LUMBAR LAMINECTOMY/ DECOMPRESSION WITH MET-RX Left 03/01/2021   Procedure: LEFT LUMBAR FOUR-FIVE MINIMALLY INVASIVE LAMINECTOMY, LATERAL RECESS DECOMPRESSION AND RESECTION OF SYNOVIAL CYST;  Surgeon: Bethann Goo, DO;  Location: [facility] OR;  Service: Neurosurgery;  Laterality: Left;   MOHS SURGERY     SPINE SURGERY     lumbar disk  surgery   Patient Active Problem List   Diagnosis Date Noted   Long term (current) use of systemic steroids 04/14/2023   TIA (transient ischemic attack) 04/07/2023   Dizziness 04/07/2023   Atherosclerotic vascular disease 04/07/2023   Statin myopathy 12/31/2022   Hepatitis A antibody positive 05/12/2022   Mature cataract 04/14/2022   Peripheral focal chorioretinal inflammation of both eyes 04/14/2022   Posterior synechiae (iris), 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   Retinal edema 04/14/2022   Chronic allergic conjunctivitis 04/10/2022   Gastro-esophageal reflux disease without esophagitis 04/10/2022   Coronary artery disease, non-occlusive 04/01/2022   Exercise intolerance 01/06/2022   Elevated coronary artery calcium score 12/26/2021   Diabetes (HCC) 08/12/2021   Prediabetes 08/12/2021   Emphysema lung (HCC) 07/02/2021   Lung nodule seen on imaging study 07/02/2021   Chronic cough 07/02/2021   Lumbar radiculopathy L5 s/p L4-5 laminectomy 7/22 02/06/2021   Aortic atherosclerosis (HCC) 01/18/2020   DIVERTICULOSIS, COLON 10/17/2009   H/O melanoma in situ 10/17/2009   DISC DISEASE, LUMBAR 12/03/2008   Morton's neuroma of both feet 03/13/2008   VERTIGO, BENIGN PAROXYSMAL POSITION 04/28/2007   Hyperlipidemia with target LDL less than 70 03/12/2007   History of colonic polyps 03/12/2007    PCP: Cheryll Cockayne, MD REFERRING PROVIDER: Cheryll Cockayne, MD  REFERRING DIAG: [phone] (  ICD-10-CM) - Vertigo  THERAPY DIAG:  Unsteadiness on feet - Plan: PT plan of care cert/re-cert  Dizziness and giddiness - Plan: PT plan of care cert/re-cert  ONSET DATE: 06/01/2023  Rationale for Evaluation and Treatment: Rehabilitation  SUBJECTIVE:   SUBJECTIVE STATEMENT: Patient arrives to clinic alone, no AD. Reports that his dizziness started around Labor Day. Will get dizzy tipping his head back to put eye drops in. Does have a h/o R eye trauma resulting in reduced vision. Meclizine seems to help a little, but does  make him very drowsy. Last took it ~2 days ago.  Pt accompanied by: self  PERTINENT HISTORY: arthritis, asthma, skin CA, HLD  PAIN:  Are you having pain? No  PRECAUTIONS: Fall and Other: R eye vision impaired   WEIGHT BEARING RESTRICTIONS: No  FALLS: Has patient fallen in last 6 months? Yes. Number of falls 1; slipped on his patio  LIVING ENVIRONMENT: Lives with: lives with their family Lives in: House/apartment Stairs: Yes: Internal: flight steps; on right going up and External: 3 steps; on right going up Has following equipment at home: None  PLOF: Independent  PATIENT GOALS: "to get better with this dizziness- to play golf"  OBJECTIVE:  Note: Objective measures were completed at Evaluation unless otherwise noted.  DIAGNOSTIC FINDINGS: 04/07/23 Brain MRI  IMPRESSION: 1. No acute intracranial abnormality. 2. Mild chronic microvascular ischemic disease for age.  COGNITION: Overall cognitive status: Within functional limits for tasks assessed   SENSATION: N/T in B LE   Cervical ROM:   WFL, pain free  STRENGTH: WFL   BED MOBILITY:  Endorses dizziness, independent   GAIT: Gait pattern: wide BOS  PATIENT SURVEYS:  FOTO staff did not capture  VESTIBULAR ASSESSMENT:  GENERAL OBSERVATION: NAD, no AD   SYMPTOM BEHAVIOR:  Subjective history: see above  Non-Vestibular symptoms:  none  Type of dizziness: Imbalance (Disequilibrium)  Frequency: daily  Duration: varies  Aggravating factors: Induced by position change: rolling to the right, rolling to the left, supine to sit, and sit to stand, Induced by motion: occur when walking, turning body quickly, and turning head quickly, and Worse in the morning  Relieving factors: medication and rest  Progression of symptoms: better  OCULOMOTOR EXAM:  Ocular Alignment:  R eye inferior, lateral related to eye trauma   Ocular ROM: No Limitations  Spontaneous Nystagmus: absent  Gaze-Induced Nystagmus: absent  Smooth  Pursuits: intact  Saccades: intact  Convergence/Divergence: <5 cm   VESTIBULAR - OCULAR REFLEX:   Slow VOR: Normal  VOR Cancellation: Normal  Head-Impulse Test: HIT Right: negative HIT Left: positive  Dynamic Visual Acuity: to be assessed PRN   POSITIONAL TESTING: Right Dix-Hallpike: no nystagmus Left Dix-Hallpike: upbeating, left nystagmus Right Roll Test: no nystagmus Left Roll Test: nystagmus indicative of L posterior canal canalithiasis   MOTION SENSITIVITY:  Motion Sensitivity Quotient Intensity: 0 = none, 1 = Lightheaded, 2 = Mild, 3 = Moderate, 4 = Severe, 5 = Vomiting  Intensity  1. Sitting to supine   2. Supine to L side   3. Supine to R side   4. Supine to sitting   5. L Hallpike-Dix   6. Up from L    7. R Hallpike-Dix   8. Up from R    9. Sitting, head tipped to L knee   10. Head up from L knee   11. Sitting, head tipped to R knee   12. Head up from R knee   13. Sitting head turns x5  14.Sitting head nods x5   15. In stance, 180 turn to L    16. In stance, 180 turn to R     VESTIBULAR TREATMENT:                                                                                                   Canalith Repositioning:  Epley Left: Number of Reps: 2 and Response to Treatment: symptoms improved  PATIENT EDUCATION: Education details: PT POC, exam findings, adjustments to positioning for placing eye drops Person educated: Patient Education method: Explanation and Demonstration Education comprehension: verbalized understanding and needs further education  HOME EXERCISE PROGRAM: To be provided GOALS: Goals reviewed with patient? Yes  SHORT TERM GOALS: = LTG based on PT POC length   LONG TERM GOALS: Target date: 07/03/23  Pt will be independent with final HEP for improved symptom report  Baseline:  Goal status: INITIAL  2.  Patient will demonstrate (-) positional testing to indicate resolution of BPPV  Baseline: L posterior canal canalithiasis   Goal status: INITIAL  3.  MSQ goal  Baseline: to be assessed Goal status: INITIAL  4.  FOTO goal Baseline: to be completed Goal status: INITIAL  ASSESSMENT:  CLINICAL IMPRESSION: Patient is a 82 y.o. male who was seen today for physical therapy evaluation and treatment for dizziness. He has a long h/o vertigo. Exam results today suggest a L vestibular hypofunction and at least L posterior canal canalithiasis, treated with Epley x2 with improved symptom report. Time constraints limiting assessment of R semicircular canals. He would benefit from skilled PT services to address the above mentioned deficits.   OBJECTIVE IMPAIRMENTS: dizziness.   ACTIVITY LIMITATIONS: bending, squatting, bed mobility, hygiene/grooming, locomotion level, and caring for others  PARTICIPATION LIMITATIONS: medication management, interpersonal relationship, driving, shopping, community activity, and yard work  PERSONAL FACTORS: Age, Past/current experiences, Time since onset of injury/illness/exacerbation, and 3+ comorbidities: see above  are also affecting patient's functional outcome.   REHAB POTENTIAL: Good  CLINICAL DECISION MAKING: Evolving/moderate complexity  EVALUATION COMPLEXITY: Moderate   PLAN:  PT FREQUENCY: 2x/week  PT DURATION: 4 weeks  PLANNED INTERVENTIONS: 97164- PT Re-evaluation, 97110-Therapeutic exercises, 97530- Therapeutic activity, 97112- Neuromuscular re-education, 97535- Self Care, 86578- Manual therapy, [phone]- Gait training, [phone]- Orthotic Fit/training, [phone]- Canalith repositioning, [phone]- Aquatic Therapy, Patient/Family education, Balance training, Stair training, Vestibular training, Visual/preceptual remediation/compensation, and DME instructions  PLAN FOR NEXT SESSION: MSQ, FOTO, recheck positional PRN (might be R canal too)    Westley Foots, PT Westley Foots, PT, DPT, CBIS  06/02/2023, 4:15 PM

## 2023-06-05 ENCOUNTER — Ambulatory Visit: Payer: Medicare Other | Attending: Internal Medicine | Admitting: Physical Therapy

## 2023-06-05 ENCOUNTER — Telehealth: Payer: Self-pay | Admitting: Physical Therapy

## 2023-06-05 DIAGNOSIS — R2681 Unsteadiness on feet: Secondary | ICD-10-CM | POA: Insufficient documentation

## 2023-06-05 DIAGNOSIS — R42 Dizziness and giddiness: Secondary | ICD-10-CM | POA: Insufficient documentation

## 2023-06-05 NOTE — Telephone Encounter (Signed)
Called pt regarding no show appt today. Pt reports that he thought he cancelled it today when he responded 'No' to the text message reminder. Re-scheduled pt for next Thursday (11/7) at 8:45. Discussed if pt needs to cancel/reschedule, then to call our office.   Sherlie Ban, PT, DPT 06/05/23 9:59 AM   [facility] [address] [place], [place]  [address] Phone:  [phone] Fax:  [phone]

## 2023-06-08 DIAGNOSIS — Z961 Presence of intraocular lens: Secondary | ICD-10-CM | POA: Diagnosis not present

## 2023-06-08 DIAGNOSIS — S0501XS Injury of conjunctiva and corneal abrasion without foreign body, right eye, sequela: Secondary | ICD-10-CM | POA: Diagnosis not present

## 2023-06-08 DIAGNOSIS — H16001 Unspecified corneal ulcer, right eye: Secondary | ICD-10-CM | POA: Diagnosis not present

## 2023-06-08 DIAGNOSIS — S0501XD Injury of conjunctiva and corneal abrasion without foreign body, right eye, subsequent encounter: Secondary | ICD-10-CM | POA: Diagnosis not present

## 2023-06-08 DIAGNOSIS — H30033 Focal chorioretinal inflammation, peripheral, bilateral: Secondary | ICD-10-CM | POA: Diagnosis not present

## 2023-06-11 ENCOUNTER — Ambulatory Visit: Payer: Medicare Other

## 2023-06-11 DIAGNOSIS — R2681 Unsteadiness on feet: Secondary | ICD-10-CM

## 2023-06-11 DIAGNOSIS — R42 Dizziness and giddiness: Secondary | ICD-10-CM

## 2023-06-11 NOTE — Therapy (Addendum)
OUTPATIENT PHYSICAL THERAPY VESTIBULAR TREATMENT     Patient Name: Daniel Hampton MRN: [id number] DOB:1941-06-22, 82 y.o., male Today's Date: 06/11/2023  END OF SESSION:  PT End of Session - 06/11/23 0840     Visit Number 2    Number of Visits 9    Date for PT Re-Evaluation 07/03/23    Authorization Type [employer] medicare    PT Start Time [phone]    PT Stop Time 0930    PT Time Calculation (min) 47 min    Activity Tolerance Patient tolerated treatment well    Behavior During Therapy [facility] for tasks assessed/performed             Past Medical History:  Diagnosis Date   Allergy    SEASONAL   Arthritis    Asthma    COLONIC POLYPS, HX OF 03/12/2007   DISC DISEASE, LUMBAR 12/03/2008   DIVERTICULOSIS, COLON 10/17/2009   History of kidney stones    Hydrocele    HYPERLIPIDEMIA 03/12/2007   Hyphema of right eye    Complicated by allergic conjunctivitis   Melanoma (HCC) 04/23/2020   in situ-Left Upper Arm   MORTON'S NEUROMA, RIGHT 03/13/2008   SCC (squamous cell carcinoma) 04/26/2013   right hand-txpbx   SKIN CANCER, HX OF 10/17/2009   VERTIGO, BENIGN PAROXYSMAL POSITION 04/28/2007   Past Surgical History:  Procedure Laterality Date   BACK SURGERY     COLONOSCOPY W/ BIOPSIES     CT CTA CORONARY W/CA SCORE W/CM &/OR WO/CM  01/27/2022   1. Coronary artery calcium score 1540 Agatston units. This places the patient in the 76th percentile for age and gender, suggesting intermediate risk for future events.  2. Extensive coronary disease but appears nonobstructive. This is confirmed by CT FFR. 3. 4.2 cm dilation of the ascending aorta,   EYE SURGERY Left    LUMBAR LAMINECTOMY/ DECOMPRESSION WITH MET-RX Left 03/01/2021   Procedure: LEFT LUMBAR FOUR-FIVE MINIMALLY INVASIVE LAMINECTOMY, LATERAL RECESS DECOMPRESSION AND RESECTION OF SYNOVIAL CYST;  Surgeon: Bethann Goo, DO;  Location: [facility] OR;  Service: Neurosurgery;  Laterality: Left;   MOHS SURGERY     SPINE SURGERY     lumbar disk  surgery   Patient Active Problem List   Diagnosis Date Noted   Long term (current) use of systemic steroids 04/14/2023   TIA (transient ischemic attack) 04/07/2023   Dizziness 04/07/2023   Atherosclerotic vascular disease 04/07/2023   Statin myopathy 12/31/2022   Hepatitis A antibody positive 05/12/2022   Mature cataract 04/14/2022   Peripheral focal chorioretinal inflammation of both eyes 04/14/2022   Posterior synechiae (iris), 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   Retinal edema 04/14/2022   Chronic allergic conjunctivitis 04/10/2022   Gastro-esophageal reflux disease without esophagitis 04/10/2022   Coronary artery disease, non-occlusive 04/01/2022   Exercise intolerance 01/06/2022   Elevated coronary artery calcium score 12/26/2021   Diabetes (HCC) 08/12/2021   Prediabetes 08/12/2021   Emphysema lung (HCC) 07/02/2021   Lung nodule seen on imaging study 07/02/2021   Chronic cough 07/02/2021   Lumbar radiculopathy L5 s/p L4-5 laminectomy 7/22 02/06/2021   Aortic atherosclerosis (HCC) 01/18/2020   DIVERTICULOSIS, COLON 10/17/2009   H/O melanoma in situ 10/17/2009   DISC DISEASE, LUMBAR 12/03/2008   Morton's neuroma of both feet 03/13/2008   VERTIGO, BENIGN PAROXYSMAL POSITION 04/28/2007   Hyperlipidemia with target LDL less than 70 03/12/2007   History of colonic polyps 03/12/2007    PCP: Cheryll Cockayne, MD REFERRING PROVIDER: Cheryll Cockayne, MD  REFERRING DIAG: [phone] (  ICD-10-CM) - Vertigo  THERAPY DIAG:  Unsteadiness on feet  Dizziness and giddiness  ONSET DATE: 06/01/2023  Rationale for Evaluation and Treatment: Rehabilitation  SUBJECTIVE:   SUBJECTIVE STATEMENT: Patient arrives with clinic with [facility] Is still tipping his head back to put eye drops in. Denies falls. Has had a few instances of dizziness, especially when putting his drops in.  Pt accompanied by: self  PERTINENT HISTORY: arthritis, asthma, skin CA, HLD  PAIN:  Are you having pain? No  PRECAUTIONS: Fall and Other:  R eye vision impaired  PATIENT GOALS: "to get better with this dizziness- to play golf"  OBJECTIVE:  Note: Objective measures were completed at Evaluation unless otherwise noted.  DIAGNOSTIC FINDINGS: 04/07/23 Brain MRI  IMPRESSION: 1. No acute intracranial abnormality. 2. Mild chronic microvascular ischemic disease for age.  VESTIBULAR ASSESSMENT:   POSITIONAL TESTING: Right Dix-Hallpike: upbeating, right nystagmus Left Dix-Hallpike: upbeating, left nystagmus Right Roll Test: possibly indicative of R posterior canal Left Roll Test: possibly indicative of L posterior canal   VESTIBULAR TREATMENT:                                                                                                   Canalith Repositioning:  Epley Right: Number of Reps: 1 and Response to Treatment: symptoms resolved and Epley Left: Number of Reps: 2 and Response to Treatment: symptoms improved  Bed placed into trendelenburg with R DH re-check (resolved) With bed in trendelenburg check L DH and was (+), so proceeded with epley  Re-check L posterior canal, no observable nystagmus, but patient reported spinning and so proceeded with Epley   PATIENT EDUCATION: Education details: exam findings, adjustments to eye drop placement  Person educated: Patient Education method: Medical illustrator Education comprehension: verbalized understanding and needs further education  HOME EXERCISE PROGRAM: To be provided GOALS: Goals reviewed with patient? Yes  SHORT TERM GOALS: = LTG based on PT POC length   LONG TERM GOALS: Target date: 07/03/23  Pt will be independent with final HEP for improved symptom report  Baseline:  Goal status: INITIAL  2.  Patient will demonstrate (-) positional testing to indicate resolution of BPPV  Baseline: L posterior canal canalithiasis  Goal status: INITIAL  3.  MSQ goal  Baseline: to be assessed Goal status: INITIAL  4.  FOTO goal Baseline: to be  completed Goal status: INITIAL  ASSESSMENT:  CLINICAL IMPRESSION: Patient seen for skilled PT session with emphasis on canalith repositioning. Positional testing initially without clear picture of which canal was involved. Roll test bilaterally appeared to be indicated of posterior canal involvement (larger amplitude, torsional, upbeating). When checking dix hallpike bilaterally, results were also indicative of posterior canal involvement, but of lower amplitude when compared to roll test. Proceeded with Epley based on nystagmus presentation. R posterior canal resolved. L posterior canal treated with Epley x2 with resolution of nystagmus, but patient remained with dizziness. Discussed again modifying how he applies his eye drops- patient verbalized understanding. Continue POC.   OBJECTIVE IMPAIRMENTS: dizziness.   ACTIVITY LIMITATIONS: bending, squatting, bed mobility, hygiene/grooming, locomotion level, and caring for  others  PARTICIPATION LIMITATIONS: medication management, interpersonal relationship, driving, shopping, community activity, and yard work  PERSONAL FACTORS: Age, Past/current experiences, Time since onset of injury/illness/exacerbation, and 3+ comorbidities: see above  are also affecting patient's functional outcome.   REHAB POTENTIAL: Good  CLINICAL DECISION MAKING: Evolving/moderate complexity  EVALUATION COMPLEXITY: Moderate   PLAN:  PT FREQUENCY: 2x/week  PT DURATION: 4 weeks  PLANNED INTERVENTIONS: 97164- PT Re-evaluation, 97110-Therapeutic exercises, 97530- Therapeutic activity, 97112- Neuromuscular re-education, 97535- Self Care, 40981- Manual therapy, [phone]- Gait training, [phone]- Orthotic Fit/training, [phone]- Canalith repositioning, [phone]- Aquatic Therapy, Patient/Family education, Balance training, Stair training, Vestibular training, Visual/preceptual remediation/compensation, and DME instructions  PLAN FOR NEXT SESSION: MSQ, FOTO, recheck basically all canals     Westley Foots, PT Westley Foots, PT, DPT, CBIS  06/11/2023, 9:51 AM

## 2023-06-16 ENCOUNTER — Encounter: Payer: Self-pay | Admitting: Physical Therapy

## 2023-06-16 ENCOUNTER — Ambulatory Visit: Payer: Medicare Other | Admitting: Physical Therapy

## 2023-06-16 VITALS — BP 138/72 | HR 55

## 2023-06-16 DIAGNOSIS — R42 Dizziness and giddiness: Secondary | ICD-10-CM

## 2023-06-16 DIAGNOSIS — R2681 Unsteadiness on feet: Secondary | ICD-10-CM

## 2023-06-16 NOTE — Therapy (Signed)
OUTPATIENT PHYSICAL THERAPY VESTIBULAR TREATMENT     Patient Name: Daniel Hampton MRN: [id number] DOB:[birth year], 82 y.o., male Today's Date: 06/16/2023  END OF SESSION:  PT End of Session - 06/16/23 0932     Visit Number 3    Number of Visits 9    Date for PT Re-Evaluation 07/03/23    Authorization Type [employer] medicare    PT Start Time [phone]    PT Stop Time 1013    PT Time Calculation (min) 42 min    Activity Tolerance Patient tolerated treatment well   limited by dizziness   Behavior During Therapy [facility] for tasks assessed/performed             Past Medical History:  Diagnosis Date   Allergy    SEASONAL   Arthritis    Asthma    COLONIC POLYPS, HX OF 03/12/2007   DISC DISEASE, LUMBAR 12/03/2008   DIVERTICULOSIS, COLON 10/17/2009   History of kidney stones    Hydrocele    HYPERLIPIDEMIA 03/12/2007   Hyphema of right eye    Complicated by allergic conjunctivitis   Melanoma (HCC) 04/23/2020   in situ-Left Upper Arm   MORTON'S NEUROMA, RIGHT 03/13/2008   SCC (squamous cell carcinoma) 04/26/2013   right hand-txpbx   SKIN CANCER, HX OF 10/17/2009   VERTIGO, BENIGN PAROXYSMAL POSITION 04/28/2007   Past Surgical History:  Procedure Laterality Date   BACK SURGERY     COLONOSCOPY W/ BIOPSIES     CT CTA CORONARY W/CA SCORE W/CM &/OR WO/CM  01/27/2022   1. Coronary artery calcium score 1540 Agatston units. This places the patient in the 76th percentile for age and gender, suggesting intermediate risk for future events.  2. Extensive coronary disease but appears nonobstructive. This is confirmed by CT FFR. 3. 4.2 cm dilation of the ascending aorta,   EYE SURGERY Left    LUMBAR LAMINECTOMY/ DECOMPRESSION WITH MET-RX Left 03/01/2021   Procedure: LEFT LUMBAR FOUR-FIVE MINIMALLY INVASIVE LAMINECTOMY, LATERAL RECESS DECOMPRESSION AND RESECTION OF SYNOVIAL CYST;  Surgeon: Bethann Goo, DO;  Location: [facility] OR;  Service: Neurosurgery;  Laterality: Left;   MOHS SURGERY     SPINE  SURGERY     lumbar disk surgery   Patient Active Problem List   Diagnosis Date Noted   Long term (current) use of systemic steroids 04/14/2023   TIA (transient ischemic attack) 04/07/2023   Dizziness 04/07/2023   Atherosclerotic vascular disease 04/07/2023   Statin myopathy 12/31/2022   Hepatitis A antibody positive 05/12/2022   Mature cataract 04/14/2022   Peripheral focal chorioretinal inflammation of both eyes 04/14/2022   Posterior synechiae (iris), 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   Retinal edema 04/14/2022   Chronic allergic conjunctivitis 04/10/2022   Gastro-esophageal reflux disease without esophagitis 04/10/2022   Coronary artery disease, non-occlusive 04/01/2022   Exercise intolerance 01/06/2022   Elevated coronary artery calcium score 12/26/2021   Diabetes (HCC) 08/12/2021   Prediabetes 08/12/2021   Emphysema lung (HCC) 07/02/2021   Lung nodule seen on imaging study 07/02/2021   Chronic cough 07/02/2021   Lumbar radiculopathy L5 s/p L4-5 laminectomy 7/22 02/06/2021   Aortic atherosclerosis (HCC) 01/18/2020   DIVERTICULOSIS, COLON 10/17/2009   H/O melanoma in situ 10/17/2009   DISC DISEASE, LUMBAR 12/03/2008   Morton's neuroma of both feet 03/13/2008   VERTIGO, BENIGN PAROXYSMAL POSITION 04/28/2007   Hyperlipidemia with target LDL less than 70 03/12/2007   History of colonic polyps 03/12/2007    PCP: Cheryll Cockayne, MD REFERRING PROVIDER: Cheryll Cockayne, MD  REFERRING DIAG: R42 (ICD-10-CM) - Vertigo  THERAPY DIAG:  Unsteadiness on feet  Dizziness and giddiness  ONSET DATE: 06/01/2023  Rationale for Evaluation and Treatment: Rehabilitation  SUBJECTIVE:   SUBJECTIVE STATEMENT: Notes Friday was not very good, felt pretty dizzy. Took a Meclizine on Friday. Went to the [employer] on Saturday. Reports his wife has been putting his drops in his eyes. Still gets dizzy in his recliner. Putting in eye drops and getting up off the floor will make him dizzy. Looking up will make him  pretty dizzy.   Pt accompanied by: self  PERTINENT HISTORY: arthritis, asthma, skin CA, HLD  PAIN:  Are you having pain? No  Today's Vitals   06/16/23 0941  BP: 138/72  Pulse: (!) 55   There is no height or weight on file to calculate BMI.   PRECAUTIONS: Fall and Other: R eye vision impaired  PATIENT GOALS: "to get better with this dizziness- to play golf"  OBJECTIVE:  Note: Objective measures were completed at Evaluation unless otherwise noted.  DIAGNOSTIC FINDINGS: 04/07/23 Brain MRI  IMPRESSION: 1. No acute intracranial abnormality. 2. Mild chronic microvascular ischemic disease for age.  VESTIBULAR ASSESSMENT:   POSITIONAL TESTING: Right Dix-Hallpike: upbeating, right nystagmus and lasting approx. 8-10 seconds Left Dix-Hallpike: upbeating, left nystagmus and lasting 10-15 seconds, more intense nystagmus and pt feeling more symptomatic   VESTIBULAR TREATMENT:                           4" steps placed under bed for Trendelenberg due to decr cervical extension                                                                          Canalith Repositioning:  Epley Left: Number of Reps: 3 and Response to Treatment: comment: symptoms improved after the first 2 reps and pt also reporting decr dizziness when sitting up from each 3 reps, when re-assessing after the 3rd rep, noted geotropic nystagmus in L DixHallpike position, indicating that it converted to horizontal canal. Immediately sat pt up and pt felt dizzy and needing some time for dizziness to subside.   Pt sat at edge of mat for a while and was able to ambulate out to waiting room with cane and supervision/CGA due to dizziness.  Pt reports doing better and was going to sit in the waiting room for a little bit before driving home. Discussed calling a friend if he needs to get a rid home due to dizziness. Pt verbalized understanding.   PATIENT EDUCATION: Education details: adding a couple more appts, BPPV education and  purpose of repositioning maneuvers, education on how his L posterior canal BPPV converted to horizontal canal (and education about horizontal canal), discussed trying repeating rolling at home before next session to hopefully clear horizontal canal BPPV, can take a Meclizine as needed later today if pt feeling too dizzy.  Person educated: Patient Education method: Medical illustrator Education comprehension: verbalized understanding and needs further education  HOME EXERCISE PROGRAM: To be provided GOALS: Goals reviewed with patient? Yes  SHORT TERM GOALS: = LTG based on PT POC length   LONG TERM GOALS: Target date: 07/03/23  Pt will  be independent with final HEP for improved symptom report  Baseline:  Goal status: INITIAL  2.  Patient will demonstrate (-) positional testing to indicate resolution of BPPV  Baseline: L posterior canal canalithiasis  Goal status: INITIAL  3.  MSQ goal  Baseline: to be assessed Goal status: INITIAL  4.  FOTO goal Baseline: to be completed Goal status: INITIAL  ASSESSMENT:  CLINICAL IMPRESSION: Pt returns to PT for canalith repositioning. Mat table elevated for 4" risers to help assist with extension due to limited cervical AROM. Pt initially having incr duration and more intensity of nystagmus in L DixHallpike compared to R. Completed 3 reps of the Epley maneuver with decr nystagmus and intensity each time and pt also reporting feeling less dizzy. When going to re-assess one last time at end of session in L DixHallpike, pt demonstrating geotropic intense nystagmus, indicating conversion to horizontal canal. Sat pt up and pt felt more dizzy, needing a seated break. Pt able to ambulate out of session with SBA/CGA and was planning on waiting in waiting room until feeling ok to drive home or getting a friend to come pick him up. Educated on trying to perform repeated rolling to hopefully clear horizontal canal BPPV before next session. Will  continue per POC.    OBJECTIVE IMPAIRMENTS: dizziness.   ACTIVITY LIMITATIONS: bending, squatting, bed mobility, hygiene/grooming, locomotion level, and caring for others  PARTICIPATION LIMITATIONS: medication management, interpersonal relationship, driving, shopping, community activity, and yard work  PERSONAL FACTORS: Age, Past/current experiences, Time since onset of injury/illness/exacerbation, and 3+ comorbidities: see above  are also affecting patient's functional outcome.   REHAB POTENTIAL: Good  CLINICAL DECISION MAKING: Evolving/moderate complexity  EVALUATION COMPLEXITY: Moderate   PLAN:  PT FREQUENCY: 2x/week  PT DURATION: 4 weeks  PLANNED INTERVENTIONS: 97164- PT Re-evaluation, 97110-Therapeutic exercises, 97530- Therapeutic activity, 97112- Neuromuscular re-education, 97535- Self Care, 16109- Manual therapy, [phone]- Gait training, [phone]- Orthotic Fit/training, [phone]- Canalith repositioning, [phone]- Aquatic Therapy, Patient/Family education, Balance training, Stair training, Vestibular training, Visual/preceptual remediation/compensation, and DME instructions  PLAN FOR NEXT SESSION: MSQ, FOTO, recheck basically all canals (last time it converted to horizontal canal ugh)   Sherlie Ban, PT, DPT 06/16/23 1:02 PM

## 2023-06-18 ENCOUNTER — Ambulatory Visit: Payer: Medicare Other

## 2023-06-18 VITALS — BP 124/67

## 2023-06-18 DIAGNOSIS — R42 Dizziness and giddiness: Secondary | ICD-10-CM | POA: Diagnosis not present

## 2023-06-18 DIAGNOSIS — R2681 Unsteadiness on feet: Secondary | ICD-10-CM | POA: Diagnosis not present

## 2023-06-18 NOTE — Therapy (Addendum)
OUTPATIENT PHYSICAL THERAPY VESTIBULAR TREATMENT     Patient Name: Daniel Hampton MRN: [id number] DOB:1940-10-28, 82 y.o., male Today's Date: 06/18/2023  END OF SESSION:  PT End of Session - 06/18/23 1445     Visit Number 4    Number of Visits 9    Date for PT Re-Evaluation 07/03/23    Authorization Type [employer] medicare    PT Start Time 1450    PT Stop Time 1530    PT Time Calculation (min) 40 min    Activity Tolerance Patient tolerated treatment well    Behavior During Therapy WFL for tasks assessed/performed             Past Medical History:  Diagnosis Date   Allergy    SEASONAL   Arthritis    Asthma    COLONIC POLYPS, HX OF 03/12/2007   DISC DISEASE, LUMBAR 12/03/2008   DIVERTICULOSIS, COLON 10/17/2009   History of kidney stones    Hydrocele    HYPERLIPIDEMIA 03/12/2007   Hyphema of right eye    Complicated by allergic conjunctivitis   Melanoma (HCC) 04/23/2020   in situ-Left Upper Arm   MORTON'S NEUROMA, RIGHT 03/13/2008   SCC (squamous cell carcinoma) 04/26/2013   right hand-txpbx   SKIN CANCER, HX OF 10/17/2009   VERTIGO, BENIGN PAROXYSMAL POSITION 04/28/2007   Past Surgical History:  Procedure Laterality Date   BACK SURGERY     COLONOSCOPY W/ BIOPSIES     CT CTA CORONARY W/CA SCORE W/CM &/OR WO/CM  01/27/2022   1. Coronary artery calcium score 1540 Agatston units. This places the patient in the 76th percentile for age and gender, suggesting intermediate risk for future events.  2. Extensive coronary disease but appears nonobstructive. This is confirmed by CT FFR. 3. 4.2 cm dilation of the ascending aorta,   EYE SURGERY Left    LUMBAR LAMINECTOMY/ DECOMPRESSION WITH MET-RX Left 03/01/2021   Procedure: LEFT LUMBAR FOUR-FIVE MINIMALLY INVASIVE LAMINECTOMY, LATERAL RECESS DECOMPRESSION AND RESECTION OF SYNOVIAL CYST;  Surgeon: Bethann Goo, DO;  Location: [facility] OR;  Service: Neurosurgery;  Laterality: Left;   MOHS SURGERY     SPINE SURGERY     lumbar disk  surgery   Patient Active Problem List   Diagnosis Date Noted   Long term (current) use of systemic steroids 04/14/2023   TIA (transient ischemic attack) 04/07/2023   Dizziness 04/07/2023   Atherosclerotic vascular disease 04/07/2023   Statin myopathy 12/31/2022   Hepatitis A antibody positive 05/12/2022   Mature cataract 04/14/2022   Peripheral focal chorioretinal inflammation of both eyes 04/14/2022   Posterior synechiae (iris), 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   Retinal edema 04/14/2022   Chronic allergic conjunctivitis 04/10/2022   Gastro-esophageal reflux disease without esophagitis 04/10/2022   Coronary artery disease, non-occlusive 04/01/2022   Exercise intolerance 01/06/2022   Elevated coronary artery calcium score 12/26/2021   Diabetes (HCC) 08/12/2021   Prediabetes 08/12/2021   Emphysema lung (HCC) 07/02/2021   Lung nodule seen on imaging study 07/02/2021   Chronic cough 07/02/2021   Lumbar radiculopathy L5 s/p L4-5 laminectomy 7/22 02/06/2021   Aortic atherosclerosis (HCC) 01/18/2020   DIVERTICULOSIS, COLON 10/17/2009   H/O melanoma in situ 10/17/2009   DISC DISEASE, LUMBAR 12/03/2008   Morton's neuroma of both feet 03/13/2008   VERTIGO, BENIGN PAROXYSMAL POSITION 04/28/2007   Hyperlipidemia with target LDL less than 70 03/12/2007   History of colonic polyps 03/12/2007    PCP: Cheryll Cockayne, MD REFERRING PROVIDER: Cheryll Cockayne, MD  REFERRING DIAG: [phone] (  ICD-10-CM) - Vertigo  THERAPY DIAG:  Unsteadiness on feet  Dizziness and giddiness  ONSET DATE: 06/01/2023  Rationale for Evaluation and Treatment: Rehabilitation  SUBJECTIVE:   SUBJECTIVE STATEMENT: Dizziness after last appt "was pretty wild." He has not done any repetitive rolling. Did take Meclizine yesterday. Denies falls.   Pt accompanied by: self  PERTINENT HISTORY: arthritis, asthma, skin CA, HLD  PAIN:  Are you having pain? No  Today's Vitals   06/18/23 1454  BP: 124/67    There is no height or  weight on file to calculate BMI.   PRECAUTIONS: Fall and Other: R eye vision impaired  PATIENT GOALS: "to get better with this dizziness- to play golf"  OBJECTIVE:  Note: Objective measures were completed at Evaluation unless otherwise noted.  DIAGNOSTIC FINDINGS: 04/07/23 Brain MRI  IMPRESSION: 1. No acute intracranial abnormality. 2. Mild chronic microvascular ischemic disease for age.  VESTIBULAR ASSESSMENT:   POSITIONAL TESTING: Right Dix-Hallpike: upbeating, right nystagmus Left Dix-Hallpike: no nystagmus Right Roll Test: no nystagmus Left Roll Test: no nystagmus  VESTIBULAR TREATMENT:                          R epley x1 Upon re-check of R DH, demonstrating R apogeotropic horizontal nystagmus  Rechecked roll test:   -R (+) geotropic nystagmus, worse on R, possibly indicative of R horizontal canalithiasis  Rolled R and demonstrated strong apogeotropic nystagmus Rolled L and demonstrated weaker apogeotropic nystagmus  Rolled R again patient demonstrated R upbeating torsional nystagmus  Re-tested R DH and patient did not demonstrate any nystagmus at all   PATIENT EDUCATION: Education details: repeated rolling Person educated: Patient Education method: Medical illustrator Education comprehension: verbalized understanding and needs further education  HOME EXERCISE PROGRAM: To be provided GOALS: Goals reviewed with patient? Yes  SHORT TERM GOALS: = LTG based on PT POC length   LONG TERM GOALS: Target date: 07/03/23  Pt will be independent with final HEP for improved symptom report  Baseline:  Goal status: INITIAL  2.  Patient will demonstrate (-) positional testing to indicate resolution of BPPV  Baseline: L posterior canal canalithiasis  Goal status: INITIAL  3.  MSQ goal  Baseline: to be assessed Goal status: INITIAL  4.  FOTO goal Baseline: to be completed Goal status: INITIAL  ASSESSMENT:  CLINICAL IMPRESSION: Patient seen for skilled PT  session with emphasis on canalith repositioning. His positional testing was rather inconclusive today, indicating possible multicanal involvement (?). PT instructed patient to complete repetitive rolling. This should help if the otoliths are in the horizontal canal, but if they are positioned in the posterior canal- this should have no ill impact. Continue POC.    OBJECTIVE IMPAIRMENTS: dizziness.   ACTIVITY LIMITATIONS: bending, squatting, bed mobility, hygiene/grooming, locomotion level, and caring for others  PARTICIPATION LIMITATIONS: medication management, interpersonal relationship, driving, shopping, community activity, and yard work  PERSONAL FACTORS: Age, Past/current experiences, Time since onset of injury/illness/exacerbation, and 3+ comorbidities: see above  are also affecting patient's functional outcome.   REHAB POTENTIAL: Good  CLINICAL DECISION MAKING: Evolving/moderate complexity  EVALUATION COMPLEXITY: Moderate   PLAN:  PT FREQUENCY: 2x/week  PT DURATION: 4 weeks  PLANNED INTERVENTIONS: 97164- PT Re-evaluation, 97110-Therapeutic exercises, 97530- Therapeutic activity, [id number]- Neuromuscular re-education, 97535- Self Care, 08657- Manual therapy, [phone]- Gait training, [phone]- Orthotic Fit/training, [phone]- Canalith repositioning, [phone]- Aquatic Therapy, Patient/Family education, Balance training, Stair training, Vestibular training, Visual/preceptual remediation/compensation, and DME instructions  PLAN FOR NEXT SESSION:  MSQ, FOTO, recheck basically all canals (I have no idea where those otoliths are)    Westley Foots, PT, DPT, CBIS 06/18/23 4:00 PM

## 2023-06-22 ENCOUNTER — Ambulatory Visit: Payer: Medicare Other | Admitting: Physical Therapy

## 2023-06-22 ENCOUNTER — Encounter: Payer: Self-pay | Admitting: Physical Therapy

## 2023-06-22 DIAGNOSIS — R2681 Unsteadiness on feet: Secondary | ICD-10-CM

## 2023-06-22 DIAGNOSIS — R42 Dizziness and giddiness: Secondary | ICD-10-CM

## 2023-06-22 NOTE — Therapy (Signed)
OUTPATIENT PHYSICAL THERAPY VESTIBULAR TREATMENT     Patient Name: Daniel Hampton MRN: [id number] DOB:08/30/1940, 82 y.o., male Today's Date: 06/22/2023  END OF SESSION:  PT End of Session - 06/22/23 0934     Visit Number 5    Number of Visits 9    Date for PT Re-Evaluation 07/03/23    Authorization Type [employer] medicare    PT Start Time [phone]    PT Stop Time 1015    PT Time Calculation (min) 42 min    Activity Tolerance Patient tolerated treatment well    Behavior During Therapy WFL for tasks assessed/performed             Past Medical History:  Diagnosis Date   Allergy    SEASONAL   Arthritis    Asthma    COLONIC POLYPS, HX OF 03/12/2007   DISC DISEASE, LUMBAR 12/03/2008   DIVERTICULOSIS, COLON 10/17/2009   History of kidney stones    Hydrocele    HYPERLIPIDEMIA 03/12/2007   Hyphema of right eye    Complicated by allergic conjunctivitis   Melanoma (HCC) 04/23/2020   in situ-Left Upper Arm   MORTON'S NEUROMA, RIGHT 03/13/2008   SCC (squamous cell carcinoma) 04/26/2013   right hand-txpbx   SKIN CANCER, HX OF 10/17/2009   VERTIGO, BENIGN PAROXYSMAL POSITION 04/28/2007   Past Surgical History:  Procedure Laterality Date   BACK SURGERY     COLONOSCOPY W/ BIOPSIES     CT CTA CORONARY W/CA SCORE W/CM &/OR WO/CM  01/27/2022   1. Coronary artery calcium score 1540 Agatston units. This places the patient in the 76th percentile for age and gender, suggesting intermediate risk for future events.  2. Extensive coronary disease but appears nonobstructive. This is confirmed by CT FFR. 3. 4.2 cm dilation of the ascending aorta,   EYE SURGERY Left    LUMBAR LAMINECTOMY/ DECOMPRESSION WITH MET-RX Left 03/01/2021   Procedure: LEFT LUMBAR FOUR-FIVE MINIMALLY INVASIVE LAMINECTOMY, LATERAL RECESS DECOMPRESSION AND RESECTION OF SYNOVIAL CYST;  Surgeon: Bethann Goo, DO;  Location: [facility] OR;  Service: Neurosurgery;  Laterality: Left;   MOHS SURGERY     SPINE SURGERY     lumbar disk  surgery   Patient Active Problem List   Diagnosis Date Noted   Long term (current) use of systemic steroids 04/14/2023   TIA (transient ischemic attack) 04/07/2023   Dizziness 04/07/2023   Atherosclerotic vascular disease 04/07/2023   Statin myopathy 12/31/2022   Hepatitis A antibody positive 05/12/2022   Mature cataract 04/14/2022   Peripheral focal chorioretinal inflammation of both eyes 04/14/2022   Posterior synechiae (iris), 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   Retinal edema 04/14/2022   Chronic allergic conjunctivitis 04/10/2022   Gastro-esophageal reflux disease without esophagitis 04/10/2022   Coronary artery disease, non-occlusive 04/01/2022   Exercise intolerance 01/06/2022   Elevated coronary artery calcium score 12/26/2021   Diabetes (HCC) 08/12/2021   Prediabetes 08/12/2021   Emphysema lung (HCC) 07/02/2021   Lung nodule seen on imaging study 07/02/2021   Chronic cough 07/02/2021   Lumbar radiculopathy L5 s/p L4-5 laminectomy 7/22 02/06/2021   Aortic atherosclerosis (HCC) 01/18/2020   DIVERTICULOSIS, COLON 10/17/2009   H/O melanoma in situ 10/17/2009   DISC DISEASE, LUMBAR 12/03/2008   Morton's neuroma of both feet 03/13/2008   VERTIGO, BENIGN PAROXYSMAL POSITION 04/28/2007   Hyperlipidemia with target LDL less than 70 03/12/2007   History of colonic polyps 03/12/2007    PCP: Cheryll Cockayne, MD REFERRING PROVIDER: Cheryll Cockayne, MD  REFERRING DIAG: [phone] (  ICD-10-CM) - Vertigo  THERAPY DIAG:  Unsteadiness on feet  Dizziness and giddiness  ONSET DATE: 06/01/2023  Rationale for Evaluation and Treatment: Rehabilitation  SUBJECTIVE:   SUBJECTIVE STATEMENT: Hit some golf balls on Saturday. Took a Meclizine on Friday night. Still not 100%. Reports was doing stuff in the yard on Sunday and couldn't even walk. Took 2 Meclizine and then finally started feeling better. Notes that it will make him feel pretty sleepy and drowsy. Tried the rolling at home, but was not as dizzy. Did it  in the bed and the floor several times. Was afraid to do it yesterday. Might not be here the rest of the week, [place].  Pt accompanied by: self  PERTINENT HISTORY: arthritis, asthma, skin CA, HLD  PAIN:  Are you having pain? No  There were no vitals filed for this visit.   There is no height or weight on file to calculate BMI.   PRECAUTIONS: Fall and Other: R eye vision impaired  PATIENT GOALS: "to get better with this dizziness- to play golf"  OBJECTIVE:  Note: Objective measures were completed at Evaluation unless otherwise noted.  DIAGNOSTIC FINDINGS: 04/07/23 Brain MRI  IMPRESSION: 1. No acute intracranial abnormality. 2. Mild chronic microvascular ischemic disease for age.  VESTIBULAR TREATMENT:     Had 4" risers under bed to incr extension due to limited cervical mobility      POSITIONAL TESTING: Right Dix-Hallpike: upbeating, right nystagmus and lasting 8-10 seconds Left Dix-Hallpike: upbeating, left nystagmus and more mild nystagmus, only lasting approx. 5 seconds    Canalith Repositioning: Epley Right: Number of Reps: 1, Response to Treatment: symptoms worsened/converted, and Comment: with re-assessment, pt demonstrating intense geotropic nystagmus, indicating conversion to horizontal canal   L roll test: No nystagmus R roll test: Strong, intense, geotropic nystagmus lasting approx. 20 seconds , indicating R horizontal canalithiasis   Canalith Repositioning:  Appiani/Gufoni Horizontal Geotropic: Comment: to R side   Re-assessed R and L roll test after R Gufoni with pt having no nystagmus, indicating resolution of R horizontal canalithiasis    PATIENT EDUCATION: Education details: BPPV education, resolution of horizontal canal BPPV, scheduling a couple more appts  Person educated: Patient Education method: Explanation, Demonstration, and Handouts Education comprehension: verbalized understanding and needs further  education  HOME EXERCISE PROGRAM: To be provided  GOALS: Goals reviewed with patient? Yes  SHORT TERM GOALS: = LTG based on PT POC length   LONG TERM GOALS: Target date: 07/03/23  Pt will be independent with final HEP for improved symptom report  Baseline:  Goal status: INITIAL  2.  Patient will demonstrate (-) positional testing to indicate resolution of BPPV  Baseline: L posterior canal canalithiasis  Goal status: INITIAL  3.  MSQ goal  Baseline: to be assessed Goal status: INITIAL  4.  FOTO goal Baseline: to be completed Goal status: INITIAL  ASSESSMENT:  CLINICAL IMPRESSION: Patient seen for skilled PT session with emphasis on canalith repositioning. At start of session, pt with R and L posterior canal BPPV, with incr symptoms and nystagmus to the R side. Treated x1 rep of the Epley maneuver with pt having mild dizziness and nystagmus throughout maneuver. When re-assessed, pt demonstrating strong and intense R geotropic nystagmus, indicating conversion to R horizontal canal. Treated x1 rep of the Gufoni maneuver, with pt demonstrating resolution of R horizontal canal BPPV afterwards. Did not have time to re-assess other canals at end of the session. Pt reporting  dizziness at end of the session, but was able to ambulate out of clinic with [facility] with supervision/CGA. Pt was planning to sit in waiting room to wait for things to settle down before driving home. Will continue per POC.     OBJECTIVE IMPAIRMENTS: dizziness.   ACTIVITY LIMITATIONS: bending, squatting, bed mobility, hygiene/grooming, locomotion level, and caring for others  PARTICIPATION LIMITATIONS: medication management, interpersonal relationship, driving, shopping, community activity, and yard work  PERSONAL FACTORS: Age, Past/current experiences, Time since onset of injury/illness/exacerbation, and 3+ comorbidities: see above  are also affecting patient's functional outcome.   REHAB POTENTIAL:  Good  CLINICAL DECISION MAKING: Evolving/moderate complexity  EVALUATION COMPLEXITY: Moderate   PLAN:  PT FREQUENCY: 2x/week  PT DURATION: 4 weeks  PLANNED INTERVENTIONS: 97164- PT Re-evaluation, 97110-Therapeutic exercises, 97530- Therapeutic activity, 97112- Neuromuscular re-education, 97535- Self Care, 62952- Manual therapy, [phone]- Gait training, [phone]- Orthotic Fit/training, [phone]- Canalith repositioning, [phone]- Aquatic Therapy, Patient/Family education, Balance training, Stair training, Vestibular training, Visual/preceptual remediation/compensation, and DME instructions  PLAN FOR NEXT SESSION: MSQ, FOTO, recheck basically all canals (I have no idea where those otoliths are). May benefit to try Semont due to how easily pt's crystals can convert   Sherlie Ban, PT, DPT 06/22/23 10:28 AM

## 2023-06-26 ENCOUNTER — Other Ambulatory Visit: Payer: Self-pay | Admitting: Cardiology

## 2023-06-26 DIAGNOSIS — E785 Hyperlipidemia, unspecified: Secondary | ICD-10-CM

## 2023-06-26 DIAGNOSIS — I2510 Atherosclerotic heart disease of native coronary artery without angina pectoris: Secondary | ICD-10-CM

## 2023-06-29 ENCOUNTER — Ambulatory Visit: Payer: Medicare Other | Admitting: Physical Therapy

## 2023-06-29 ENCOUNTER — Encounter: Payer: Self-pay | Admitting: Physical Therapy

## 2023-06-29 DIAGNOSIS — R42 Dizziness and giddiness: Secondary | ICD-10-CM

## 2023-06-29 DIAGNOSIS — R2681 Unsteadiness on feet: Secondary | ICD-10-CM

## 2023-06-29 NOTE — Therapy (Signed)
OUTPATIENT PHYSICAL THERAPY VESTIBULAR TREATMENT     Patient Name: Daniel Hampton MRN: [id number] DOB:Apr 10, 1941, 82 y.o., male Today's Date: 06/29/2023  END OF SESSION:  PT End of Session - 06/29/23 0931     Visit Number 6    Number of Visits 9    Date for PT Re-Evaluation 07/03/23    Authorization Type [employer] medicare    PT Start Time [phone]    PT Stop Time 1006    PT Time Calculation (min) 37 min    Activity Tolerance Patient tolerated treatment well    Behavior During Therapy WFL for tasks assessed/performed             Past Medical History:  Diagnosis Date   Allergy    SEASONAL   Arthritis    Asthma    COLONIC POLYPS, HX OF 03/12/2007   DISC DISEASE, LUMBAR 12/03/2008   DIVERTICULOSIS, COLON 10/17/2009   History of kidney stones    Hydrocele    HYPERLIPIDEMIA 03/12/2007   Hyphema of right eye    Complicated by allergic conjunctivitis   Melanoma (HCC) 04/23/2020   in situ-Left Upper Arm   MORTON'S NEUROMA, RIGHT 03/13/2008   SCC (squamous cell carcinoma) 04/26/2013   right hand-txpbx   SKIN CANCER, HX OF 10/17/2009   VERTIGO, BENIGN PAROXYSMAL POSITION 04/28/2007   Past Surgical History:  Procedure Laterality Date   BACK SURGERY     COLONOSCOPY W/ BIOPSIES     CT CTA CORONARY W/CA SCORE W/CM &/OR WO/CM  01/27/2022   1. Coronary artery calcium score 1540 Agatston units. This places the patient in the 76th percentile for age and gender, suggesting intermediate risk for future events.  2. Extensive coronary disease but appears nonobstructive. This is confirmed by CT FFR. 3. 4.2 cm dilation of the ascending aorta,   EYE SURGERY Left    LUMBAR LAMINECTOMY/ DECOMPRESSION WITH MET-RX Left 03/01/2021   Procedure: LEFT LUMBAR FOUR-FIVE MINIMALLY INVASIVE LAMINECTOMY, LATERAL RECESS DECOMPRESSION AND RESECTION OF SYNOVIAL CYST;  Surgeon: Bethann Goo, DO;  Location: [facility] OR;  Service: Neurosurgery;  Laterality: Left;   MOHS SURGERY     SPINE SURGERY     lumbar disk  surgery   Patient Active Problem List   Diagnosis Date Noted   Long term (current) use of systemic steroids 04/14/2023   TIA (transient ischemic attack) 04/07/2023   Dizziness 04/07/2023   Atherosclerotic vascular disease 04/07/2023   Statin myopathy 12/31/2022   Hepatitis A antibody positive 05/12/2022   Mature cataract 04/14/2022   Peripheral focal chorioretinal inflammation of both eyes 04/14/2022   Posterior synechiae (iris), 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   Retinal edema 04/14/2022   Chronic allergic conjunctivitis 04/10/2022   Gastro-esophageal reflux disease without esophagitis 04/10/2022   Coronary artery disease, non-occlusive 04/01/2022   Exercise intolerance 01/06/2022   Elevated coronary artery calcium score 12/26/2021   Diabetes (HCC) 08/12/2021   Prediabetes 08/12/2021   Emphysema lung (HCC) 07/02/2021   Lung nodule seen on imaging study 07/02/2021   Chronic cough 07/02/2021   Lumbar radiculopathy L5 s/p L4-5 laminectomy 7/22 02/06/2021   Aortic atherosclerosis (HCC) 01/18/2020   DIVERTICULOSIS, COLON 10/17/2009   H/O melanoma in situ 10/17/2009   DISC DISEASE, LUMBAR 12/03/2008   Morton's neuroma of both feet 03/13/2008   VERTIGO, BENIGN PAROXYSMAL POSITION 04/28/2007   Hyperlipidemia with target LDL less than 70 03/12/2007   History of colonic polyps 03/12/2007    PCP: Cheryll Cockayne, MD REFERRING PROVIDER: Cheryll Cockayne, MD  REFERRING DIAG: [phone] (  ICD-10-CM) - Vertigo  THERAPY DIAG:  Unsteadiness on feet  Dizziness and giddiness  ONSET DATE: 06/01/2023  Rationale for Evaluation and Treatment: Rehabilitation  SUBJECTIVE:   SUBJECTIVE STATEMENT: Feels like the dizziness has gotten better. Had a good trip to the beach. Took a Meclizine early yesterday when going to blow leaves, just took it as a precaution.   Pt accompanied by: self  PERTINENT HISTORY: arthritis, asthma, skin CA, HLD  PAIN:  Are you having pain? No  There were no vitals filed for this  visit.   There is no height or weight on file to calculate BMI.   PRECAUTIONS: Fall and Other: R eye vision impaired  PATIENT GOALS: "to get better with this dizziness- to play golf"  OBJECTIVE:  Note: Objective measures were completed at Evaluation unless otherwise noted.  DIAGNOSTIC FINDINGS: 04/07/23 Brain MRI  IMPRESSION: 1. No acute intracranial abnormality. 2. Mild chronic microvascular ischemic disease for age.  VESTIBULAR TREATMENT:     POSITIONAL TESTING: Right Sidelying: upbeating, right nystagmus and lasting approx. 6-8 seconds, pt more symptomatic when coming upright  Left Sidelying: upbeating, left nystagmus and lasting approx 6-8 seconds, pt with some dizziness coming up but not as much as R   Canalith Repositioning: Semont Right Posterior: Number of Reps: 2, Response to Treatment: symptoms resolved, and Comment: 2nd rep utilized vibration on mastoid in each position. PT student behind pt's trunk to help speed of maneuver   Re-assessed R sidelying position 2 reps afterwards with pt having very mild dizziness but no nystagmus. Assessed L sidelying with no nystagmus/dizziness in position, just some dizziness when coming to sit upright  Habituation: Brandt-Daroff: comment: 2 reps each side, pt with no nystagmus in each position, but some dizziness in R sidelying. Pt with dizziness with return to upright. Provided to HEP      PATIENT EDUCATION: Education details: Getting scheduled an extra appt, BPPV education and current resolution, Austin Miles exercises for HEP  Person educated: Patient Education method: Programmer, multimedia, Demonstration, Verbal cues, and Handouts Education comprehension: verbalized understanding, returned demonstration, and needs further education  HOME EXERCISE PROGRAM: Access Code: [email or web address] URL: [email or web address] Date: 06/29/2023 Prepared by: Sherlie Ban  Exercises - Brandt-Daroff Vestibular Exercise  - 1-2 x daily - 5 x  weekly - 4-5 reps  GOALS: Goals reviewed with patient? Yes  SHORT TERM GOALS: = LTG based on PT POC length   LONG TERM GOALS: Target date: 07/03/23  Pt will be independent with final HEP for improved symptom report  Baseline:  Goal status: INITIAL  2.  Patient will demonstrate (-) positional testing to indicate resolution of BPPV  Baseline: L posterior canal canalithiasis  Goal status: INITIAL  3.  MSQ goal  Baseline: to be assessed Goal status: INITIAL  4.  FOTO goal Baseline: to be completed Goal status: INITIAL  ASSESSMENT:  CLINICAL IMPRESSION:  Re-assessed positional testing at start of session with pt with continued with R and L posterior canalithiasis. Pt's nystagmus did not last as long and was not as intense as previous sessions. Pt had more symptoms when coming up from R side, so treated R side first with Semont maneuver (did not do Epley due to pt converting to horizontal canal in previous sessions). During 2nd rep of Semont, utilized mastoid vibration in each position. With re-assessment, pt demonstrating resolution of R and L posterior canal BPPV. Pt still with some dizziness with return to upright, so gave pt Austin Miles exercises for HEP. Will continue per POC.  OBJECTIVE IMPAIRMENTS: dizziness.   ACTIVITY LIMITATIONS: bending, squatting, bed mobility, hygiene/grooming, locomotion level, and caring for others  PARTICIPATION LIMITATIONS: medication management, interpersonal relationship, driving, shopping, community activity, and yard work  PERSONAL FACTORS: Age, Past/current experiences, Time since onset of injury/illness/exacerbation, and 3+ comorbidities: see above  are also affecting patient's functional outcome.   REHAB POTENTIAL: Good  CLINICAL DECISION MAKING: Evolving/moderate complexity  EVALUATION COMPLEXITY: Moderate   PLAN:  PT FREQUENCY: 2x/week  PT DURATION: 4 weeks  PLANNED INTERVENTIONS: 97164- PT Re-evaluation,  97110-Therapeutic exercises, 97530- Therapeutic activity, 97112- Neuromuscular re-education, 97535- Self Care, 07371- Manual therapy, [phone]- Gait training, [phone]- Orthotic Fit/training, [phone]- Canalith repositioning, [phone]- Aquatic Therapy, Patient/Family education, Balance training, Stair training, Vestibular training, Visual/preceptual remediation/compensation, and DME instructions  PLAN FOR NEXT SESSION: check goals, will need re-cert? Re-check canals, pt was able to be cleared with semont and vibration. How were brandt daroff exercises for home?   Sherlie Ban, PT, DPT 06/29/23 10:11 AM

## 2023-07-01 ENCOUNTER — Ambulatory Visit: Payer: Medicare Other

## 2023-07-01 DIAGNOSIS — R2681 Unsteadiness on feet: Secondary | ICD-10-CM | POA: Diagnosis not present

## 2023-07-01 DIAGNOSIS — R42 Dizziness and giddiness: Secondary | ICD-10-CM | POA: Diagnosis not present

## 2023-07-01 NOTE — Therapy (Addendum)
OUTPATIENT PHYSICAL THERAPY VESTIBULAR TREATMENT/ RE-CERT     Patient Name: Daniel Hampton MRN: [id number] DOB:Aug 09, 1940, 82 y.o., male Today's Date: 07/01/2023  END OF SESSION:  PT End of Session - 07/01/23 0844     Visit Number 7    Number of Visits 25   re-cert   Date for PT Re-Evaluation 40/98/11   re-cert   Authorization Type [employer] medicare    PT Start Time 0844    PT Stop Time 0929    PT Time Calculation (min) 45 min    Activity Tolerance Patient tolerated treatment well             Past Medical History:  Diagnosis Date   Allergy    SEASONAL   Arthritis    Asthma    COLONIC POLYPS, HX OF 03/12/2007   DISC DISEASE, LUMBAR 12/03/2008   DIVERTICULOSIS, COLON 10/17/2009   History of kidney stones    Hydrocele    HYPERLIPIDEMIA 03/12/2007   Hyphema of right eye    Complicated by allergic conjunctivitis   Melanoma (HCC) 04/23/2020   in situ-Left Upper Arm   MORTON'S NEUROMA, RIGHT 03/13/2008   SCC (squamous cell carcinoma) 04/26/2013   right hand-txpbx   SKIN CANCER, HX OF 10/17/2009   VERTIGO, BENIGN PAROXYSMAL POSITION 04/28/2007   Past Surgical History:  Procedure Laterality Date   BACK SURGERY     COLONOSCOPY W/ BIOPSIES     CT CTA CORONARY W/CA SCORE W/CM &/OR WO/CM  01/27/2022   1. Coronary artery calcium score 1540 Agatston units. This places the patient in the 76th percentile for age and gender, suggesting intermediate risk for future events.  2. Extensive coronary disease but appears nonobstructive. This is confirmed by CT FFR. 3. 4.2 cm dilation of the ascending aorta,   EYE SURGERY Left    LUMBAR LAMINECTOMY/ DECOMPRESSION WITH MET-RX Left 03/01/2021   Procedure: LEFT LUMBAR FOUR-FIVE MINIMALLY INVASIVE LAMINECTOMY, LATERAL RECESS DECOMPRESSION AND RESECTION OF SYNOVIAL CYST;  Surgeon: Bethann Goo, DO;  Location: [facility] OR;  Service: Neurosurgery;  Laterality: Left;   MOHS SURGERY     SPINE SURGERY     lumbar disk surgery   Patient Active Problem  List   Diagnosis Date Noted   Long term (current) use of systemic steroids 04/14/2023   TIA (transient ischemic attack) 04/07/2023   Dizziness 04/07/2023   Atherosclerotic vascular disease 04/07/2023   Statin myopathy 12/31/2022   Hepatitis A antibody positive 05/12/2022   Mature cataract 04/14/2022   Peripheral focal chorioretinal inflammation of both eyes 04/14/2022   Posterior synechiae (iris), 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   Retinal edema 04/14/2022   Chronic allergic conjunctivitis 04/10/2022   Gastro-esophageal reflux disease without esophagitis 04/10/2022   Coronary artery disease, non-occlusive 04/01/2022   Exercise intolerance 01/06/2022   Elevated coronary artery calcium score 12/26/2021   Diabetes (HCC) 08/12/2021   Prediabetes 08/12/2021   Emphysema lung (HCC) 07/02/2021   Lung nodule seen on imaging study 07/02/2021   Chronic cough 07/02/2021   Lumbar radiculopathy L5 s/p L4-5 laminectomy 7/22 02/06/2021   Aortic atherosclerosis (HCC) 01/18/2020   DIVERTICULOSIS, COLON 10/17/2009   H/O melanoma in situ 10/17/2009   DISC DISEASE, LUMBAR 12/03/2008   Morton's neuroma of both feet 03/13/2008   VERTIGO, BENIGN PAROXYSMAL POSITION 04/28/2007   Hyperlipidemia with target LDL less than 70 03/12/2007   History of colonic polyps 03/12/2007    PCP: Cheryll Cockayne, MD REFERRING PROVIDER: Cheryll Cockayne, MD  REFERRING DIAG: R42 (ICD-10-CM) - Vertigo  THERAPY  DIAG:  Unsteadiness on feet  Dizziness and giddiness  ONSET DATE: 06/01/2023  Rationale for Evaluation and Treatment: Rehabilitation  SUBJECTIVE:   SUBJECTIVE STATEMENT: Patient has been doing well. Last took Meclozine after session on Monday. Has been compliant with HEP.   Pt accompanied by: self  PERTINENT HISTORY: arthritis, asthma, skin CA, HLD  PAIN:  Are you having pain? No  There were no vitals filed for this visit.   There is no height or weight on file to calculate BMI.   PRECAUTIONS: Fall and Other: R  eye vision impaired  PATIENT GOALS: "to get better with this dizziness- to play golf"  OBJECTIVE:  Note: Objective measures were completed at Evaluation unless otherwise noted.  DIAGNOSTIC FINDINGS: 04/07/23 Brain MRI  IMPRESSION: 1. No acute intracranial abnormality. 2. Mild chronic microvascular ischemic disease for age.  VESTIBULAR TREATMENT:     POSITIONAL TESTING: Right Sidelying: upbeating, right nystagmus  Canalith Repositioning: Semont Right Posterior: Number of Reps: 5 and Comment: 4 w/o vibration, 1 w/ vibration   Clear R sidelying at end of session   Education on [employer] Daroff HEP   PATIENT EDUCATION: Education details: Getting scheduled an extra appt, BPPV education and current resolution, Animal nutritionist exercises for [employer]  Person educated: Patient Education method: Programmer, multimedia, Facilities manager, Verbal cues, and Handouts Education comprehension: verbalized understanding, returned demonstration, and needs further education  HOME EXERCISE PROGRAM: Access Code: [email or web address] URL: [email or web address] Date: 06/29/2023 Prepared by: Sherlie Ban  Exercises - Brandt-Daroff Vestibular Exercise  - 1-2 x daily - 5 x weekly - 4-5 reps  GOALS: Goals reviewed with patient? Yes  SHORT TERM GOALS: = LTG based on PT POC length   LONG TERM GOALS: Target date: 07/03/23  Pt will be independent with final HEP for improved symptom report  Baseline: provided Goal status: MET  2.  Patient will demonstrate (-) positional testing to indicate resolution of BPPV  Baseline: L posterior canal canalithiasis, R posterior  Goal status: IN PROGRESS  3.  MSQ goal Baseline: to be assessed Goal status: DISCONTINUED  4.  FOTO goal Baseline: to be completed Goal status: DISCONTINUED    NEW Long Term Goals: 08/28/23  Pt will be independent with final HEP for improved symptom report  Baseline: 07/01/23: Corwin Levins Daroff  Goal status: IN PROGRESS   2.  Patient will  demonstrate (-) positional testing to indicate resolution of BPPV Baseline: R posterior canal  Goal status: IN PROGRESS  3.  MSQ goal Baseline: to be assessed Goal status: INITIAL    ASSESSMENT:  CLINICAL IMPRESSION: Patient seen for skilled physical therapy session today with emphasis on re-assessing positional testing and treating as needed. Performed R sidelying positional test which was positive for R posterior canalithiasis. Nystagmus not as severe as it had been in previous sessions, lasting less than 5 seconds each time. Treated with semont maneuver until getting a clear R sidelying test by the end of session. Educated on doing R brandt daroff only at home. Continue POC.    OBJECTIVE IMPAIRMENTS: dizziness.   ACTIVITY LIMITATIONS: bending, squatting, bed mobility, hygiene/grooming, locomotion level, and caring for others  PARTICIPATION LIMITATIONS: medication management, interpersonal relationship, driving, shopping, community activity, and yard work  PERSONAL FACTORS: Age, Past/current experiences, Time since onset of injury/illness/exacerbation, and 3+ comorbidities: see above  are also affecting patient's functional outcome.   REHAB POTENTIAL: Good  CLINICAL DECISION MAKING: Evolving/moderate complexity  EVALUATION COMPLEXITY: Moderate   PLAN:  PT FREQUENCY: 2x/week 2x a week  PT DURATION:  4 weeks 8 weeks  PLANNED INTERVENTIONS: 97164- PT Re-evaluation, 97110-Therapeutic exercises, 97530- Therapeutic activity, [id number]- Neuromuscular re-education, 97535- Self Care, 16109- Manual therapy, [phone]- Gait training, [phone]- Orthotic Fit/training, [phone]- Canalith repositioning, [phone]- Aquatic Therapy, Patient/Family education, Balance training, Stair training, Vestibular training, Visual/preceptual remediation/compensation, and DME instructions  PLAN FOR NEXT SESSION: re-check R sidelying, check L sidelying?    Shown Dissinger M Rozlyn Yerby, SPT  Westley Foots, PT, DPT, CBIS  07/01/23  10:10 AM

## 2023-07-01 NOTE — Addendum Note (Signed)
Addended by: Merry Lofty A on: 07/01/2023 10:11 AM   Modules accepted: Orders

## 2023-07-09 ENCOUNTER — Ambulatory Visit
Admission: RE | Admit: 2023-07-09 | Discharge: 2023-07-09 | Disposition: A | Discharge status: Home / Self Care | Payer: Medicare Other | Source: Ambulatory Visit | Attending: Adult Health | Admitting: Adult Health

## 2023-07-09 DIAGNOSIS — I7121 Aneurysm of the ascending aorta, without rupture: Secondary | ICD-10-CM | POA: Diagnosis not present

## 2023-07-09 DIAGNOSIS — R918 Other nonspecific abnormal finding of lung field: Secondary | ICD-10-CM

## 2023-07-21 ENCOUNTER — Other Ambulatory Visit: Payer: Self-pay | Admitting: Adult Health

## 2023-07-21 DIAGNOSIS — R918 Other nonspecific abnormal finding of lung field: Secondary | ICD-10-CM

## 2023-08-04 NOTE — Progress Notes (Signed)
 Subjective:    Patient ID: Daniel Hampton, male    DOB: 03-28-1941, 82 y.o.   MRN: [id number]     HPI Daniel Hampton is here for follow up of his chronic medical problems.  Saw him 9/10 for vertigo - had gone to ED.  MRI neg.  Nondrowsy dramamine helped.  Referred to PT.  Did 7 sessions of PT.  Nystagmus improved.  Has learned the exercises to do - has had occasional vertigo.      Doing well - has not been walking as much with the holidays but still walking.    Medications and allergies reviewed with patient and updated if appropriate.  Current Outpatient Medications on File Prior to Visit  Medication Sig Dispense Refill   Coenzyme Q10 (COQ-10 PO) Take 1 tablet by mouth daily.     Evolocumab  (REPATHA  SURECLICK) 140 MG/ML SOAJ INJECT 1 ML (140 MG) into the skin every 14 days 6 mL 1   famotidine  (PEPCID ) 20 MG tablet Take 20 mg by mouth 2 (two) times daily.     Multiple Vitamins-Minerals (ALIVE MULTI-VITAMIN PO) Take 1 tablet by mouth daily.     mycophenolate  (CELLCEPT ) 250 MG capsule Take 750 mg by mouth 2 (two) times daily.     prednisoLONE  acetate (PRED FORTE ) 1 % ophthalmic suspension Place 1 drop into the right eye daily.     valACYclovir  (VALTREX ) 1000 MG tablet Take by mouth.     meclizine  (ANTIVERT ) 25 MG tablet Take 0.5-1 tablets (12.5-25 mg total) by mouth 3 (three) times daily as needed for dizziness. 30 tablet 2   No current facility-administered medications on file prior to visit.     Review of Systems  Constitutional:  Negative for fever.  Respiratory:  Negative for cough, shortness of breath and wheezing.   Cardiovascular:  Negative for chest pain, palpitations and leg swelling.  Neurological:  Negative for light-headedness and headaches.       Objective:   Vitals:   08/10/23 1442  BP: (!) 120/58  Pulse: 65  Temp: 98 F (36.7 C)  SpO2: 94%   BP Readings from Last 3 Encounters:  08/10/23 (!) 120/58  06/18/23 124/67  06/16/23 138/72   Wt Readings from Last 3  Encounters:  08/10/23 231 lb (104.8 kg)  04/22/23 229 lb 6.4 oz (104.1 kg)  04/14/23 226 lb 12.8 oz (102.9 kg)   Body mass index is 33.15 kg/m.    Physical Exam Constitutional:      General: He is not in acute distress.    Appearance: Normal appearance. He is not ill-appearing.  HENT:     Head: Normocephalic and atraumatic.  Eyes:     Conjunctiva/sclera: Conjunctivae normal.  Cardiovascular:     Rate and Rhythm: Normal rate and regular rhythm.     Heart sounds: Normal heart sounds.  Pulmonary:     Effort: Pulmonary effort is normal. No respiratory distress.     Breath sounds: Normal breath sounds. No wheezing or rales.  Musculoskeletal:     Right lower leg: No edema.     Left lower leg: No edema.  Skin:    General: Skin is warm and dry.     Findings: No rash.  Neurological:     Mental Status: He is alert. Mental status is at baseline.  Psychiatric:        Mood and Affect: Mood normal.        Lab Results  Component Value Date   WBC 9.0  04/07/2023   HGB 15.9 04/07/2023   HCT 47.1 04/07/2023   PLT 230 04/07/2023   GLUCOSE 97 04/07/2023   CHOL 95 02/16/2023   TRIG 133.0 02/16/2023   HDL 47.20 02/16/2023   LDLDIRECT 88.0 02/03/2022   LDLCALC 22 02/16/2023   ALT 21 02/16/2023   AST 25 02/16/2023   NA 139 04/07/2023   K 4.3 04/07/2023   CL 104 04/07/2023   CREATININE 0.96 04/07/2023   BUN 17 04/07/2023   CO2 26 04/07/2023   TSH 2.80 01/18/2020   PSA 5.31 08/02/2018   HGBA1C 6.1 12/31/2022   MICROALBUR 0.7 12/31/2022     Assessment & Plan:    See Problem List for Assessment and Plan of chronic medical problems.

## 2023-08-10 ENCOUNTER — Ambulatory Visit (INDEPENDENT_AMBULATORY_CARE_PROVIDER_SITE_OTHER): Payer: Medicare Other | Admitting: Internal Medicine

## 2023-08-10 ENCOUNTER — Encounter: Payer: Self-pay | Admitting: Internal Medicine

## 2023-08-10 VITALS — BP 120/58 | HR 65 | Temp 98.0°F | Ht 70.0 in | Wt 231.0 lb

## 2023-08-10 DIAGNOSIS — E785 Hyperlipidemia, unspecified: Secondary | ICD-10-CM

## 2023-08-10 DIAGNOSIS — E1169 Type 2 diabetes mellitus with other specified complication: Secondary | ICD-10-CM | POA: Diagnosis not present

## 2023-08-10 DIAGNOSIS — Z23 Encounter for immunization: Secondary | ICD-10-CM | POA: Diagnosis not present

## 2023-08-10 DIAGNOSIS — H8110 Benign paroxysmal vertigo, unspecified ear: Secondary | ICD-10-CM | POA: Diagnosis not present

## 2023-08-10 DIAGNOSIS — G720 Drug-induced myopathy: Secondary | ICD-10-CM

## 2023-08-10 DIAGNOSIS — R7303 Prediabetes: Secondary | ICD-10-CM

## 2023-08-10 DIAGNOSIS — I2510 Atherosclerotic heart disease of native coronary artery without angina pectoris: Secondary | ICD-10-CM | POA: Diagnosis not present

## 2023-08-10 DIAGNOSIS — T466X5A Adverse effect of antihyperlipidemic and antiarteriosclerotic drugs, initial encounter: Secondary | ICD-10-CM

## 2023-08-10 LAB — CBC WITH DIFFERENTIAL/PLATELET
Basophils Absolute: 0.1 10*3/uL (ref 0.0–0.1)
Basophils Relative: 0.7 % (ref 0.0–3.0)
Eosinophils Absolute: 0.2 10*3/uL (ref 0.0–0.7)
Eosinophils Relative: 2 % (ref 0.0–5.0)
HCT: 46.2 % (ref 39.0–52.0)
Hemoglobin: 15.9 g/dL (ref 13.0–17.0)
Lymphocytes Relative: 36.1 % (ref 12.0–46.0)
Lymphs Abs: 3 10*3/uL (ref 0.7–4.0)
MCHC: 34.3 g/dL (ref 30.0–36.0)
MCV: 99.2 fL (ref 78.0–100.0)
Monocytes Absolute: 0.6 10*3/uL (ref 0.1–1.0)
Monocytes Relative: 7.5 % (ref 3.0–12.0)
Neutro Abs: 4.5 10*3/uL (ref 1.4–7.7)
Neutrophils Relative %: 53.7 % (ref 43.0–77.0)
Platelets: 213 10*3/uL (ref 150.0–400.0)
RBC: 4.66 Mil/uL (ref 4.22–5.81)
RDW: 12.8 % (ref 11.5–15.5)
WBC: 8.4 10*3/uL (ref 4.0–10.5)

## 2023-08-10 LAB — COMPREHENSIVE METABOLIC PANEL
ALT: 26 U/L (ref 0–53)
AST: 28 U/L (ref 0–37)
Albumin: 4.7 g/dL (ref 3.5–5.2)
Alkaline Phosphatase: 53 U/L (ref 39–117)
BUN: 19 mg/dL (ref 6–23)
CO2: 30 meq/L (ref 19–32)
Calcium: 9.5 mg/dL (ref 8.4–10.5)
Chloride: 103 meq/L (ref 96–112)
Creatinine, Ser: 0.96 mg/dL (ref 0.40–1.50)
GFR: 73.05 mL/min (ref >60.00)
Glucose, Bld: 89 mg/dL (ref 70–99)
Potassium: 4.3 meq/L (ref 3.5–5.1)
Sodium: 140 meq/L (ref 135–145)
Total Bilirubin: 0.5 mg/dL (ref 0.2–1.2)
Total Protein: 7.4 g/dL (ref 6.0–8.3)

## 2023-08-10 LAB — HEMOGLOBIN A1C: Hgb A1c MFr Bld: 6.1 % (ref 4.6–6.5)

## 2023-08-10 LAB — LIPID PANEL
Cholesterol: 139 mg/dL (ref 0–200)
HDL: 46.5 mg/dL (ref >39.00)
LDL Cholesterol: 31 mg/dL (ref 0–99)
NonHDL: 92.16
Total CHOL/HDL Ratio: 3
Triglycerides: 304 mg/dL — ABNORMAL HIGH (ref 0.0–149.0)
VLDL: 60.8 mg/dL — ABNORMAL HIGH (ref 0.0–40.0)

## 2023-08-10 NOTE — Assessment & Plan Note (Addendum)
 Chronic Intolerant of low doses of simvastatin and atorvastatin On repatha

## 2023-08-10 NOTE — Assessment & Plan Note (Signed)
 Chronic  Lab Results  Component Value Date   HGBA1C 6.1 12/31/2022   Sugars controlled Currently on lifestyle control Stressed regular exercise, diabetic diet

## 2023-08-10 NOTE — Assessment & Plan Note (Signed)
 Chronic Check lipid panel, cmp Continue Repatha 140 mg q. 14 days Not as regular with exercise with the holidays but still walking healthy diet encouraged

## 2023-08-10 NOTE — Assessment & Plan Note (Addendum)
 Chronic Intermittent BPPV Did PT  Has mild intermittent vertigo Knows exercises to do at home prn  - still has mild vertigo at times Continue meclizine prn

## 2023-08-10 NOTE — Patient Instructions (Addendum)
    Flu immunization administered today.      Blood work was ordered.       Medications changes include :   None     Return in about 6 months (around 02/07/2024) for Physical Exam.

## 2023-08-11 NOTE — Addendum Note (Signed)
 Addended by: Karma Ganja on: 08/11/2023 07:47 AM   Modules accepted: Orders

## 2023-08-12 ENCOUNTER — Encounter: Payer: Self-pay | Admitting: Internal Medicine

## 2023-08-13 ENCOUNTER — Ambulatory Visit: Payer: Medicare Other | Admitting: Internal Medicine

## 2023-08-13 ENCOUNTER — Encounter: Payer: Self-pay | Admitting: Internal Medicine

## 2023-08-13 VITALS — BP 147/80 | HR 67 | Ht 70.0 in | Wt 232.8 lb

## 2023-08-13 DIAGNOSIS — Z87891 Personal history of nicotine dependence: Secondary | ICD-10-CM

## 2023-08-13 DIAGNOSIS — J439 Emphysema, unspecified: Secondary | ICD-10-CM | POA: Diagnosis not present

## 2023-08-13 DIAGNOSIS — R918 Other nonspecific abnormal finding of lung field: Secondary | ICD-10-CM

## 2023-08-13 NOTE — Progress Notes (Signed)
 OV 05/16/2021  Subjective:  Patient ID: Daniel Hampton, male , DOB: 1941/05/04 , age 83 y.o. , MRN: [id number] , ADDRESS: [address] [place] [address] PCP Daniel Hampton Patient Care Team: Daniel Hampton as PCP - General (Internal Medicine) Daniel Ozell BIRCH, DO as Consulting Physician (Sports Medicine) Daniel Cough, Hampton as Consulting Physician (Urology) Daniel Smock, Lamar BROCKS, Hampton as Consulting Physician (Allergy and Immunology)  This Provider for this visit: Treatment Team:  Attending Provider: Geronimo Amel, Hampton    05/16/2021 -   Chief Complaint  Patient presents with   Consult    Pt had a cxr performed which is part of the reason for the visit. States that he has a chronic Hampton which he says is worse in the afternoons and at night.     HPI Daniel Hampton 83 y.o. - new consult evaluation for abnormal cxr.  He used to work in [employer].  His father died from COPD.  He has a first cousin also died from COPD.  He smoked 20 pack smoking history but quit 40 years ago.  The 1980s he had a back surgery but recently has been having for neuropathy.  He then saw Dr. Tonita Blanch.  Then he had back surgery end of July 2022.  Somewhere along the way he had a chest x-ray 04/15/2021.  Shows emphysematous changes.  Therefore he is concerned about this and came to see me.  Personally visualized the film.  He tells me that at baseline he used to walk 5 miles a day but recently only walking 2 miles a day nonstop.  He is limited by his neuropathy.  There is only some limitation with shortness of breath but he does have some shortness of breath.  More importantly his chronic Hampton for the last 5 years.  He says everyone who knows him knows that he carries mints with him.  Recently allergist given Pepcid  a month ago and this helped control the Hampton.  The Hampton is always improved by a minute and recently by Pepcid  its a dry Hampton.  Symptoms are significantly in the daytime  but some at night.  No shortness of breath of significant proportion with the Hampton but just mild with heavy exertion.  No wheezing at all.  No orthopnea proximal nocturnal dyspnea.  He does have significant visceral obesity.  Recent lab work shown below.      Chemistry 02/26/21  Results for Daniel Hampton (MRN [id number]) as of 05/16/2021 09:44  Ref. Range 02/26/2021 08:53  Creatinine Latest Ref Range: 0.61 - 1.24 mg/dL 9.09  Results for Daniel Hampton (MRN [id number]) as of 05/16/2021 09:44  Ref. Range 02/26/2021 08:53  Hemoglobin Latest Ref Range: 13.0 - 17.0 g/dL 84.4   Narrative & Impression  CLINICAL DATA:  83 year old male with Hampton   EXAM: CHEST - 2 VIEW   COMPARISON:  09/29/2019   FINDINGS: Cardiomediastinal silhouette unchanged in size and contour. No evidence of central vascular congestion. No interlobular septal thickening.   Stigmata of emphysema, with increased retrosternal airspace, flattened hemidiaphragms, increased AP diameter, and hyperinflation on the AP view.   Coarsened interstitial markings with no confluent airspace disease.   No pneumothorax or pleural effusion.   No acute displaced fracture. Degenerative changes of the spine.   IMPRESSION: Emphysematous changes with no acute cardiopulmonary disease     Electronically Signed   By: Daniel Hampton D.O.  On: 04/15/2021 14:33         07/17/2022 Follow up : Chronic Hampton and Abnormal CT chest    Patient returns for a 1 year follow-up.  Patient has underlying chronic Hampton that improved substantially with treatment aimed at GERD.  Patient says he has no significant Hampton currently.  He is taking Pepcid  at bedtime and seems to control it very well.  Previous PFTs were normal. Patient did have COVID-19 infection in October.  Said it took him a few weeks to get over.  He does complain he has had a very rough year with severe right eye infection secondary to prolonged use  of a contact.  Has been following extensively with ophthalmology and diagnosed with uveitis.  Was started on azanthroprine but had a drug reaction and acute pancreatitis required hospitalization.  This was discontinued. CT chest that showed previous lung nodules.  Serial CT done on July 15, 2022 showed stable groundglass nodule in the right upper lobe unchanged since 2022 measuring 8 mm, groundglass nodule in the left upper lobe similar size.  And other small nodules stable.  Mild emphysema.  We discussed his CT results in detail. Patient is under some stress his wife has ovarian cancer and is undergoing treatment.   OV 08/13/2023  Subjective:  Patient ID: Daniel Hampton, male , DOB: 1940-12-14 , age 83 y.o. , MRN: [id number] , ADDRESS: [address] [place] [address] PCP Daniel Hampton Patient Care Team: Daniel Hampton as PCP - General (Internal Medicine) Daniel Alm LELON, Hampton as PCP - Cardiology (Cardiology) Daniel Ozell BIRCH, DO as Consulting Physician (Sports Medicine) Daniel Cough, Hampton as Consulting Physician (Urology) Daniel Smock, Lamar BROCKS, Hampton as Consulting Physician (Allergy and Immunology) Daniel Rigg, Hampton (Inactive) as Consulting Physician (Dermatology)  This Provider for this visit: Treatment Team:  Attending Provider: Geronimo Amel, Hampton    08/13/2023 -   Chief Complaint  Patient presents with   Follow-up    Pt states he has been stable, he is physically active. He denies any concerns      HPI Daniel Hampton 83 y.o. -returns for annual follow-up.  I personally saw him as a consult 2 years ago and then last day he saw nurse practitioner Daniel Hampton.  He said she gave him albuterol  but he has not used it.  He exercises walking 2 miles a day.  He also plays golf 2 times a week.  Does not get any shortness of breath.  No wheezing no Hampton no exacerbations no flareups.  No chest pain no orthopnea no paroxysmal nocturnal dyspnea no edema.  He is very  worried about lung cancer.  He did have a CT scan of the chest in December 2024 showed subcentimeter nodules.  Mild subclinical emphysema.  He wants to continue getting annual CT scans.  He did recall his friend Daniel Hampton 875 died at [facility] from lung cancer.  Multiple family members have had lung cancer.    IMPRESSION:  Lungs/Pleura: Mild emphysematous changes are again seen in the lung apices. 7 mm inferior right upper lobe ground-glass nodule image 5/84 appears unchanged from prior. Faint ground-glass nodular density in the left upper lobe measuring 7 mm image 5/55 is unchanged from prior. No new pulmonary nodules are seen. Lungs are otherwise clear. There is no pleural effusion or pneumothorax.     1. Stable bilateral ground-glass nodules measuring up to 7 mm compared to 05/23/2021. Continued follow-up recommended every 2 years until  5 years of stability has been established. 2. Stable 4.1 cm ascending aortic aneurysm. Recommend annual imaging follow-up.   Aortic Atherosclerosis (ICD10-I70.0) and Emphysema (ICD10-J43.9).     Electronically Signed   By: Greig Pique M.D.   On: 07/20/2023 00:13  PFT     Latest Ref Rng & Units 07/02/2021   10:55 AM  ILD indicators  FVC-Pre L 3.80   FVC-Predicted Pre % 104   FVC-Post L 3.68   FVC-Predicted Post % 101   TLC L 8.65   TLC Predicted % 122   DLCO uncorrected ml/min/mmHg 28.53   DLCO UNC %Pred % 117   DLCO Corrected ml/min/mmHg 28.53   DLCO COR %Pred % 117       LAB RESULTS last 96 hours No results found.  LAB RESULTS last 90 days Recent Results (from the past 2160 hours)  Lipid panel     Status: Abnormal   Collection Time: 08/10/23  3:34 PM  Result Value Ref Range   Cholesterol 139 0 - 200 mg/dL    Comment: ATP III Classification       Desirable:  < 200 mg/dL               Borderline High:  200 - 239 mg/dL          High:  > = 759 mg/dL   Triglycerides 695.9 (H) 0.0 - 149.0 mg/dL    Comment: Normal:  <849  mg/dLBorderline High:  150 - 199 mg/dL   HDL 53.49 >60.99 mg/dL   VLDL 39.1 (H) 0.0 - 59.9 mg/dL   LDL Cholesterol 31 0 - 99 mg/dL   Total CHOL/HDL Ratio 3     Comment:                Men          Women1/2 Average Risk     3.4          3.3Average Risk          5.0          4.42X Average Risk          9.6          7.13X Average Risk          15.0          11.0                       NonHDL 92.16     Comment: NOTE:  Non-HDL goal should be 30 mg/dL higher than patient's LDL goal (i.e. LDL goal of < 70 mg/dL, would have non-HDL goal of < 100 mg/dL)  Hemoglobin J8r     Status: None   Collection Time: 08/10/23  3:34 PM  Result Value Ref Range   Hgb A1c MFr Bld 6.1 4.6 - 6.5 %    Comment: Glycemic Control Guidelines for People with Diabetes:Non Diabetic:  <6%Goal of Therapy: <7%Additional Action Suggested:  >8%   Comprehensive metabolic panel     Status: None   Collection Time: 08/10/23  3:34 PM  Result Value Ref Range   Sodium 140 135 - 145 mEq/L   Potassium 4.3 3.5 - 5.1 mEq/L   Chloride 103 96 - 112 mEq/L   CO2 30 19 - 32 mEq/L   Glucose, Bld 89 70 - 99 mg/dL   BUN 19 6 - 23 mg/dL   Creatinine, Ser 9.03 0.40 - 1.50 mg/dL   Total Bilirubin 0.5 0.2 - 1.2  mg/dL   Alkaline Phosphatase 53 39 - 117 U/L   AST 28 0 - 37 U/L   ALT 26 0 - 53 U/L   Total Protein 7.4 6.0 - 8.3 g/dL   Albumin 4.7 3.5 - 5.2 g/dL   GFR 26.94 >39.99 mL/min    Comment: Calculated using the CKD-EPI Creatinine Equation (2021)   Calcium  9.5 8.4 - 10.5 mg/dL  CBC with Differential/Platelet     Status: None   Collection Time: 08/10/23  3:34 PM  Result Value Ref Range   WBC 8.4 4.0 - 10.5 K/uL   RBC 4.66 4.22 - 5.81 Mil/uL   Hemoglobin 15.9 13.0 - 17.0 g/dL   HCT 53.7 60.9 - 47.9 %   MCV 99.2 78.0 - 100.0 fl   MCHC 34.3 30.0 - 36.0 g/dL   RDW 87.1 88.4 - 84.4 %   Platelets 213.0 150.0 - 400.0 K/uL   Neutrophils Relative % 53.7 43.0 - 77.0 %   Lymphocytes Relative 36.1 12.0 - 46.0 %   Monocytes Relative 7.5 3.0 -  12.0 %   Eosinophils Relative 2.0 0.0 - 5.0 %   Basophils Relative 0.7 0.0 - 3.0 %   Neutro Abs 4.5 1.4 - 7.7 K/uL   Lymphs Abs 3.0 0.7 - 4.0 K/uL   Monocytes Absolute 0.6 0.1 - 1.0 K/uL   Eosinophils Absolute 0.2 0.0 - 0.7 K/uL   Basophils Absolute 0.1 0.0 - 0.1 K/uL         has a past medical history of Allergy, Arthritis, Asthma, COLONIC POLYPS, HX OF (03/12/2007), DISC DISEASE, LUMBAR (12/03/2008), DIVERTICULOSIS, COLON (10/17/2009), History of kidney stones, Hydrocele, HYPERLIPIDEMIA (03/12/2007), Hyphema of right eye, Melanoma (HCC) (04/23/2020), MORTON'S NEUROMA, RIGHT (03/13/2008), SCC (squamous cell carcinoma) (04/26/2013), SKIN CANCER, HX OF (10/17/2009), and VERTIGO, BENIGN PAROXYSMAL POSITION (04/28/2007).   reports that he quit smoking about 44 years ago. His smoking use included cigarettes. He started smoking about 64 years ago. He has a 20 pack-year smoking history. He has never used smokeless tobacco.  Past Surgical History:  Procedure Laterality Date   BACK SURGERY     COLONOSCOPY W/ BIOPSIES     CT CTA CORONARY W/CA SCORE W/CM &/OR WO/CM  01/27/2022   1. Coronary artery calcium  score 1540 Agatston units. This places the patient in the 76th percentile for age and gender, suggesting intermediate risk for future events.  2. Extensive coronary disease but appears nonobstructive. This is confirmed by CT FFR. 3. 4.2 cm dilation of the ascending aorta,   EYE SURGERY Left    LUMBAR LAMINECTOMY/ DECOMPRESSION WITH MET-RX Left 03/01/2021   Procedure: LEFT LUMBAR FOUR-FIVE MINIMALLY INVASIVE LAMINECTOMY, LATERAL RECESS DECOMPRESSION AND RESECTION OF SYNOVIAL CYST;  Surgeon: Carollee Lani BROCKS, DO;  Location: [facility] OR;  Service: Neurosurgery;  Laterality: Left;   MOHS SURGERY     SPINE SURGERY     lumbar disk surgery    Allergies  Allergen Reactions   Atorvastatin  Other (See Comments)    myalgia   Crestor  [Rosuvastatin ] Other (See Comments)    Too high of a dose makes his muscles  ache   Imuran  [Azathioprine ] Other (See Comments)    pancreatitis   Tape Rash    Immunization History  Administered Date(s) Administered   Fluad Quad(high Dose 65+) 05/10/2019, 06/01/2021, 05/13/2022   Fluad Trivalent(High Dose 65+) 08/10/2023   Influenza Split 04/14/2012, 05/31/2013   Influenza Whole 08/04/2005, 07/19/2007   Influenza, High Dose Seasonal PF 06/05/2015, 07/01/2016, 05/12/2018, 05/23/2020   Influenza,inj,Quad PF,6+ Mos 06/06/2017  Influenza-Unspecified 05/04/2014, 06/05/2015   PFIZER(Purple Top)SARS-COV-2 Vaccination 08/26/2019, 09/13/2019, 06/05/2020   Pneumococcal Conjugate-13 08/08/2014   Pneumococcal Polysaccharide-23 08/04/2004, 12/31/2010, 09/29/2019   Td 08/04/2006   Tdap 08/26/2021   Zoster, Live 04/04/2012    Family History  Problem Relation Age of Onset   Lung cancer Mother    Ovarian cancer Mother    COPD Father    Heart attack Father    Alcohol abuse Brother    Lung cancer Brother        smoker   Breast cancer Daughter    Breast cancer Daughter    Colon cancer Neg Hx    Esophageal cancer Neg Hx    Rectal cancer Neg Hx    Stomach cancer Neg Hx      Current Outpatient Medications:    Coenzyme Q10 (COQ-10 PO), Take 1 tablet by mouth daily., Disp: , Rfl:    Evolocumab  (REPATHA  SURECLICK) 140 MG/ML SOAJ, INJECT 1 ML (140 MG) into the skin every 14 days, Disp: 6 mL, Rfl: 1   famotidine  (PEPCID ) 20 MG tablet, Take 20 mg by mouth 2 (two) times daily., Disp: , Rfl:    meclizine  (ANTIVERT ) 25 MG tablet, Take 0.5-1 tablets (12.5-25 mg total) by mouth 3 (three) times daily as needed for dizziness., Disp: 30 tablet, Rfl: 2   Multiple Vitamins-Minerals (ALIVE MULTI-VITAMIN PO), Take 1 tablet by mouth daily., Disp: , Rfl:    mycophenolate  (CELLCEPT ) 250 MG capsule, Take 750 mg by mouth 2 (two) times daily., Disp: , Rfl:    prednisoLONE  acetate (PRED FORTE ) 1 % ophthalmic suspension, Place 1 drop into the right eye daily., Disp: , Rfl:    valACYclovir   (VALTREX ) 1000 MG tablet, Take by mouth., Disp: , Rfl:       Objective:   Vitals:   08/13/23 1413  BP: (!) 147/80  Pulse: 67  SpO2: 96%  Weight: 232 lb 12.8 oz (105.6 kg)  Height: 5' 10 (1.778 m)    Estimated body mass index is 33.4 kg/m as calculated from the following:   Height as of this encounter: 5' 10 (1.778 m).   Weight as of this encounter: 232 lb 12.8 oz (105.6 kg).  @WEIGHTCHANGE @  [employer]   08/13/23 1413  Weight: 232 lb 12.8 oz (105.6 kg)     Physical Exam   General: No distress. Looks well O2 at rest: no Cane present: no Sitting in wheel chair: no Frail: no Obese: no Neuro: Alert and Oriented x 3. GCS 15. Speech normal Psych: Pleasant Resp:  Barrel Chest - no.  Wheeze - no, Crackles - no, No overt respiratory distress CVS: Normal heart sounds. Murmurs - no Ext: Stigmata of Connective Tissue Disease - no HEENT: Normal upper airway. PEERL +. No post nasal drip        Assessment:       ICD-10-CM   1. Lung nodules  R91.8 CT Chest Wo Contrast    2. Quit smoking  Z87.891 CT Chest Wo Contrast    3. Pulmonary emphysema, unspecified emphysema type (HCC)  J43.9 CT Chest Wo Contrast         Plan:     Patient Instructions     ICD-10-CM   1. Lung nodules  R91.8     2. Quit smoking  Z87.891     3. Pulmonary emphysema, unspecified emphysema type (HCC)  J43.9       Doing well and stable  PLAN  - CT chest in 1 year to follow lung  nodules  - keep exercise  - be uptodate with vaccines  FOllowup  - return in 1 year or sooner if needed   FOLLOWUP Return for 15 min visit, with Dr Daniel after CT chest.    SIGNATURE    Dr. Dorethia Daniel, M.D., F.C.C.P,  Pulmonary and Critical Care Medicine Staff Physician, [facility] Director - Interstitial Lung Disease  Program  Pulmonary Fibrosis [facility] at [facility] [place], [place], [address]  Pager: [phone], If no answer or between   15:00h - 7:00h: call [phone] Telephone: [phone]  2:51 PM 08/13/2023

## 2023-08-13 NOTE — Patient Instructions (Addendum)
 ICD-10-CM   1. Lung nodules  R91.8     2. Quit smoking  Z87.891     3. Pulmonary emphysema, unspecified emphysema type (HCC)  J43.9       Doing well and stable  PLAN  - CT chest in 1 year to follow lung nodules  - keep exercise  - be uptodate with vaccines  FOllowup  - return in 1 year or sooner if needed

## 2023-08-21 DIAGNOSIS — H30033 Focal chorioretinal inflammation, peripheral, bilateral: Secondary | ICD-10-CM | POA: Diagnosis not present

## 2023-08-21 DIAGNOSIS — H3581 Retinal edema: Secondary | ICD-10-CM | POA: Diagnosis not present

## 2023-08-21 DIAGNOSIS — Z961 Presence of intraocular lens: Secondary | ICD-10-CM | POA: Diagnosis not present

## 2023-08-21 DIAGNOSIS — H21541 Posterior synechiae (iris), right eye: Secondary | ICD-10-CM | POA: Diagnosis not present

## 2023-08-21 DIAGNOSIS — Z79899 Other long term (current) drug therapy: Secondary | ICD-10-CM | POA: Diagnosis not present

## 2023-09-09 DIAGNOSIS — R972 Elevated prostate specific antigen [PSA]: Secondary | ICD-10-CM | POA: Diagnosis not present

## 2023-09-16 DIAGNOSIS — N401 Enlarged prostate with lower urinary tract symptoms: Secondary | ICD-10-CM | POA: Diagnosis not present

## 2023-09-16 DIAGNOSIS — R350 Frequency of micturition: Secondary | ICD-10-CM | POA: Diagnosis not present

## 2023-09-16 DIAGNOSIS — R972 Elevated prostate specific antigen [PSA]: Secondary | ICD-10-CM | POA: Diagnosis not present

## 2023-09-17 ENCOUNTER — Telehealth: Payer: Self-pay | Admitting: Cardiology

## 2023-09-17 NOTE — Telephone Encounter (Signed)
Called pt he states he was prescribed Repatha and the cost continues to increase. Pt states "Thayer Ohm spoke with insurance and the cost was reasonable." If they can not get the price lower, they can reorder Crestor."

## 2023-09-17 NOTE — Telephone Encounter (Signed)
Received a message via pt schedule stating:   "Was checking on repatha .started cost was manageable  Cost in nov was 371.0 which is very expensive  Cost in feb is 671.0  When we started the pharmacy guy was involved . If he can't help with cost then  I will start with 25 mg crestor again  Please give me some direction I can't talk to no one live  [phone]"  Please advise.

## 2023-09-22 ENCOUNTER — Other Ambulatory Visit (HOSPITAL_COMMUNITY): Payer: Self-pay

## 2023-09-22 ENCOUNTER — Other Ambulatory Visit: Payer: Self-pay | Admitting: Urology

## 2023-09-22 DIAGNOSIS — R972 Elevated prostate specific antigen [PSA]: Secondary | ICD-10-CM

## 2023-09-23 ENCOUNTER — Other Ambulatory Visit (HOSPITAL_COMMUNITY): Payer: Self-pay

## 2023-09-23 MED FILL — Evolocumab Subcutaneous Soln Auto-Injector 140 MG/ML: SUBCUTANEOUS | 84 days supply | Qty: 6 | Fill #0 | Status: AC

## 2023-10-01 ENCOUNTER — Other Ambulatory Visit (HOSPITAL_COMMUNITY): Payer: Self-pay

## 2023-10-02 ENCOUNTER — Other Ambulatory Visit (HOSPITAL_COMMUNITY): Payer: Self-pay

## 2023-10-12 ENCOUNTER — Other Ambulatory Visit (HOSPITAL_COMMUNITY): Payer: Self-pay

## 2023-10-12 ENCOUNTER — Telehealth: Payer: Self-pay | Admitting: Internal Medicine

## 2023-10-12 DIAGNOSIS — H21541 Posterior synechiae (iris), right eye: Secondary | ICD-10-CM | POA: Diagnosis not present

## 2023-10-12 DIAGNOSIS — R42 Dizziness and giddiness: Secondary | ICD-10-CM

## 2023-10-12 DIAGNOSIS — Z79899 Other long term (current) drug therapy: Secondary | ICD-10-CM | POA: Diagnosis not present

## 2023-10-12 DIAGNOSIS — H4051X0 Glaucoma secondary to other eye disorders, right eye, stage unspecified: Secondary | ICD-10-CM | POA: Diagnosis not present

## 2023-10-12 DIAGNOSIS — S0501XS Injury of conjunctiva and corneal abrasion without foreign body, right eye, sequela: Secondary | ICD-10-CM | POA: Diagnosis not present

## 2023-10-12 DIAGNOSIS — H30033 Focal chorioretinal inflammation, peripheral, bilateral: Secondary | ICD-10-CM | POA: Diagnosis not present

## 2023-10-12 DIAGNOSIS — Z961 Presence of intraocular lens: Secondary | ICD-10-CM | POA: Diagnosis not present

## 2023-10-12 DIAGNOSIS — H3581 Retinal edema: Secondary | ICD-10-CM | POA: Diagnosis not present

## 2023-10-12 DIAGNOSIS — H16001 Unspecified corneal ulcer, right eye: Secondary | ICD-10-CM | POA: Diagnosis not present

## 2023-10-12 MED ORDER — TIMOLOL MALEATE 0.5 % OP SOLN
OPHTHALMIC | 11 refills | Status: DC
  Filled 2023-10-12: qty 10, 50d supply, fill #0
  Filled 2023-12-10: qty 10, 50d supply, fill #1
  Filled 2024-01-30: qty 10, 50d supply, fill #2

## 2023-10-12 NOTE — Telephone Encounter (Signed)
 Copied from CRM [id number]. Topic: Referral - Question >> Oct 12, 2023 12:26 PM Daniel Hampton wrote: Reason for CRM: Patient states that when he went to the eye doctor today, they suggested that his dizziness and vertigo could likely be due to something with the inside of hi ears and is asking Dr. Lawerance Bach to send him a MyChart message of who she recommends for an ENT and if she will write that referral.

## 2023-10-13 ENCOUNTER — Other Ambulatory Visit (HOSPITAL_COMMUNITY): Payer: Self-pay

## 2023-10-14 NOTE — Telephone Encounter (Signed)
 I do believe his dizziness is related to his intermittent ear, which is why I referred him for the physical therapy.  I will order a referral to ENT, but they may not be able to help more than physical therapy can.

## 2023-10-15 NOTE — Telephone Encounter (Signed)
 Patient notified via My Chart

## 2023-11-02 ENCOUNTER — Encounter: Payer: Self-pay | Admitting: Internal Medicine

## 2023-11-09 ENCOUNTER — Other Ambulatory Visit (HOSPITAL_COMMUNITY): Payer: Self-pay

## 2023-11-09 DIAGNOSIS — H1013 Acute atopic conjunctivitis, bilateral: Secondary | ICD-10-CM | POA: Diagnosis not present

## 2023-11-09 DIAGNOSIS — Z961 Presence of intraocular lens: Secondary | ICD-10-CM | POA: Diagnosis not present

## 2023-11-09 DIAGNOSIS — T1511XA Foreign body in conjunctival sac, right eye, initial encounter: Secondary | ICD-10-CM | POA: Diagnosis not present

## 2023-11-09 DIAGNOSIS — H5704 Mydriasis: Secondary | ICD-10-CM | POA: Diagnosis not present

## 2023-11-09 DIAGNOSIS — R768 Other specified abnormal immunological findings in serum: Secondary | ICD-10-CM | POA: Diagnosis not present

## 2023-11-09 DIAGNOSIS — H4061X0 Glaucoma secondary to drugs, right eye, stage unspecified: Secondary | ICD-10-CM | POA: Diagnosis not present

## 2023-11-09 DIAGNOSIS — H179 Unspecified corneal scar and opacity: Secondary | ICD-10-CM | POA: Diagnosis not present

## 2023-11-09 MED ORDER — ERYTHROMYCIN 5 MG/GM OP OINT
TOPICAL_OINTMENT | OPHTHALMIC | 11 refills | Status: AC
  Filled 2023-11-09: qty 3.5, 7d supply, fill #0

## 2023-11-10 DIAGNOSIS — H1045 Other chronic allergic conjunctivitis: Secondary | ICD-10-CM | POA: Diagnosis not present

## 2023-11-10 DIAGNOSIS — K219 Gastro-esophageal reflux disease without esophagitis: Secondary | ICD-10-CM | POA: Diagnosis not present

## 2023-11-10 DIAGNOSIS — J3089 Other allergic rhinitis: Secondary | ICD-10-CM | POA: Diagnosis not present

## 2023-11-10 DIAGNOSIS — J301 Allergic rhinitis due to pollen: Secondary | ICD-10-CM | POA: Diagnosis not present

## 2023-11-13 ENCOUNTER — Ambulatory Visit
Admission: RE | Admit: 2023-11-13 | Discharge: 2023-11-13 | Disposition: A | Discharge status: Home / Self Care | Payer: Medicare Other | Source: Ambulatory Visit | Attending: Urology | Admitting: Urology

## 2023-11-13 DIAGNOSIS — R972 Elevated prostate specific antigen [PSA]: Secondary | ICD-10-CM | POA: Diagnosis not present

## 2023-11-13 MED ORDER — GADOPICLENOL 0.5 MMOL/ML IV SOLN
10.0000 mL | Freq: Once | INTRAVENOUS | Status: AC | PRN
  Administered 2023-11-13: 10 mL via INTRAVENOUS

## 2023-12-10 ENCOUNTER — Other Ambulatory Visit (HOSPITAL_COMMUNITY): Payer: Self-pay

## 2023-12-15 ENCOUNTER — Ambulatory Visit

## 2023-12-16 ENCOUNTER — Other Ambulatory Visit (HOSPITAL_COMMUNITY): Payer: Self-pay

## 2023-12-16 DIAGNOSIS — Z85820 Personal history of malignant melanoma of skin: Secondary | ICD-10-CM | POA: Diagnosis not present

## 2023-12-16 DIAGNOSIS — D1801 Hemangioma of skin and subcutaneous tissue: Secondary | ICD-10-CM | POA: Diagnosis not present

## 2023-12-16 DIAGNOSIS — L720 Epidermal cyst: Secondary | ICD-10-CM | POA: Diagnosis not present

## 2023-12-16 DIAGNOSIS — L2089 Other atopic dermatitis: Secondary | ICD-10-CM | POA: Diagnosis not present

## 2023-12-16 MED ORDER — DESONIDE 0.05 % EX CREA
TOPICAL_CREAM | CUTANEOUS | 2 refills | Status: AC
  Filled 2023-12-16: qty 15, 10d supply, fill #0
  Filled 2024-03-21: qty 15, 10d supply, fill #1
  Filled 2024-06-17: qty 15, 10d supply, fill #2

## 2023-12-18 DIAGNOSIS — H16001 Unspecified corneal ulcer, right eye: Secondary | ICD-10-CM | POA: Diagnosis not present

## 2023-12-18 DIAGNOSIS — Z79899 Other long term (current) drug therapy: Secondary | ICD-10-CM | POA: Diagnosis not present

## 2023-12-18 DIAGNOSIS — H3581 Retinal edema: Secondary | ICD-10-CM | POA: Diagnosis not present

## 2023-12-18 DIAGNOSIS — Z961 Presence of intraocular lens: Secondary | ICD-10-CM | POA: Diagnosis not present

## 2023-12-18 DIAGNOSIS — H30033 Focal chorioretinal inflammation, peripheral, bilateral: Secondary | ICD-10-CM | POA: Diagnosis not present

## 2023-12-18 DIAGNOSIS — H21541 Posterior synechiae (iris), right eye: Secondary | ICD-10-CM | POA: Diagnosis not present

## 2023-12-18 DIAGNOSIS — S0501XS Injury of conjunctiva and corneal abrasion without foreign body, right eye, sequela: Secondary | ICD-10-CM | POA: Diagnosis not present

## 2023-12-18 DIAGNOSIS — H209 Unspecified iridocyclitis: Secondary | ICD-10-CM | POA: Diagnosis not present

## 2023-12-18 DIAGNOSIS — H5704 Mydriasis: Secondary | ICD-10-CM | POA: Diagnosis not present

## 2023-12-18 DIAGNOSIS — H1013 Acute atopic conjunctivitis, bilateral: Secondary | ICD-10-CM | POA: Diagnosis not present

## 2023-12-22 ENCOUNTER — Other Ambulatory Visit (HOSPITAL_COMMUNITY): Payer: Self-pay

## 2023-12-22 DIAGNOSIS — Z961 Presence of intraocular lens: Secondary | ICD-10-CM | POA: Diagnosis not present

## 2023-12-22 DIAGNOSIS — H30033 Focal chorioretinal inflammation, peripheral, bilateral: Secondary | ICD-10-CM | POA: Diagnosis not present

## 2023-12-22 DIAGNOSIS — H21541 Posterior synechiae (iris), right eye: Secondary | ICD-10-CM | POA: Diagnosis not present

## 2023-12-22 DIAGNOSIS — H16001 Unspecified corneal ulcer, right eye: Secondary | ICD-10-CM | POA: Diagnosis not present

## 2023-12-22 DIAGNOSIS — Z79899 Other long term (current) drug therapy: Secondary | ICD-10-CM | POA: Diagnosis not present

## 2023-12-22 MED ORDER — VALACYCLOVIR HCL 1 G PO TABS
1000.0000 mg | ORAL_TABLET | Freq: Every day | ORAL | 3 refills | Status: DC
  Filled 2023-12-22: qty 90, 90d supply, fill #0

## 2023-12-23 ENCOUNTER — Other Ambulatory Visit (HOSPITAL_COMMUNITY): Payer: Self-pay

## 2023-12-23 ENCOUNTER — Other Ambulatory Visit: Payer: Self-pay

## 2023-12-23 MED ORDER — MYCOPHENOLATE MOFETIL 250 MG PO CAPS
500.0000 mg | ORAL_CAPSULE | Freq: Two times a day (BID) | ORAL | 5 refills | Status: DC
  Filled 2023-12-23: qty 360, 90d supply, fill #0

## 2023-12-24 ENCOUNTER — Other Ambulatory Visit (HOSPITAL_COMMUNITY): Payer: Self-pay

## 2023-12-30 ENCOUNTER — Other Ambulatory Visit: Payer: Self-pay | Admitting: Cardiology

## 2023-12-30 ENCOUNTER — Other Ambulatory Visit (HOSPITAL_COMMUNITY): Payer: Self-pay

## 2023-12-30 DIAGNOSIS — I2510 Atherosclerotic heart disease of native coronary artery without angina pectoris: Secondary | ICD-10-CM

## 2023-12-30 DIAGNOSIS — E785 Hyperlipidemia, unspecified: Secondary | ICD-10-CM

## 2023-12-30 MED ORDER — REPATHA SURECLICK 140 MG/ML ~~LOC~~ SOAJ
140.0000 mg | SUBCUTANEOUS | 3 refills | Status: AC
Start: 2023-12-30 — End: ?
  Filled 2023-12-30: qty 6, 84d supply, fill #0
  Filled 2024-03-21: qty 6, 84d supply, fill #1
  Filled 2024-06-17 – 2024-06-20 (×3): qty 6, 84d supply, fill #2

## 2023-12-31 ENCOUNTER — Ambulatory Visit (INDEPENDENT_AMBULATORY_CARE_PROVIDER_SITE_OTHER): Admitting: Otolaryngology

## 2023-12-31 ENCOUNTER — Ambulatory Visit (INDEPENDENT_AMBULATORY_CARE_PROVIDER_SITE_OTHER): Admitting: Audiology

## 2023-12-31 ENCOUNTER — Encounter (INDEPENDENT_AMBULATORY_CARE_PROVIDER_SITE_OTHER): Payer: Self-pay | Admitting: Otolaryngology

## 2023-12-31 ENCOUNTER — Encounter: Payer: Self-pay | Admitting: Internal Medicine

## 2023-12-31 VITALS — BP 148/81 | HR 59 | Ht 70.0 in | Wt 226.0 lb

## 2023-12-31 DIAGNOSIS — H8111 Benign paroxysmal vertigo, right ear: Secondary | ICD-10-CM

## 2023-12-31 DIAGNOSIS — H903 Sensorineural hearing loss, bilateral: Secondary | ICD-10-CM | POA: Diagnosis not present

## 2023-12-31 DIAGNOSIS — H9313 Tinnitus, bilateral: Secondary | ICD-10-CM

## 2023-12-31 DIAGNOSIS — R2689 Other abnormalities of gait and mobility: Secondary | ICD-10-CM | POA: Diagnosis not present

## 2023-12-31 NOTE — Progress Notes (Signed)
  [address] [place], [place] [address] [phone]  Audiological Evaluation    Name: Daniel Hampton     DOB:   [birth year]      MRN:   [id number]                                                                                     Service Date: 12/31/2023     Accompanied by: unaccompanied    Patient comes today after Dr. Lydia Sams, ENT sent a referral for a hearing evaluation due to concerns with hearing loss.   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 Left ear:  Clear external ear canals and notable landmarks visualized on the tympanic membrane.  Tympanometry: Right ear: Type A- Normal external ear canal volume with normal middle ear pressure and tympanic membrane compliance. Left ear: Type Ad- Normal external ear canal volume with normal middle ear pressure and high tympanic membrane compliance.    Pure tone Audiometry: Right ear- Normal to profound sensorineural hearing loss from 125 Hz - 8000 Hz. Left ear-  Normal to profound sensorineural hearing loss from 125 Hz - 8000 Hz.  Speech Audiometry: Right ear- Speech Reception Threshold (SRT) was obtained at 40 dBHL. Left ear-Speech Reception Threshold (SRT) was obtained at 25 dBHL.   Word Recognition Score Tested using NU-6 (MLV) Right ear: 32% was obtained at a presentation level of 95 dBHL with contralateral masking which is deemed as  poor. Left ear: 36% was obtained at a presentation level of 95 dBHL with contralateral masking which is deemed as  poor.    The hearing test results were completed under headphones (used inserts when testing masked bone conduction) and results are deemed to be of good to fair reliability. Test technique:  conventional    Recommendations: Follow up with ENT as scheduled for today.  Return for a hearing evaluation if concerns with hearing changes arise or per MD recommendation. Consider referral for a vestibular assessment. ( Patient has already been ti physical therapy).  Consider a communication needs assessment after medical clearance for hearing aids is obtained.   Elaiza Shoberg MARIE LEROUX-MARTINEZ, AUD

## 2023-12-31 NOTE — Progress Notes (Unsigned)
 Subjective:    Patient ID: Daniel Hampton, male    DOB: Sep 24, 1940, 83 y.o.   MRN: [id number]     HPI Daniel Hampton is here for a physical exam and his chronic medical problems.   Overall doing well.  His wife stopped chemo.     Medications and allergies reviewed with patient and updated if appropriate.  Current Outpatient Medications on File Prior to Visit  Medication Sig Dispense Refill   Cetirizine  HCl (ZYRTEC  PO) Take by mouth.     Coenzyme Q10 (COQ-10 PO) Take 1 tablet by mouth daily.     desonide  (DESOWEN ) 0.05 % cream Apply a small amount to affected area twice a day. Can use as needed on face. 15 g 2   erythromycin  ophthalmic ointment Apply to left eyelid and eyeball at night or as needed for discomfort 3.5 g 11   Evolocumab  (REPATHA  SURECLICK) 140 MG/ML SOAJ Inject 1 ML (140 MG) into the skin every 14 days 6 mL 3   famotidine  (PEPCID ) 20 MG tablet Take 20 mg by mouth 2 (two) times daily.     hydroxyprophyl methylcellulose / hypromellose (ISOPTO TEARS) 0.5 % opthalmic solution Apply 1 drop to eye.     meclizine  (ANTIVERT ) 25 MG tablet Take 0.5-1 tablets (12.5-25 mg total) by mouth 3 (three) times daily as needed for dizziness. 30 tablet 2   Multiple Vitamins-Minerals (ALIVE MULTI-VITAMIN PO) Take 1 tablet by mouth daily.     mycophenolate (CELLCEPT) 250 MG capsule Take 2 capsules (500 mg total) by mouth 2 (two) times daily. 360 capsule 5   prednisoLONE  acetate (PRED FORTE ) 1 % ophthalmic suspension Place 1 drop into the right eye daily.     timolol  (TIMOPTIC ) 0.5 % ophthalmic solution Administer 1 drop into each eyes 2 (two) times a day. 10 mL 11   valACYclovir (VALTREX) 1000 MG tablet Take 1 tablet (1,000 mg total) by mouth daily. 90 tablet 3   No current facility-administered medications on file prior to visit.    Review of Systems  Constitutional:  Negative for fever.  Eyes:  Positive for visual disturbance (chronic).  Respiratory:  Negative for cough, shortness of breath and  wheezing.   Cardiovascular:  Negative for chest pain, palpitations and leg swelling.  Gastrointestinal:  Negative for abdominal pain, blood in stool, constipation and diarrhea.       No gerd - controlled  Genitourinary:  Negative for difficulty urinating and dysuria.  Musculoskeletal:  Positive for arthralgias (right knee) and back pain.  Skin:  Negative for rash.  Neurological:  Negative for light-headedness and headaches.  Psychiatric/Behavioral:  Negative for dysphoric mood. The patient is not nervous/anxious.        Objective:   Vitals:   01/01/24 1259  BP: 136/82  Pulse: (!) 53  Temp: 98.1 F (36.7 C)  SpO2: 94%   Filed Weights   01/01/24 1259  Weight: 231 lb (104.8 kg)   Body mass index is 33.15 kg/m.  BP Readings from Last 3 Encounters:  01/01/24 136/82  12/31/23 (!) 148/81  08/13/23 (!) 147/80    Wt Readings from Last 3 Encounters:  01/01/24 231 lb (104.8 kg)  12/31/23 226 lb (102.5 kg)  08/13/23 232 lb 12.8 oz (105.6 kg)      Physical Exam Constitutional: He appears well-developed and well-nourished. No distress.  HENT:  Head: Normocephalic and atraumatic.  Right Ear: External ear normal.  Left Ear: External ear normal.  Normal ear canals and TM b/l  Mouth/Throat:  Oropharynx is clear and moist. Eyes: Conjunctivae and EOM are normal.  Neck: Neck supple. No tracheal deviation present. No thyromegaly present.  No carotid bruit  Cardiovascular: Normal rate, regular rhythm, normal heart sounds and intact distal pulses.   2/6 sys murmur heard.  No lower extremity edema. Pulmonary/Chest: Effort normal and breath sounds normal. No respiratory distress. He has no wheezes. He has no rales.  Abdominal: Soft. He exhibits no distension. There is no tenderness.  Ventral hernia Genitourinary: deferred  Lymphadenopathy:   He has no cervical adenopathy.  Skin: Skin is warm and dry. He is not diaphoretic.  Psychiatric: He has a normal mood and affect. His behavior  is normal.         Assessment & Plan:   Physical exam: Screening blood work  ordered Exercise   walking Weight  obese Substance abuse   none   Reviewed recommended immunizations.   Health Maintenance  Topic Date Due   FOOT EXAM  Never done   OPHTHALMOLOGY EXAM  Never done   Diabetic kidney evaluation - Urine ACR  12/31/2023   Medicare Annual Wellness (AWV)  02/23/2024   COVID-19 Vaccine (4 - 2024-25 season) 01/16/2024 (Originally 04/05/2023)   HEMOGLOBIN A1C  02/07/2024   INFLUENZA VACCINE  03/04/2024   Diabetic kidney evaluation - eGFR measurement  08/09/2024   DTaP/Tdap/Td (3 - Td or Tdap) 08/27/2031   Pneumonia Vaccine 42+ Years old  Completed   HPV VACCINES  Aged Out   Meningococcal B Vaccine  Aged Out   Colonoscopy  Discontinued   Zoster Vaccines- Shingrix  Discontinued     See Problem List for Assessment and Plan of chronic medical problems.

## 2023-12-31 NOTE — Progress Notes (Signed)
 Dear Dr. Donnette Gal, Here is my assessment for our mutual patient, Daniel Hampton. Thank you for allowing me the opportunity to care for your patient. Please do not hesitate to contact me should you have any other questions. Sincerely, Dr. Milon Aloe  Otolaryngology Clinic Note Referring provider: Dr. Donnette Gal HPI:  Daniel Hampton is a 83 y.o. male kindly referred by Dr. Donnette Gal for evaluation of vertigo.  Initial visit (12/2023): Patient reports: reports imbalance "all the time" but positional - when he gets out of bed or a chair, he has to "stabilize himself for a few seconds" before he gets going. He can also induce it when he lays on his right side - "lasts for a few secs". When he walks generally or plays golf, he typically does not have issues. Did have an episode where he had acute onset vertigo in September 2024 for which he went to ED, and then did vestibular therapy -- did help some. Does also have right eye problems which with noted "bad depth perception". No sound or pressure induced vertigo. No episodic vertigo or roaring tinnitus associated with hearing change. These episodes have been ongoing since his eye issues - at least two years. No facial numbness, weakness. Tried meclizine  and dramamine - very sedating. Reports Meclizine  does not help. Has not taken it in a few months No multiple hour long episodes or episodes involving ear fullness, tinnitus. Does have chronic long-standing hearing loss and bilateral non-pulsatile tinnitus. Tinnitus in quiet environments, does not bother significantly. No HA He is on cellcept   Patient denies: ear pain, fullness, drainage Patient additionally denies: deep pain in ear canal, eustachian tube symptoms such as popping/crackling, sensitive to pressure changes Patient also denies barotrauma, ototoxic medication use Prior ear surgery: no  H&N Surgery: no (besides eye surgery) Personal or FHx of bleeding dz or anesthesia difficulty: no  AP/AC: no  Tobacco:  no  PMHx: GERD, Atherosclerosis, TIA, HLD  Independent Review of Additional Tests or Records:  MRI Brain wo ct 04/07/2023 independently interpreted with respect to ears: no dedicated IAC cuts and no contrast so suboptimal study but no retrocochlear lesions noted Vestibular PT (multiple visits since Oct 2024): Daniel Hampton - noteddisequilibrium  Daniel Hampton (04/07/2023) and Daniel Hampton notes: noted dizziness which is acute, different than other episodes. No headache, N/V; Dx: Possible TIA, dizziness, sent to ED ED notes 04/07/2023: noted HINTs negative, though BPPV; MRI done, given meclizine  and ativan  Dr. Donnette Gal 04/14/2023:For imbalance, dizziness; Rx: vestibular PT  12/2023 Audiogram was independently reviewed and interpreted by me and it reveals - noted b/l mild downsloping to profound SNHL, mild asymmetry (<10dB) isolated freq; WRT 32 and 36% AD/AS at 95dB; A/Ad tymps   SNHL= Sensorineural hearing loss  PMH/Meds/All/SocHx/FamHx/ROS:   Past Medical History:  Diagnosis Date   Allergy    SEASONAL   Arthritis    Asthma    COLONIC POLYPS, HX OF 03/12/2007   DISC DISEASE, LUMBAR 12/03/2008   DIVERTICULOSIS, COLON 10/17/2009   History of kidney stones    Hydrocele    HYPERLIPIDEMIA 03/12/2007   Hyphema of right eye    Complicated by allergic conjunctivitis   Melanoma (HCC) 04/23/2020   in situ-Left Upper Arm   MORTON'S NEUROMA, RIGHT 03/13/2008   SCC (squamous cell carcinoma) 04/26/2013   right hand-txpbx   SKIN CANCER, HX OF 10/17/2009   VERTIGO, BENIGN PAROXYSMAL POSITION 04/28/2007     Past Surgical History:  Procedure Laterality Date   BACK SURGERY     COLONOSCOPY W/  BIOPSIES     CT CTA CORONARY W/CA SCORE W/CM &/OR WO/CM  01/27/2022   1. Coronary artery calcium  score 1540 Agatston units. This places the patient in the 76th percentile for age and gender, suggesting intermediate risk for future events.  2. Extensive coronary disease but appears nonobstructive. This is confirmed by  CT FFR. 3. 4.2 cm dilation of the ascending aorta,   EYE SURGERY Left    LUMBAR LAMINECTOMY/ DECOMPRESSION WITH MET-RX Left 03/01/2021   Procedure: LEFT LUMBAR FOUR-FIVE MINIMALLY INVASIVE LAMINECTOMY, LATERAL RECESS DECOMPRESSION AND RESECTION OF SYNOVIAL CYST;  Surgeon: Pincus Bridgeman, DO;  Location: [facility] OR;  Service: Neurosurgery;  Laterality: Left;   MOHS SURGERY     SPINE SURGERY     lumbar disk surgery    Family History  Problem Relation Age of Onset   Lung cancer Mother    Ovarian cancer Mother    COPD Father    Heart attack Father    Alcohol abuse Brother    Lung cancer Brother        smoker   Breast cancer Daughter    Breast cancer Daughter    Colon cancer Neg Hx    Esophageal cancer Neg Hx    Rectal cancer Neg Hx    Stomach cancer Neg Hx      Social Connections: Socially Integrated (01/06/2024)   Social Connection and Isolation Panel [NHANES]    Frequency of Communication with Friends and Family: More than three times a week    Frequency of Social Gatherings with Friends and Family: Once a week    Attends Religious Services: 1 to 4 times per year    Active Member of [employer] or Organizations: Yes    Attends Banker Meetings: Never    Marital Status: Married      Current Outpatient Medications:    Coenzyme Q10 (COQ-10 PO), Take 1 tablet by mouth daily., Disp: , Rfl:    desonide  (DESOWEN ) 0.05 % cream, Apply a small amount to affected area twice a day. Can use as needed on face., Disp: 15 g, Rfl: 2   erythromycin  ophthalmic ointment, Apply to left eyelid and eyeball at night or as needed for discomfort, Disp: 3.5 g, Rfl: 11   Evolocumab  (REPATHA  SURECLICK) 140 MG/ML SOAJ, Inject 1 ML (140 MG) into the skin every 14 days, Disp: 6 mL, Rfl: 3   famotidine  (PEPCID ) 20 MG tablet, Take 20 mg by mouth 2 (two) times daily., Disp: , Rfl:    hydroxyprophyl methylcellulose / hypromellose (ISOPTO TEARS) 0.5 % opthalmic solution, Apply 1 drop to eye., Disp: , Rfl:     meclizine  (ANTIVERT ) 25 MG tablet, Take 0.5-1 tablets (12.5-25 mg total) by mouth 3 (three) times daily as needed for dizziness., Disp: 30 tablet, Rfl: 2   Multiple Vitamins-Minerals (ALIVE MULTI-VITAMIN PO), Take 1 tablet by mouth daily., Disp: , Rfl:    mycophenolate  (CELLCEPT ) 250 MG capsule, Take 2 capsules (500 mg total) by mouth 2 (two) times daily., Disp: 360 capsule, Rfl: 5   prednisoLONE  acetate (PRED FORTE ) 1 % ophthalmic suspension, Place 1 drop into the right eye daily., Disp: , Rfl:    timolol  (TIMOPTIC ) 0.5 % ophthalmic solution, Administer 1 drop into each eyes 2 (two) times a day., Disp: 10 mL, Rfl: 11   valACYclovir  (VALTREX ) 1000 MG tablet, Take 1 tablet (1,000 mg total) by mouth daily., Disp: 90 tablet, Rfl: 3   Cetirizine  HCl (ZYRTEC  PO), Take by mouth., Disp: , Rfl:  Physical Exam:   BP (!) 148/81 (BP Location: Right Arm, Patient Position: Sitting, Cuff Size: Large)   Pulse (!) 59   Ht 5\' 10"  (1.778 m)   Wt 226 lb (102.5 kg)   SpO2 94%   BMI 32.43 kg/m   Salient findings:  CN VII-XII intact; right ptosis No gross nystagmus Gait not broad based Head shake neg, DH neg Given history and complaints, ear microscopy was indicated and performed for evaluation with findings as below in physical exam section and in procedures;  Bilateral EAC clear and TM intact with well pneumatized middle ear spaces Weber 512: mid Rinne 512: AC > BC b/l  No lesions of oral cavity/oropharynx; No obviously palpable neck masses No respiratory distress or stridor Right ptosis  Seprately Identifiable Procedures:  Prior to initiating any procedures, risks/benefits/alternatives were explained to the patient and verbal consent obtained. Procedure: Bilateral ear microscopy using microscope (CPT [id number]) Pre-procedure diagnosis: bilatera hearing loss, vertigo Post-procedure diagnosis: same Indication: see above; given patient's otologic complaints and history, for improved and comprehensive  examination of external ear and tympanic membrane, bilateral otologic examination using microscope was performed  Procedure: Patient was placed semi-recumbent. Both ear canals were examined using the microscope with findings above. Patient tolerated the procedure well.   Impression & Plans:  Daniel Hampton is a 83 y.o. male with:  1. Benign paroxysmal positional vertigo of right ear   2. Imbalance   3. Bilateral tinnitus   4. Sensorineural hearing loss (SNHL) of both ears    Based on hx, appears to be most likely multifactorial - poor proprioception, poor depth perception can explain some imbalance; otologic cause such as BPPV most likely based on prior testing and improvement with PT. He reports his symptoms are overall better after PT. We discussed options for further mgmt: continued exercises at home, repeat PT, or formal vestibular testing. Also discussed hearing options - could consider HA He would like to observe currently. If sx worsen or continue, he reports he will call us  to pursue repeat vest PT or referral to [facility] for formal vestib testing Meclizine  not helping and given effects would avoid; avoid antihistamines if able He wishd to f/u PRN - rec HT in 1-2 years  See below regarding exact medications prescribed this encounter including dosages and route: No orders of the defined types were placed in this encounter.     Thank you for allowing me the opportunity to care for your patient. Please do not hesitate to contact me should you have any other questions.  Sincerely, Milon Aloe, MD Otolaryngologist (ENT), [facility] Phone: [phone] Fax: [phone]  01/10/2024, 8:50 PM   MDM:  Level 4 - 99204 Complexity/Problems addressed: mod - chronic issues Data complexity: high - independent review of notes, ordering/review of tests; independent MRI interpretation - Morbidity: low currently  - Prescription Drug prescribed or managed: no

## 2023-12-31 NOTE — Patient Instructions (Addendum)
 Blood work was ordered.       Medications changes include :   None    A referral was ordered and someone will call you to schedule an appointment.     Return in about 6 months (around 07/03/2024) for follow up.   Health Maintenance, Male Adopting a healthy lifestyle and getting preventive care are important in promoting health and wellness. Ask your health care provider about: The right schedule for you to have regular tests and exams. Things you can do on your own to prevent diseases and keep yourself healthy. What should I know about diet, weight, and exercise? Eat a healthy diet  Eat a diet that includes plenty of vegetables, fruits, low-fat dairy products, and lean protein. Do not eat a lot of foods that are high in solid fats, added sugars, or sodium. Maintain a healthy weight Body mass index (BMI) is a measurement that can be used to identify possible weight problems. It estimates body fat based on height and weight. Your health care provider can help determine your BMI and help you achieve or maintain a healthy weight. Get regular exercise Get regular exercise. This is one of the most important things you can do for your health. Most adults should: Exercise for at least 150 minutes each week. The exercise should increase your heart rate and make you sweat (moderate-intensity exercise). Do strengthening exercises at least twice a week. This is in addition to the moderate-intensity exercise. Spend less time sitting. Even light physical activity can be beneficial. Watch cholesterol and blood lipids Have your blood tested for lipids and cholesterol at 84 years of age, then have this test every 5 years. You may need to have your cholesterol levels checked more often if: Your lipid or cholesterol levels are high. You are older than 84 years of age. You are at high risk for heart disease. What should I know about cancer screening? Many types of cancers can be detected  early and may often be prevented. Depending on your health history and family history, you may need to have cancer screening at various ages. This may include screening for: Colorectal cancer. Prostate cancer. Skin cancer. Lung cancer. What should I know about heart disease, diabetes, and high blood pressure? Blood pressure and heart disease High blood pressure causes heart disease and increases the risk of stroke. This is more likely to develop in people who have high blood pressure readings or are overweight. Talk with your health care provider about your target blood pressure readings. Have your blood pressure checked: Every 3-5 years if you are 69-88 years of age. Every year if you are 64 years old or older. If you are between the ages of 35 and 60 and are a current or former smoker, ask your health care provider if you should have a one-time screening for abdominal aortic aneurysm (AAA). Diabetes Have regular diabetes screenings. This checks your fasting blood sugar level. Have the screening done: Once every three years after age 70 if you are at a normal weight and have a low risk for diabetes. More often and at a younger age if you are overweight or have a high risk for diabetes. What should I know about preventing infection? Hepatitis B If you have a higher risk for hepatitis B, you should be screened for this virus. Talk with your health care provider to find out if you are at risk for hepatitis B infection. Hepatitis C Blood testing is recommended for:  Everyone born from [birth year] through 1966. Anyone with known risk factors for hepatitis C. Sexually transmitted infections (STIs) You should be screened each year for STIs, including gonorrhea and chlamydia, if: You are sexually active and are younger than 84 years of age. You are older than 84 years of age and your health care provider tells you that you are at risk for this type of infection. Your sexual activity has changed since  you were last screened, and you are at increased risk for chlamydia or gonorrhea. Ask your health care provider if you are at risk. Ask your health care provider about whether you are at high risk for HIV. Your health care provider may recommend a prescription medicine to help prevent HIV infection. If you choose to take medicine to prevent HIV, you should first get tested for HIV. You should then be tested every 3 months for as long as you are taking the medicine. Follow these instructions at home: Alcohol use Do not drink alcohol if your health care provider tells you not to drink. If you drink alcohol: Limit how much you have to 0-2 drinks a day. Know how much alcohol is in your drink. In the [place] one drink equals one 12 oz bottle of beer (355 mL), one 5 oz glass of wine (148 mL), or one 1 oz glass of hard liquor (44 mL). Lifestyle Do not use any products that contain nicotine or tobacco. These products include cigarettes, chewing tobacco, and vaping devices, such as e-cigarettes. If you need help quitting, ask your health care provider. Do not use street drugs. Do not share needles. Ask your health care provider for help if you need support or information about quitting drugs. General instructions Schedule regular health, dental, and eye exams. Stay current with your vaccines. Tell your health care provider if: You often feel depressed. You have ever been abused or do not feel safe at home. Summary Adopting a healthy lifestyle and getting preventive care are important in promoting health and wellness. Follow your health care provider's instructions about healthy diet, exercising, and getting tested or screened for diseases. Follow your health care provider's instructions on monitoring your cholesterol and blood pressure. This information is not intended to replace advice given to you by your health care provider. Make sure you discuss any questions you have with your health care  provider. Document Revised: 12/10/2020 Document Reviewed: 12/10/2020 Elsevier Patient Education  2024 [employer].

## 2024-01-01 ENCOUNTER — Ambulatory Visit: Admitting: Internal Medicine

## 2024-01-01 ENCOUNTER — Ambulatory Visit: Payer: Self-pay | Admitting: Internal Medicine

## 2024-01-01 VITALS — BP 136/82 | HR 53 | Temp 98.1°F | Ht 70.0 in | Wt 231.0 lb

## 2024-01-01 DIAGNOSIS — Z0000 Encounter for general adult medical examination without abnormal findings: Secondary | ICD-10-CM

## 2024-01-01 DIAGNOSIS — E1169 Type 2 diabetes mellitus with other specified complication: Secondary | ICD-10-CM | POA: Diagnosis not present

## 2024-01-01 DIAGNOSIS — I2510 Atherosclerotic heart disease of native coronary artery without angina pectoris: Secondary | ICD-10-CM | POA: Diagnosis not present

## 2024-01-01 DIAGNOSIS — E785 Hyperlipidemia, unspecified: Secondary | ICD-10-CM

## 2024-01-01 DIAGNOSIS — Z85820 Personal history of malignant melanoma of skin: Secondary | ICD-10-CM

## 2024-01-01 DIAGNOSIS — T466X5A Adverse effect of antihyperlipidemic and antiarteriosclerotic drugs, initial encounter: Secondary | ICD-10-CM

## 2024-01-01 DIAGNOSIS — G720 Drug-induced myopathy: Secondary | ICD-10-CM

## 2024-01-01 DIAGNOSIS — K219 Gastro-esophageal reflux disease without esophagitis: Secondary | ICD-10-CM

## 2024-01-01 LAB — MICROALBUMIN / CREATININE URINE RATIO
Creatinine,U: 76.9 mg/dL
Microalb Creat Ratio: UNDETERMINED mg/g (ref 0.0–30.0)
Microalb, Ur: <0.7 mg/dL

## 2024-01-01 LAB — CBC WITH DIFFERENTIAL/PLATELET
Basophils Absolute: 0.1 10*3/uL (ref 0.0–0.1)
Basophils Relative: 0.8 % (ref 0.0–3.0)
Eosinophils Absolute: 0.1 10*3/uL (ref 0.0–0.7)
Eosinophils Relative: 1.7 % (ref 0.0–5.0)
HCT: 46.6 % (ref 39.0–52.0)
Hemoglobin: 15.8 g/dL (ref 13.0–17.0)
Lymphocytes Relative: 36.7 % (ref 12.0–46.0)
Lymphs Abs: 2.9 10*3/uL (ref 0.7–4.0)
MCHC: 34 g/dL (ref 30.0–36.0)
MCV: 97 fl (ref 78.0–100.0)
Monocytes Absolute: 0.6 10*3/uL (ref 0.1–1.0)
Monocytes Relative: 7 % (ref 3.0–12.0)
Neutro Abs: 4.3 10*3/uL (ref 1.4–7.7)
Neutrophils Relative %: 53.8 % (ref 43.0–77.0)
Platelets: 216 10*3/uL (ref 150.0–400.0)
RBC: 4.8 Mil/uL (ref 4.22–5.81)
RDW: 12.7 % (ref 11.5–15.5)
WBC: 8 10*3/uL (ref 4.0–10.5)

## 2024-01-01 LAB — LIPID PANEL
Cholesterol: 123 mg/dL (ref 0–200)
HDL: 52.1 mg/dL (ref >39.00)
LDL Cholesterol: 42 mg/dL (ref 0–99)
NonHDL: 70.41
Total CHOL/HDL Ratio: 2
Triglycerides: 143 mg/dL (ref 0.0–149.0)
VLDL: 28.6 mg/dL (ref 0.0–40.0)

## 2024-01-01 LAB — COMPREHENSIVE METABOLIC PANEL WITH GFR
ALT: 28 U/L (ref 0–53)
AST: 30 U/L (ref 0–37)
Albumin: 4.7 g/dL (ref 3.5–5.2)
Alkaline Phosphatase: 53 U/L (ref 39–117)
BUN: 16 mg/dL (ref 6–23)
CO2: 28 meq/L (ref 19–32)
Calcium: 9.2 mg/dL (ref 8.4–10.5)
Chloride: 103 meq/L (ref 96–112)
Creatinine, Ser: 1.01 mg/dL (ref 0.40–1.50)
GFR: 68.54 mL/min (ref >60.00)
Glucose, Bld: 96 mg/dL (ref 70–99)
Potassium: 4.4 meq/L (ref 3.5–5.1)
Sodium: 140 meq/L (ref 135–145)
Total Bilirubin: 0.8 mg/dL (ref 0.2–1.2)
Total Protein: 7.5 g/dL (ref 6.0–8.3)

## 2024-01-01 LAB — HEMOGLOBIN A1C: Hgb A1c MFr Bld: 6.1 % (ref 4.6–6.5)

## 2024-01-01 NOTE — Assessment & Plan Note (Signed)
Chronic Following with Dr. Herbie [place] On Repatha 140 mg Q 14 days Continue regular walking

## 2024-01-01 NOTE — Assessment & Plan Note (Signed)
Chronic GERD controlled Continue famotidine 20 mg bid

## 2024-01-01 NOTE — Assessment & Plan Note (Signed)
 Chronic Intolerant of low doses of simvastatin and atorvastatin On repatha

## 2024-01-01 NOTE — Assessment & Plan Note (Addendum)
 Chronic  Lab Results  Component Value Date   HGBA1C 6.1 08/10/2023   Check A1c, urine albumin/creatinine ratio Sugars controlled Currently on lifestyle control Stressed regular exercise, diabetic diet

## 2024-01-01 NOTE — Assessment & Plan Note (Signed)
Sees derm regularly.

## 2024-01-01 NOTE — Assessment & Plan Note (Signed)
 Chronic Check lipid panel, cmp Continue Repatha  140 mg q. 14 days regular walking and healthy diet encouraged

## 2024-01-06 ENCOUNTER — Ambulatory Visit (INDEPENDENT_AMBULATORY_CARE_PROVIDER_SITE_OTHER)

## 2024-01-06 VITALS — Ht 70.0 in | Wt 231.0 lb

## 2024-01-06 DIAGNOSIS — Z0000 Encounter for general adult medical examination without abnormal findings: Secondary | ICD-10-CM | POA: Diagnosis not present

## 2024-01-06 NOTE — Progress Notes (Signed)
 Subjective:   Daniel Hampton is a 83 y.o. who presents for a Medicare Wellness preventive visit.  As a reminder, Annual Wellness Visits don't include a physical exam, and some assessments may be limited, especially if this visit is performed virtually. We may recommend an in-person follow-up visit with your provider if needed.  Visit Complete: Virtual I connected with  Daniel Hampton on 01/06/24 by a audio enabled telemedicine application and verified that I am speaking with the correct person using two identifiers.  Patient Location: Home  Provider Location: Home Office  I discussed the limitations of evaluation and management by telemedicine. The patient expressed understanding and agreed to proceed.  Vital Signs: Because this visit was a virtual/telehealth visit, some criteria may be missing or patient reported. Any vitals not documented were not able to be obtained and vitals that have been documented are patient reported.  VideoDeclined- This patient declined Librarian, academic. Therefore the visit was completed with audio only.  Persons Participating in Visit: Patient.  AWV Questionnaire: No: Patient Medicare AWV questionnaire was not completed prior to this visit.  Cardiac Risk Factors include: advanced age (>25men, >25 women);diabetes mellitus;dyslipidemia;male gender;Other (see comment), Risk factor comments: TIA,  Aortic atherosclerosis ,Emphysema lung     Objective:     Today's Vitals   01/06/24 0840  Weight: 231 lb (104.8 kg)  Height: 5\' 10"  (1.778 m)   Body mass index is 33.15 kg/m.     01/06/2024    8:57 AM 06/02/2023    2:43 PM 04/07/2023    1:54 PM 02/23/2023    3:39 PM 05/11/2022   11:35 PM 02/26/2021    8:10 AM 10/29/2020    7:53 AM  Advanced Directives  Does Patient Have a Medical Advance Directive? Yes No No No No No No  Type of Estate agent of [place];Living will        Copy of Healthcare Power of Attorney  in Chart? No - copy requested        Would patient like information on creating a medical advance directive?    No - Patient declined No - Patient declined      Current Medications (verified) Outpatient Encounter Medications as of 01/06/2024  Medication Sig   Cetirizine  HCl (ZYRTEC  PO) Take by mouth.   Coenzyme Q10 (COQ-10 PO) Take 1 tablet by mouth daily.   desonide  (DESOWEN ) 0.05 % cream Apply a small amount to affected area twice a day. Can use as needed on face.   erythromycin  ophthalmic ointment Apply to left eyelid and eyeball at night or as needed for discomfort   Evolocumab  (REPATHA  SURECLICK) 140 MG/ML SOAJ Inject 1 ML (140 MG) into the skin every 14 days   famotidine  (PEPCID ) 20 MG tablet Take 20 mg by mouth 2 (two) times daily.   hydroxyprophyl methylcellulose / hypromellose (ISOPTO TEARS) 0.5 % opthalmic solution Apply 1 drop to eye.   meclizine  (ANTIVERT ) 25 MG tablet Take 0.5-1 tablets (12.5-25 mg total) by mouth 3 (three) times daily as needed for dizziness.   Multiple Vitamins-Minerals (ALIVE MULTI-VITAMIN PO) Take 1 tablet by mouth daily.   mycophenolate  (CELLCEPT ) 250 MG capsule Take 2 capsules (500 mg total) by mouth 2 (two) times daily.   prednisoLONE  acetate (PRED FORTE ) 1 % ophthalmic suspension Place 1 drop into the right eye daily.   timolol  (TIMOPTIC ) 0.5 % ophthalmic solution Administer 1 drop into each eyes 2 (two) times a day.   valACYclovir  (VALTREX ) 1000 MG  tablet Take 1 tablet (1,000 mg total) by mouth daily.   No facility-administered encounter medications on file as of 01/06/2024.    Allergies (verified) Atorvastatin , Crestor  [rosuvastatin ], Imuran  [azathioprine ], and Tape   History: Past Medical History:  Diagnosis Date   Allergy    SEASONAL   Arthritis    Asthma    COLONIC POLYPS, HX OF 03/12/2007   DISC DISEASE, LUMBAR 12/03/2008   DIVERTICULOSIS, COLON 10/17/2009   History of kidney stones    Hydrocele    HYPERLIPIDEMIA 03/12/2007   Hyphema of  right eye    Complicated by allergic conjunctivitis   Melanoma (HCC) 04/23/2020   in situ-Left Upper Arm   MORTON'S NEUROMA, RIGHT 03/13/2008   SCC (squamous cell carcinoma) 04/26/2013   right hand-txpbx   SKIN CANCER, HX OF 10/17/2009   VERTIGO, BENIGN PAROXYSMAL POSITION 04/28/2007   Past Surgical History:  Procedure Laterality Date   BACK SURGERY     COLONOSCOPY W/ BIOPSIES     CT CTA CORONARY W/CA SCORE W/CM &/OR WO/CM  01/27/2022   1. Coronary artery calcium  score 1540 Agatston units. This places the patient in the 76th percentile for age and gender, suggesting intermediate risk for future events.  2. Extensive coronary disease but appears nonobstructive. This is confirmed by CT FFR. 3. 4.2 cm dilation of the ascending aorta,   EYE SURGERY Left    LUMBAR LAMINECTOMY/ DECOMPRESSION WITH MET-RX Left 03/01/2021   Procedure: LEFT LUMBAR FOUR-FIVE MINIMALLY INVASIVE LAMINECTOMY, LATERAL RECESS DECOMPRESSION AND RESECTION OF SYNOVIAL CYST;  Surgeon: Pincus Bridgeman, DO;  Location: [facility] OR;  Service: Neurosurgery;  Laterality: Left;   MOHS SURGERY     SPINE SURGERY     lumbar disk surgery   Family History  Problem Relation Age of Onset   Lung cancer Mother    Ovarian cancer Mother    COPD Father    Heart attack Father    Alcohol abuse Brother    Lung cancer Brother        smoker   Breast cancer Daughter    Breast cancer Daughter    Colon cancer Neg Hx    Esophageal cancer Neg Hx    Rectal cancer Neg Hx    Stomach cancer Neg Hx    Social History   Socioeconomic History   Marital status: Married    Spouse name: Raynelle Callow   Number of children: 3   Years of education: Not on file   Highest education level: 12th grade  Occupational History   Occupation: Retired  Tobacco Use   Smoking status: Former    Current packs/day: 0.00    Average packs/day: 1 pack/day for 20.0 years (20.0 ttl pk-yrs)    Types: Cigarettes    Start date: 10/30/1958    Quit date: 10/30/1978    Years since  quitting: 45.2   Smokeless tobacco: Never  Vaping Use   Vaping status: Never Used  Substance and Sexual Activity   Alcohol use: Yes    Comment: 1 burben and 1 beer    Drug use: No   Sexual activity: Not on file  Other Topics Concern   Not on file  Social History Narrative   Retired from [employer] at [employer]/2025   Enjoys golfing   Three daughters   Married     Right Handed    Has 2 granddaughters 1 grandson    Social Drivers of Corporate investment banker Strain: Low Risk  (01/06/2024)   Overall  Financial Resource Strain (CARDIA)    Difficulty of Paying Living Expenses: Not hard at all  Food Insecurity: No Food Insecurity (01/06/2024)   Hunger Vital Sign    Worried About Running Out of Food in the Last Year: Never true    Ran Out of Food in the Last Year: Never true  Transportation Needs: No Transportation Needs (01/06/2024)   PRAPARE - Administrator, Civil Service (Medical): No    Lack of Transportation (Non-Medical): No  Physical Activity: Insufficiently Active (01/06/2024)   Exercise Vital Sign    Days of Exercise per Week: 3 days    Minutes of Exercise per Session: 40 min  Stress: No Stress Concern Present (01/06/2024)   [employer] of Occupational Health - Occupational Stress Questionnaire    Feeling of Stress : Only a little  Social Connections: Socially Integrated (01/06/2024)   Social Connection and Isolation Panel [NHANES]    Frequency of Communication with Friends and Family: More than three times a week    Frequency of Social Gatherings with Friends and Family: Once a week    Attends Religious Services: 1 to 4 times per year    Active Member of [employer] or Organizations: Yes    Attends Banker Meetings: Never    Marital Status: Married    Tobacco Counseling Counseling given: Not Answered    Clinical Intake:  Pre-visit preparation completed: Yes  Pain : No/denies pain     BMI - recorded: 33.15 Nutritional Status:  BMI > 30  Obese Nutritional Risks: None Diabetes: Yes CBG done?: No Did pt. bring in CBG monitor from home?: No  Lab Results  Component Value Date   HGBA1C 6.1 01/01/2024   HGBA1C 6.1 08/10/2023   HGBA1C 6.1 12/31/2022     How often do you need to have someone help you when you read instructions, pamphlets, or other written materials from your doctor or pharmacy?: 1 - Never  Interpreter Needed?: No  Information entered by :: Akyah Lagrange, RMA   Activities of Daily Living     01/06/2024    8:41 AM 02/23/2023    2:25 PM  In your present state of health, do you have any difficulty performing the following activities:  Hearing? 1 0  Vision? 0 0  Difficulty concentrating or making decisions? 0 0  Walking or climbing stairs? 0 0  Dressing or bathing? 0 0  Doing errands, shopping? 0 0  Preparing Food and eating ? N N  Using the Toilet? N N  In the past six months, have you accidently leaked urine? N N  Do you have problems with loss of bowel control? N N  Managing your Medications? N N  Managing your Finances? N N  Housekeeping or managing your Housekeeping? N N    Patient Care Team: Colene Dauphin, MD as PCP - General (Internal Medicine) Arleen Lacer, MD as PCP - Cardiology (Cardiology) Clyde Darling, DO as Consulting Physician (Sports Medicine) Christina Coyer, MD as Consulting Physician (Urology) Seabron Cypress, Timmothy Foots, MD as Consulting Physician (Allergy and Immunology) Devon Fogo, MD (Inactive) as Consulting Physician (Dermatology)  I have updated your Care Teams any recent Medical Services you may have received from other providers in the past year.     Assessment:    This is a routine wellness examination for Daniel Hampton.  Hearing/Vision screen Hearing Screening - Comments:: Some hearing issues/  Vision Screening - Comments:: Has eyeglasses does not wear them much/ [facility]  Dr. Mason Sole   Goals Addressed               This Visit's  Progress     Patient Stated (pt-stated)   On track     He wants to keep his cholesterol down and stay healthy        Depression Screen     01/06/2024    9:00 AM 04/07/2023    1:05 PM 02/23/2023    3:38 PM 12/31/2022    8:46 AM 06/02/2022    1:51 PM 01/29/2022    9:33 AM 08/13/2021   10:19 AM  PHQ 2/9 Scores  PHQ - 2 Score 0 0 0 0 0 0 0  PHQ- 9 Score 1 1 0 0   0    Fall Risk     01/06/2024    8:57 AM 04/07/2023    1:05 PM 02/23/2023    3:40 PM 12/31/2022    8:45 AM 06/02/2022    1:51 PM  Fall Risk   Falls in the past year? 0 0 0 0 0  Number falls in past yr: 0 0 0 0 0  Injury with Fall? 0 0 0 0 0  Risk for fall due to : Impaired balance/gait No Fall Risks No Fall Risks No Fall Risks No Fall Risks  Risk for fall due to: Comment vertigo      Follow up Falls evaluation completed;Falls prevention discussed Falls evaluation completed;Education provided Falls evaluation completed Falls evaluation completed Falls evaluation completed    MEDICARE RISK AT HOME:  Medicare Risk at Home Any stairs in or around the home?: Yes (2 story home) If so, are there any without handrails?: Yes Home free of loose throw rugs in walkways, pet beds, electrical cords, etc?: Yes Adequate lighting in your home to reduce risk of falls?: Yes Life alert?: No Use of a cane, walker or w/c?: No Grab bars in the bathroom?: Yes Shower chair or bench in shower?: Yes Elevated toilet seat or a handicapped toilet?: Yes  TIMED UP AND GO:  Was the test performed?  No  Cognitive Function: Declined/Normal: No cognitive concerns noted by patient or family. Patient alert, oriented, able to answer questions appropriately and recall recent events. No signs of memory loss or confusion.        02/23/2023    3:40 PM  6CIT Screen  What Year? 0 points  What month? 0 points  What time? 0 points  Count back from 20 0 points  Months in reverse 0 points  Repeat phrase 0 points  Total Score 0 points     Immunizations Immunization History  Administered Date(s) Administered   Fluad Quad(high Dose 65+) 05/10/2019, 06/01/2021, 05/13/2022   Fluad Trivalent(High Dose 65+) 08/10/2023   Influenza Split 04/14/2012, 05/31/2013   Influenza Whole 08/04/2005, 07/19/2007   Influenza, High Dose Seasonal PF 06/05/2015, 07/01/2016, 05/12/2018, 05/23/2020   Influenza,inj,Quad PF,6+ Mos 06/06/2017   Influenza-Unspecified 05/04/2014, 06/05/2015   PFIZER(Purple Top)SARS-COV-2 Vaccination 08/26/2019, 09/13/2019, 06/05/2020   Pneumococcal Conjugate-13 08/08/2014   Pneumococcal Polysaccharide-23 08/04/2004, 12/31/2010, 09/29/2019   Td 08/04/2006   Tdap 08/26/2021   Zoster, Live 04/04/2012    Screening Tests Health Maintenance  Topic Date Due   FOOT EXAM  Never done   COVID-19 Vaccine (4 - 2024-25 season) 01/16/2024 (Originally 04/05/2023)   INFLUENZA VACCINE  03/04/2024   HEMOGLOBIN A1C  07/03/2024   OPHTHALMOLOGY EXAM  12/21/2024   Diabetic kidney evaluation - eGFR measurement  12/31/2024   Diabetic kidney evaluation -  Urine ACR  12/31/2024   Medicare Annual Wellness (AWV)  01/05/2025   DTaP/Tdap/Td (3 - Td or Tdap) 08/27/2031   Pneumonia Vaccine 73+ Years old  Completed   HPV VACCINES  Aged Out   Meningococcal B Vaccine  Aged Out   Colonoscopy  Discontinued   Zoster Vaccines- Shingrix  Discontinued    Health Maintenance  Health Maintenance Due  Topic Date Due   FOOT EXAM  Never done   Health Maintenance Items Addressed: Diabetic Foot Exam recommended, See Nurse Notes at the end of this note  Additional Screening:  Vision Screening: Recommended annual ophthalmology exams for early detection of glaucoma and other disorders of the eye. Would you like a referral to an eye doctor? No    Dental Screening: Recommended annual dental exams for proper oral hygiene  Community Resource Referral / Chronic Care Management: CRR required this visit?  No   CCM required this visit?   No   Plan:    I have personally reviewed and noted the following in the patient's chart:   Medical and social history Use of alcohol, tobacco or illicit drugs  Current medications and supplements including opioid prescriptions. Patient is not currently taking opioid prescriptions. Functional ability and status Nutritional status Physical activity Advanced directives List of other physicians Hospitalizations, surgeries, and ER visits in previous 12 months Vitals Screenings to include cognitive, depression, and falls Referrals and appointments  In addition, I have reviewed and discussed with patient certain preventive protocols, quality metrics, and best practice recommendations. A written personalized care plan for preventive services as well as general preventive health recommendations were provided to patient.   Emilie Carp L Owenn Rothermel, CMA   01/06/2024   After Visit Summary: (MyChart) Due to this being a telephonic visit, the after visit summary with patients personalized plan was offered to patient via MyChart   Notes: Please refer to Routing Comments.

## 2024-01-06 NOTE — Patient Instructions (Addendum)
 Daniel Hampton , Thank you for taking time out of your busy schedule to complete your Annual Wellness Visit with me. I enjoyed our conversation and look forward to speaking with you again next year. I, as well as your care team,  appreciate your ongoing commitment to your health goals. Please review the following plan we discussed and let me know if I can assist you in the future. Your Game plan/ To Do List    Follow up Visits: Next Medicare AWV with our clinical staff: 01/09/2025.   Have you seen your provider in the last 6 months (3 months if uncontrolled diabetes)? Yes Next Office Visit with your provider: 07/04/2024.  Clinician Recommendations:  Aim for 30 minutes of exercise or brisk walking, 6-8 glasses of water, and 5 servings of fruits and vegetables each day. You are due for a foot exam during your next office visit.  Keep up the good work and have a Happy birthday.      This is a list of the screening recommended for you and due dates:  Health Maintenance  Topic Date Due   Complete foot exam   Never done   COVID-19 Vaccine (4 - 2024-25 season) 01/16/2024*   Flu Shot  03/04/2024   Hemoglobin A1C  07/03/2024   Eye exam for diabetics  12/21/2024   Yearly kidney function blood test for diabetes  12/31/2024   Yearly kidney health urinalysis for diabetes  12/31/2024   Medicare Annual Wellness Visit  01/05/2025   DTaP/Tdap/Td vaccine (3 - Td or Tdap) 08/27/2031   Pneumonia Vaccine  Completed   HPV Vaccine  Aged Out   Meningitis B Vaccine  Aged Out   Colon Cancer Screening  Discontinued   Zoster (Shingles) Vaccine  Discontinued  *Topic was postponed. The date shown is not the original due date.    Advanced directives: (Copy Requested) Please bring a copy of your health care power of attorney and living will to the office to be added to your chart at your convenience. You can mail to [facility] [address] [place], [place] [address] or email to  [email or web address] Advance Care Planning is important because it:  [x]  Makes sure you receive the medical care that is consistent with your values, goals, and preferences  [x]  It provides guidance to your family and loved ones and reduces their decisional burden about whether or not they are making the right decisions based on your wishes.  Follow the link provided in your after visit summary or read over the paperwork we have mailed to you to help you started getting your Advance Directives in place. If you need assistance in completing these, please reach out to us  so that we can help you!  See attachments for Preventive Care and Fall Prevention Tips.

## 2024-01-11 ENCOUNTER — Encounter: Payer: Self-pay | Admitting: Audiology

## 2024-01-27 ENCOUNTER — Ambulatory Visit: Attending: Otolaryngology

## 2024-01-27 DIAGNOSIS — R2681 Unsteadiness on feet: Secondary | ICD-10-CM | POA: Diagnosis not present

## 2024-01-27 DIAGNOSIS — H8111 Benign paroxysmal vertigo, right ear: Secondary | ICD-10-CM | POA: Insufficient documentation

## 2024-01-27 DIAGNOSIS — R42 Dizziness and giddiness: Secondary | ICD-10-CM | POA: Diagnosis not present

## 2024-01-27 NOTE — Therapy (Signed)
 OUTPATIENT PHYSICAL THERAPY VESTIBULAR EVALUATION     Patient Name: Daniel Hampton MRN: [id number] DOB:1941-01-26, 83 y.o., male Today's Date: 01/27/2024  END OF SESSION:  PT End of Session - 01/27/24 0805     Visit Number 1    Number of Visits 13    Date for PT Re-Evaluation 02/26/24    Authorization Type BCBS medicare    PT Start Time 0803   patient late   PT Stop Time 0847    PT Time Calculation (min) 44 min    Activity Tolerance Patient tolerated treatment well    Behavior During Therapy [facility] for tasks assessed/performed          Past Medical History:  Diagnosis Date   Allergy    SEASONAL   Arthritis    Asthma    COLONIC POLYPS, HX OF 03/12/2007   DISC DISEASE, LUMBAR 12/03/2008   DIVERTICULOSIS, COLON 10/17/2009   History of kidney stones    Hydrocele    HYPERLIPIDEMIA 03/12/2007   Hyphema of right eye    Complicated by allergic conjunctivitis   Melanoma (HCC) 04/23/2020   in situ-Left Upper Arm   MORTON'S NEUROMA, RIGHT 03/13/2008   SCC (squamous cell carcinoma) 04/26/2013   right hand-txpbx   SKIN CANCER, HX OF 10/17/2009   VERTIGO, BENIGN PAROXYSMAL POSITION 04/28/2007   Past Surgical History:  Procedure Laterality Date   BACK SURGERY     COLONOSCOPY W/ BIOPSIES     CT CTA CORONARY W/CA SCORE W/CM &/OR WO/CM  01/27/2022   1. Coronary artery calcium  score 1540 Agatston units. This places the patient in the 76th percentile for age and gender, suggesting intermediate risk for future events.  2. Extensive coronary disease but appears nonobstructive. This is confirmed by CT FFR. 3. 4.2 cm dilation of the ascending aorta,   EYE SURGERY Left    LUMBAR LAMINECTOMY/ DECOMPRESSION WITH MET-RX Left 03/01/2021   Procedure: LEFT LUMBAR FOUR-FIVE MINIMALLY INVASIVE LAMINECTOMY, LATERAL RECESS DECOMPRESSION AND RESECTION OF SYNOVIAL CYST;  Surgeon: Carollee Lani BROCKS, DO;  Location: [facility] OR;  Service: Neurosurgery;  Laterality: Left;   MOHS SURGERY     SPINE SURGERY      lumbar disk surgery   Patient Active Problem List   Diagnosis Date Noted   History of melanoma 01/01/2024   Long term (current) use of systemic steroids 04/14/2023   TIA (transient ischemic attack) 04/07/2023   Dizziness 04/07/2023   Atherosclerotic vascular disease 04/07/2023   Statin myopathy 12/31/2022   Hepatitis A antibody positive 05/12/2022   Mature cataract 04/14/2022   Peripheral focal chorioretinal inflammation of both eyes 04/14/2022   Posterior synechiae (iris), 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   Retinal edema 04/14/2022   Chronic allergic conjunctivitis 04/10/2022   Gastro-esophageal reflux disease without esophagitis 04/10/2022   Coronary artery disease, non-occlusive 04/01/2022   Exercise intolerance 01/06/2022   Elevated coronary artery calcium  score 12/26/2021   Diabetes (HCC) 08/12/2021   Emphysema lung (HCC) 07/02/2021   Lung nodule seen on imaging study 07/02/2021   Chronic cough 07/02/2021   Lumbar radiculopathy L5 s/p L4-5 laminectomy 7/22 02/06/2021   Aortic atherosclerosis (HCC) 01/18/2020   DIVERTICULOSIS, COLON 10/17/2009   H/O melanoma in situ 10/17/2009   DISC DISEASE, LUMBAR 12/03/2008   Morton's neuroma of both feet 03/13/2008   VERTIGO, BENIGN PAROXYSMAL POSITION 04/28/2007   Hyperlipidemia with target LDL less than 70 03/12/2007   History of colonic polyps 03/12/2007    PCP: Glade Hope, MD REFERRING PROVIDER: Eldora Blanch, MD  REFERRING  DIAG: H81.11 (ICD-10-CM) - Benign paroxysmal positional vertigo of right ear   THERAPY DIAG:  Unsteadiness on feet - Plan: PT plan of care cert/re-cert  Dizziness and giddiness - Plan: PT plan of care cert/re-cert  ONSET DATE: 12/31/23 referral  Rationale for Evaluation and Treatment: Rehabilitation  SUBJECTIVE:   SUBJECTIVE STATEMENT: Patient is familiar to this clinic. Previously had L posterior canal canalithiasis. Per patient, the ENT told him he had vertical vertigo. Did take meclizine  last week, but not  last night.  Pt accompanied by: self  PERTINENT HISTORY: HLD, hyphema of R eye, melanoma, Vertigo  PAIN:  Are you having pain? No  PRECAUTIONS: Fall   WEIGHT BEARING RESTRICTIONS: No  FALLS: Has patient fallen in last 6 months? No   PATIENT GOALS: to not be dizzy  OBJECTIVE:  Note: Objective measures were completed at Evaluation unless otherwise noted.  DIAGNOSTIC FINDINGS: 04/07/23  IMPRESSION: 1. No acute intracranial abnormality. 2. Mild chronic microvascular ischemic disease for age.  COGNITION: Overall cognitive status: Within functional limits for tasks assessed   SENSATION: WFL  Cervical ROM:   WFL  STRENGTH: WFL  VESTIBULAR ASSESSMENT:  GENERAL OBSERVATION: NAD, carrying SPC   SYMPTOM BEHAVIOR:  Subjective history: see above  Non-Vestibular symptoms: none  Type of dizziness: Spinning/Vertigo and feels like I'm going to fall  Frequency: daily  Duration: seconds  Aggravating factors: Induced by position change: lying supine and rolling to the right and putting in his eye drops  Relieving factors: no known relieving factors  Progression of symptoms: unchanged  OCULOMOTOR EXAM:  Ocular Alignment: R ptosis  Ocular ROM: No Limitations  Spontaneous Nystagmus: absent  Gaze-Induced Nystagmus: absent   POSITIONAL TESTING: Right Dix-Hallpike: upbeating, right nystagmus Left Dix-Hallpike: to be assessed PRN Right Roll Test: R upbeating torsional nystagmus indicative of R posterior canal canalithiasis  Left Roll Test: no nystagmus  MOTION SENSITIVITY:  Motion Sensitivity Quotient Intensity: 0 = none, 1 = Lightheaded, 2 = Mild, 3 = Moderate, 4 = Severe, 5 = Vomiting  Intensity  1. Sitting to supine   2. Supine to L side   3. Supine to R side   4. Supine to sitting   5. L Hallpike-Dix   6. Up from L    7. R Hallpike-Dix   8. Up from R    9. Sitting, head tipped to L knee   10. Head up from L knee   11. Sitting, head tipped to R knee   12. Head up  from R knee   13. Sitting head turns x5   14.Sitting head nods x5   15. In stance, 180 turn to L    16. In stance, 180 turn to R                                                                                                                                TREATMENT  Canalith Repositioning:  Epley Right: Number of  Reps: 1, Response to Treatment: symptoms worsened/converted, and Comment: bed in trendelenburg, but converted to R horizontal canalithiasis and Comment: repetitive rolling with mild improvement in symptoms  Self care/home management: -pathophys of BPPV  PATIENT EDUCATION: Education details: PT POC, Exam findings, see above, repetitive rolling as tolerated at home Person educated: Patient Education method: Medical illustrator Education comprehension: verbalized understanding and needs further education  HOME EXERCISE PROGRAM: -repetitive rolling   GOALS: Goals reviewed with patient? Yes  SHORT TERM GOALS: = LTG based on PT POC length   LONG TERM GOALS: Target date: 02/28/24  Pt will be independent with final HEP for improved symptoms  Baseline: to be updated PRN Goal status: INITIAL  2.  Patient will demonstrate (-) positional testing to indicate resolution of BPPV  Baseline: R posterior vs R horizontal canalithiasis  Goal status: INITIAL  ASSESSMENT:  CLINICAL IMPRESSION: Patient is a 83 y.o. male who was seen today for physical therapy evaluation and treatment for dizziness. He was previously seen at this clinic for L posterior canal canalithiasis. Per patient, it never truly resolved and still continues to bother him. Today, he presents with R posterior canal canalithiasis with bed in trendelenburg. Treated with Epley x1 with bed in trendelenburg. Upon re-check, it had converted to R horizontal canal canalithiasis with large amplitude horizontal nystagmus. Given poor cervical ROM/guarding, PT opted to treat with repetitive rolling with mild  improvement in symptoms. He would benefit from skilled PT services to address the above mentioned deficits.    OBJECTIVE IMPAIRMENTS: decreased balance, decreased knowledge of condition, and dizziness.   ACTIVITY LIMITATIONS: bending and bed mobility  PARTICIPATION LIMITATIONS: interpersonal relationship, driving, shopping, and community activity  PERSONAL FACTORS: Age, Past/current experiences, and Time since onset of injury/illness/exacerbation are also affecting patient's functional outcome.   REHAB POTENTIAL: Fair time since onset  CLINICAL DECISION MAKING: Evolving/moderate complexity  EVALUATION COMPLEXITY: Moderate   PLAN:  PT FREQUENCY: 2-3x/ week  PT DURATION: 4 weeks  PLANNED INTERVENTIONS: 97164- PT Re-evaluation, 97750- Physical Performance Testing, 97110-Therapeutic exercises, 97530- Therapeutic activity, [id number]- Neuromuscular re-education, 97535- Self Care, 02859- Manual therapy, [phone]- Gait training, [phone]- Canalith repositioning, Patient/Family education, Balance training, Stair training, Vestibular training, and Visual/preceptual remediation/compensation  PLAN FOR NEXT SESSION: re-check R horizontal, how did repetitive rolling go?   [name], PT [name], PT, DPT, CBIS  01/27/2024, 10:10 AM

## 2024-01-29 ENCOUNTER — Ambulatory Visit: Payer: Self-pay

## 2024-01-29 DIAGNOSIS — H8111 Benign paroxysmal vertigo, right ear: Secondary | ICD-10-CM | POA: Diagnosis not present

## 2024-01-29 DIAGNOSIS — R2681 Unsteadiness on feet: Secondary | ICD-10-CM

## 2024-01-29 DIAGNOSIS — R42 Dizziness and giddiness: Secondary | ICD-10-CM

## 2024-01-29 NOTE — Therapy (Signed)
 OUTPATIENT PHYSICAL THERAPY VESTIBULAR TREATMENT     Patient Name: Daniel Hampton MRN: [id number] DOB:1940/12/24, 83 y.o., male Today's Date: 01/29/2024  END OF SESSION:  PT End of Session - 01/29/24 1532     Visit Number 2    Number of Visits 13    Date for PT Re-Evaluation 02/26/24    Authorization Type BCBS medicare    PT Start Time 1530    PT Stop Time 1558   BPPV tx   PT Time Calculation (min) 28 min    Activity Tolerance Patient tolerated treatment well    Behavior During Therapy WFL for tasks assessed/performed          Past Medical History:  Diagnosis Date   Allergy    SEASONAL   Arthritis    Asthma    COLONIC POLYPS, HX OF 03/12/2007   DISC DISEASE, LUMBAR 12/03/2008   DIVERTICULOSIS, COLON 10/17/2009   History of kidney stones    Hydrocele    HYPERLIPIDEMIA 03/12/2007   Hyphema of right eye    Complicated by allergic conjunctivitis   Melanoma (HCC) 04/23/2020   in situ-Left Upper Arm   MORTON'S NEUROMA, RIGHT 03/13/2008   SCC (squamous cell carcinoma) 04/26/2013   right hand-txpbx   SKIN CANCER, HX OF 10/17/2009   VERTIGO, BENIGN PAROXYSMAL POSITION 04/28/2007   Past Surgical History:  Procedure Laterality Date   BACK SURGERY     COLONOSCOPY W/ BIOPSIES     CT CTA CORONARY W/CA SCORE W/CM &/OR WO/CM  01/27/2022   1. Coronary artery calcium  score 1540 Agatston units. This places the patient in the 76th percentile for age and gender, suggesting intermediate risk for future events.  2. Extensive coronary disease but appears nonobstructive. This is confirmed by CT FFR. 3. 4.2 cm dilation of the ascending aorta,   EYE SURGERY Left    LUMBAR LAMINECTOMY/ DECOMPRESSION WITH MET-RX Left 03/01/2021   Procedure: LEFT LUMBAR FOUR-FIVE MINIMALLY INVASIVE LAMINECTOMY, LATERAL RECESS DECOMPRESSION AND RESECTION OF SYNOVIAL CYST;  Surgeon: Carollee Lani BROCKS, DO;  Location: [facility] OR;  Service: Neurosurgery;  Laterality: Left;   MOHS SURGERY     SPINE SURGERY     lumbar  disk surgery   Patient Active Problem List   Diagnosis Date Noted   History of melanoma 01/01/2024   Long term (current) use of systemic steroids 04/14/2023   TIA (transient ischemic attack) 04/07/2023   Dizziness 04/07/2023   Atherosclerotic vascular disease 04/07/2023   Statin myopathy 12/31/2022   Hepatitis A antibody positive 05/12/2022   Mature cataract 04/14/2022   Peripheral focal chorioretinal inflammation of both eyes 04/14/2022   Posterior synechiae (iris), 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   Retinal edema 04/14/2022   Chronic allergic conjunctivitis 04/10/2022   Gastro-esophageal reflux disease without esophagitis 04/10/2022   Coronary artery disease, non-occlusive 04/01/2022   Exercise intolerance 01/06/2022   Elevated coronary artery calcium  score 12/26/2021   Diabetes (HCC) 08/12/2021   Emphysema lung (HCC) 07/02/2021   Lung nodule seen on imaging study 07/02/2021   Chronic cough 07/02/2021   Lumbar radiculopathy L5 s/p L4-5 laminectomy 7/22 02/06/2021   Aortic atherosclerosis (HCC) 01/18/2020   DIVERTICULOSIS, COLON 10/17/2009   H/O melanoma in situ 10/17/2009   DISC DISEASE, LUMBAR 12/03/2008   Morton's neuroma of both feet 03/13/2008   VERTIGO, BENIGN PAROXYSMAL POSITION 04/28/2007   Hyperlipidemia with target LDL less than 70 03/12/2007   History of colonic polyps 03/12/2007    PCP: Glade Hope, MD REFERRING PROVIDER: Eldora Blanch, MD  REFERRING  DIAG: H81.11 (ICD-10-CM) - Benign paroxysmal positional vertigo of right ear   THERAPY DIAG:  Unsteadiness on feet  Dizziness and giddiness  ONSET DATE: 12/31/23 referral  Rationale for Evaluation and Treatment: Rehabilitation  SUBJECTIVE:   SUBJECTIVE STATEMENT: Patient reports doing better. Wednesday after session he did not feel good. Did do the repetitive rolling and on Wednesday made it worse, Thursday last night he did it again and now feels better. Denies falls. He spent all morning picking up sticks and putting  his head in a dependent position.  Pt accompanied by: self  PERTINENT HISTORY: HLD, hyphema of R eye, melanoma, Vertigo  PAIN:  Are you having pain? No  PRECAUTIONS: Fall   PATIENT GOALS: to not be dizzy  OBJECTIVE:  Note: Objective measures were completed at Evaluation unless otherwise noted.  DIAGNOSTIC FINDINGS: 04/07/23  IMPRESSION: 1. No acute intracranial abnormality. 2. Mild chronic microvascular ischemic disease for age.   VESTIBULAR ASSESSMENT:  POSITIONAL TESTING: Right Dix-Hallpike: no nystagmus Left Dix-Hallpike: upbeating, left nystagmus Right Roll Test: no nystagmus Left Roll Test: no nystagmus -required multiple re-checks of L posterior canal to observe very low amplitude, 3 immediate beats of L torsional upbeating nystagmus                                                                              TREATMENT  Canalith Repositioning:  Epley Left: Number of Reps: 1 and Response to Treatment: symptoms resolved  Self care/home management: -pathophys of BPPV -avoid putting head in dependent position  PATIENT EDUCATION: Education details: see above Person educated: Patient Education method: Medical illustrator Education comprehension: verbalized understanding and needs further education  HOME EXERCISE PROGRAM: -repetitive rolling- held for now (6/27)   GOALS: Goals reviewed with patient? Yes  SHORT TERM GOALS: = LTG based on PT POC length   LONG TERM GOALS: Target date: 02/28/24  Pt will be independent with final HEP for improved symptoms  Baseline: to be updated PRN Goal status: INITIAL  2.  Patient will demonstrate (-) positional testing to indicate resolution of BPPV  Baseline: R posterior vs R horizontal canalithiasis  Goal status: INITIAL  ASSESSMENT:  CLINICAL IMPRESSION: Patient seen for skilled PT session with emphasis on canalith repositioning. R horizontal and posterior canals now clear. Patient presenting with very low  amplitude immediate onset L torsional upbeating nystagmus fatiguing after 3 beats. Treated with Epley x1 with resolution of symptoms upon re-check. Continue POC.   OBJECTIVE IMPAIRMENTS: decreased balance, decreased knowledge of condition, and dizziness.   ACTIVITY LIMITATIONS: bending and bed mobility  PARTICIPATION LIMITATIONS: interpersonal relationship, driving, shopping, and community activity  PERSONAL FACTORS: Age, Past/current experiences, and Time since onset of injury/illness/exacerbation are also affecting patient's functional outcome.   REHAB POTENTIAL: Fair time since onset  CLINICAL DECISION MAKING: Evolving/moderate complexity  EVALUATION COMPLEXITY: Moderate   PLAN:  PT FREQUENCY: 2-3x/ week  PT DURATION: 4 weeks  PLANNED INTERVENTIONS: 97164- PT Re-evaluation, 97750- Physical Performance Testing, 97110-Therapeutic exercises, 97530- Therapeutic activity, [id number]- Neuromuscular re-education, 97535- Self Care, 02859- Manual therapy, [phone]- Gait training, [phone]- Canalith repositioning, Patient/Family education, Balance training, Stair training, Vestibular training, and Visual/preceptual remediation/compensation  PLAN FOR NEXT SESSION: re-check L posterior  [name], PT [name], PT, DPT, CBIS  01/29/2024, 4:01 PM

## 2024-01-30 ENCOUNTER — Other Ambulatory Visit (HOSPITAL_COMMUNITY): Payer: Self-pay

## 2024-02-01 ENCOUNTER — Ambulatory Visit: Payer: Self-pay

## 2024-02-01 DIAGNOSIS — R42 Dizziness and giddiness: Secondary | ICD-10-CM | POA: Diagnosis not present

## 2024-02-01 DIAGNOSIS — H8111 Benign paroxysmal vertigo, right ear: Secondary | ICD-10-CM | POA: Diagnosis not present

## 2024-02-01 DIAGNOSIS — R2681 Unsteadiness on feet: Secondary | ICD-10-CM | POA: Diagnosis not present

## 2024-02-01 NOTE — Therapy (Signed)
 OUTPATIENT PHYSICAL THERAPY VESTIBULAR TREATMENT     Patient Name: Daniel Hampton MRN: [id number] DOB:[birth year], 83 y.o., male Today's Date: 02/01/2024  END OF SESSION:  PT End of Session - 02/01/24 0929     Visit Number 3    Number of Visits 13    Date for PT Re-Evaluation 02/26/24    Authorization Type BCBS medicare    PT Start Time [id number]    PT Stop Time 1012    PT Time Calculation (min) 41 min    Activity Tolerance Patient tolerated treatment well;Other (comment)   dizziness   Behavior During Therapy WFL for tasks assessed/performed          Past Medical History:  Diagnosis Date   Allergy    SEASONAL   Arthritis    Asthma    COLONIC POLYPS, HX OF 03/12/2007   DISC DISEASE, LUMBAR 12/03/2008   DIVERTICULOSIS, COLON 10/17/2009   History of kidney stones    Hydrocele    HYPERLIPIDEMIA 03/12/2007   Hyphema of right eye    Complicated by allergic conjunctivitis   Melanoma (HCC) 04/23/2020   in situ-Left Upper Arm   MORTON'S NEUROMA, RIGHT 03/13/2008   SCC (squamous cell carcinoma) 04/26/2013   right hand-txpbx   SKIN CANCER, HX OF 10/17/2009   VERTIGO, BENIGN PAROXYSMAL POSITION 04/28/2007   Past Surgical History:  Procedure Laterality Date   BACK SURGERY     COLONOSCOPY W/ BIOPSIES     CT CTA CORONARY W/CA SCORE W/CM &/OR WO/CM  01/27/2022   1. Coronary artery calcium  score 1540 Agatston units. This places the patient in the 76th percentile for age and gender, suggesting intermediate risk for future events.  2. Extensive coronary disease but appears nonobstructive. This is confirmed by CT FFR. 3. 4.2 cm dilation of the ascending aorta,   EYE SURGERY Left    LUMBAR LAMINECTOMY/ DECOMPRESSION WITH MET-RX Left 03/01/2021   Procedure: LEFT LUMBAR FOUR-FIVE MINIMALLY INVASIVE LAMINECTOMY, LATERAL RECESS DECOMPRESSION AND RESECTION OF SYNOVIAL CYST;  Surgeon: Carollee Lani BROCKS, DO;  Location: [facility] OR;  Service: Neurosurgery;  Laterality: Left;   MOHS SURGERY     SPINE  SURGERY     lumbar disk surgery   Patient Active Problem List   Diagnosis Date Noted   History of melanoma 01/01/2024   Long term (current) use of systemic steroids 04/14/2023   TIA (transient ischemic attack) 04/07/2023   Dizziness 04/07/2023   Atherosclerotic vascular disease 04/07/2023   Statin myopathy 12/31/2022   Hepatitis A antibody positive 05/12/2022   Mature cataract 04/14/2022   Peripheral focal chorioretinal inflammation of both eyes 04/14/2022   Posterior synechiae (iris), 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   Retinal edema 04/14/2022   Chronic allergic conjunctivitis 04/10/2022   Gastro-esophageal reflux disease without esophagitis 04/10/2022   Coronary artery disease, non-occlusive 04/01/2022   Exercise intolerance 01/06/2022   Elevated coronary artery calcium  score 12/26/2021   Diabetes (HCC) 08/12/2021   Emphysema lung (HCC) 07/02/2021   Lung nodule seen on imaging study 07/02/2021   Chronic cough 07/02/2021   Lumbar radiculopathy L5 s/p L4-5 laminectomy 7/22 02/06/2021   Aortic atherosclerosis (HCC) 01/18/2020   DIVERTICULOSIS, COLON 10/17/2009   H/O melanoma in situ 10/17/2009   DISC DISEASE, LUMBAR 12/03/2008   Morton's neuroma of both feet 03/13/2008   VERTIGO, BENIGN PAROXYSMAL POSITION 04/28/2007   Hyperlipidemia with target LDL less than 70 03/12/2007   History of colonic polyps 03/12/2007    PCP: Glade Hope, MD REFERRING PROVIDER: Eldora Blanch, MD  REFERRING  DIAG: H81.11 (ICD-10-CM) - Benign paroxysmal positional vertigo of right ear   THERAPY DIAG:  Unsteadiness on feet  Dizziness and giddiness  ONSET DATE: 12/31/23 referral  Rationale for Evaluation and Treatment: Rehabilitation  SUBJECTIVE:   SUBJECTIVE STATEMENT: Patient reports doing well. Went to the [employer] [facility] yesterday and had mild dizziness when first standing up. Denies falls. Noticed mild worsening of balance in the dark.  Pt accompanied by: self  PERTINENT HISTORY: HLD, hyphema of R  eye, melanoma, Vertigo  PAIN:  Are you having pain? No  PRECAUTIONS: Fall   PATIENT GOALS: to not be dizzy  OBJECTIVE:  Note: Objective measures were completed at Evaluation unless otherwise noted.  DIAGNOSTIC FINDINGS: 04/07/23  IMPRESSION: 1. No acute intracranial abnormality. 2. Mild chronic microvascular ischemic disease for age.   VESTIBULAR ASSESSMENT:  POSITIONAL TESTING: Right Dix-Hallpike: upbeating, right nystagmus Left Dix-Hallpike: no nystagmus Right Roll Test: R torsional upbeating nystagmus indicative of R posterior Left Roll Test: no nystagmus  After Epley x1 with bed in trendelenburg, patient with conversion to R horizontal Apogeotropic nystagmus with immediate onset lasting <54min                                                                               TREATMENT  Canalith Repositioning:  Epley Right: Number of Reps: 1 and Response to Treatment: symptoms worsened/converted and Comment: repetitive rolling  Repetitive rolling:  -Apogeotropic nystagmus worse on L, so R horizontal canal canalithiasis  -patient requesting to discontinue   PATIENT EDUCATION: Education details: location of BPPV Person educated: Patient Education method: Explanation and Demonstration Education comprehension: verbalized understanding and needs further education  HOME EXERCISE PROGRAM: -repetitive rolling- held for now (6/27)   GOALS: Goals reviewed with patient? Yes  SHORT TERM GOALS: = LTG based on PT POC length   LONG TERM GOALS: Target date: 02/28/24  Pt will be independent with final HEP for improved symptoms  Baseline: to be updated PRN Goal status: INITIAL  2.  Patient will demonstrate (-) positional testing to indicate resolution of BPPV  Baseline: R posterior vs R horizontal canalithiasis  Goal status: INITIAL  ASSESSMENT:  CLINICAL IMPRESSION: Patient seen for skilled PT session with emphasis on canalith repositioning. He previously had L posterior  canal canalithiasis, successfully treated with L Epley. Today, he presents with R posterior canal canalithiasis. Treated with Epley x1 with bed in trendelenburg. Upon re-test, patient had converted to R horizontal canal canalithiasis with apogeotropic nystagmus lasting <1 min. Patient with minimal cervical extension and R rotation- attempted to be accounted for with bed in trendelenburg, but was not effective. Patient treated with multiple rounds of repetitive rolling with apogeotropic nystagmus diminishing on the R, but maintained same intensity when rolled L. Patient requesting to discontinue tx due to symptoms. Continue POC as able.   OBJECTIVE IMPAIRMENTS: decreased balance, decreased knowledge of condition, and dizziness.   ACTIVITY LIMITATIONS: bending and bed mobility  PARTICIPATION LIMITATIONS: interpersonal relationship, driving, shopping, and community activity  PERSONAL FACTORS: Age, Past/current experiences, and Time since onset of injury/illness/exacerbation are also affecting patient's functional outcome.   REHAB POTENTIAL: Fair time since onset  CLINICAL DECISION MAKING: Evolving/moderate complexity  EVALUATION COMPLEXITY: Moderate   PLAN:  PT FREQUENCY: 2-3x/ week  PT DURATION: 4 weeks  PLANNED INTERVENTIONS: 97164- PT Re-evaluation, 97750- Physical Performance Testing, 97110-Therapeutic exercises, 97530- Therapeutic activity, 97112- Neuromuscular re-education, 97535- Self Care, 02859- Manual therapy, [phone]- Gait training, [phone]- Canalith repositioning, Patient/Family education, Balance training, Stair training, Vestibular training, and Visual/preceptual remediation/compensation  PLAN FOR NEXT SESSION: re-check R horizontal    Daniel Hampton, PT Daniel Hampton, PT, DPT, CBIS  02/01/2024, 10:34 AM

## 2024-02-02 ENCOUNTER — Encounter: Payer: Self-pay | Admitting: Physical Therapy

## 2024-02-03 ENCOUNTER — Ambulatory Visit: Payer: Self-pay | Attending: Otolaryngology

## 2024-02-03 DIAGNOSIS — R2681 Unsteadiness on feet: Secondary | ICD-10-CM | POA: Insufficient documentation

## 2024-02-03 DIAGNOSIS — R42 Dizziness and giddiness: Secondary | ICD-10-CM | POA: Insufficient documentation

## 2024-02-03 NOTE — Therapy (Signed)
 OUTPATIENT PHYSICAL THERAPY VESTIBULAR TREATMENT     Patient Name: Daniel Hampton MRN: [id number] DOB:[birth year], 83 y.o., male Today's Date: 02/03/2024  END OF SESSION:  PT End of Session - 02/03/24 1355     Visit Number 4    Number of Visits 13    Date for PT Re-Evaluation 02/26/24    Authorization Type [employer] medicare    PT Start Time 1356    PT Stop Time 1434    PT Time Calculation (min) 38 min    Activity Tolerance Patient tolerated treatment well    Behavior During Therapy WFL for tasks assessed/performed          Past Medical History:  Diagnosis Date   Allergy    SEASONAL   Arthritis    Asthma    COLONIC POLYPS, HX OF 03/12/2007   DISC DISEASE, LUMBAR 12/03/2008   DIVERTICULOSIS, COLON 10/17/2009   History of kidney stones    Hydrocele    HYPERLIPIDEMIA 03/12/2007   Hyphema of right eye    Complicated by allergic conjunctivitis   Melanoma (HCC) 04/23/2020   in situ-Left Upper Arm   MORTON'S NEUROMA, RIGHT 03/13/2008   SCC (squamous cell carcinoma) 04/26/2013   right hand-txpbx   SKIN CANCER, HX OF 10/17/2009   VERTIGO, BENIGN PAROXYSMAL POSITION 04/28/2007   Past Surgical History:  Procedure Laterality Date   BACK SURGERY     COLONOSCOPY W/ BIOPSIES     CT CTA CORONARY W/CA SCORE W/CM &/OR WO/CM  01/27/2022   1. Coronary artery calcium  score 1540 Agatston units. This places the patient in the 76th percentile for age and gender, suggesting intermediate risk for future events.  2. Extensive coronary disease but appears nonobstructive. This is confirmed by CT FFR. 3. 4.2 cm dilation of the ascending aorta,   EYE SURGERY Left    LUMBAR LAMINECTOMY/ DECOMPRESSION WITH MET-RX Left 03/01/2021   Procedure: LEFT LUMBAR FOUR-FIVE MINIMALLY INVASIVE LAMINECTOMY, LATERAL RECESS DECOMPRESSION AND RESECTION OF SYNOVIAL CYST;  Surgeon: Carollee Lani BROCKS, DO;  Location: [facility] OR;  Service: Neurosurgery;  Laterality: Left;   MOHS SURGERY     SPINE SURGERY     lumbar disk surgery    Patient Active Problem List   Diagnosis Date Noted   History of melanoma 01/01/2024   Long term (current) use of systemic steroids 04/14/2023   TIA (transient ischemic attack) 04/07/2023   Dizziness 04/07/2023   Atherosclerotic vascular disease 04/07/2023   Statin myopathy 12/31/2022   Hepatitis A antibody positive 05/12/2022   Mature cataract 04/14/2022   Peripheral focal chorioretinal inflammation of both eyes 04/14/2022   Posterior synechiae (iris), 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   Retinal edema 04/14/2022   Chronic allergic conjunctivitis 04/10/2022   Gastro-esophageal reflux disease without esophagitis 04/10/2022   Coronary artery disease, non-occlusive 04/01/2022   Exercise intolerance 01/06/2022   Elevated coronary artery calcium  score 12/26/2021   Diabetes (HCC) 08/12/2021   Emphysema lung (HCC) 07/02/2021   Lung nodule seen on imaging study 07/02/2021   Chronic cough 07/02/2021   Lumbar radiculopathy L5 s/p L4-5 laminectomy 7/22 02/06/2021   Aortic atherosclerosis (HCC) 01/18/2020   DIVERTICULOSIS, COLON 10/17/2009   H/O melanoma in situ 10/17/2009   DISC DISEASE, LUMBAR 12/03/2008   Morton's neuroma of both feet 03/13/2008   VERTIGO, BENIGN PAROXYSMAL POSITION 04/28/2007   Hyperlipidemia with target LDL less than 70 03/12/2007   History of colonic polyps 03/12/2007    PCP: Glade Hope, MD REFERRING PROVIDER: Eldora Blanch, MD  REFERRING DIAG: H81.11 (ICD-10-CM) -  Benign paroxysmal positional vertigo of right ear   THERAPY DIAG:  Unsteadiness on feet  Dizziness and giddiness  ONSET DATE: 12/31/23 referral  Rationale for Evaluation and Treatment: Rehabilitation  SUBJECTIVE:   SUBJECTIVE STATEMENT: Patient arrives to clinic with wife. Reports having impaired balance now and some motion sensitivity. Did roll over in bed this morning and did not reproduce his symptoms. Denies falls.  Pt accompanied by: self  PERTINENT HISTORY: HLD, hyphema of R eye, melanoma,  Vertigo  PAIN:  Are you having pain? No  PRECAUTIONS: Fall   PATIENT GOALS: to not be dizzy  OBJECTIVE:  Note: Objective measures were completed at Evaluation unless otherwise noted.  DIAGNOSTIC FINDINGS: 04/07/23  IMPRESSION: 1. No acute intracranial abnormality. 2. Mild chronic microvascular ischemic disease for age.   VESTIBULAR ASSESSMENT:  POSITIONAL TESTING: Right Dix-Hallpike: no nystagmus Left Dix-Hallpike: no nystagmus Right Roll Test: no nystagmus Left Roll Test: no nystagmus                                                                               TREATMENT Self care/ home management:  -review of findings -possible resultant hypofunction (?)  -principles of balance (somatosensory, vision and vestibular)  -wearing R contact to effect depth perception   PATIENT EDUCATION: Education details: see above, PT POC Person educated: Patient Education method: Explanation and Demonstration Education comprehension: verbalized understanding and needs further education  HOME EXERCISE PROGRAM: -repetitive rolling- held for now (6/27)   GOALS: Goals reviewed with patient? Yes  SHORT TERM GOALS: = LTG based on PT POC length   LONG TERM GOALS: Target date: 02/28/24  Pt will be independent with final HEP for improved symptoms  Baseline: to be updated PRN Goal status: INITIAL  2.  Patient will demonstrate (-) positional testing to indicate resolution of BPPV  Baseline: R posterior vs R horizontal canalithiasis  Goal status: INITIAL  ASSESSMENT:  CLINICAL IMPRESSION: Patient seen for skilled PT session with emphasis on BPPV re-assessment and patient education. Currently presents clear of BPPV with negative findings for all 4 canals assessed. Does have resultant unsteadiness. PT holding off on testing HIT to assess for hypofunction given patients current subjective reports of dizziness and instability. He could possibly have a resultant vestibular hypofunction, but  could also simply be recovering from prolonged BPPV. Education patient and wife on such. Explained importance of maximizing vision given eye dx to improve his stability and possibly impact his dizziness. Continue POC.   OBJECTIVE IMPAIRMENTS: decreased balance, decreased knowledge of condition, and dizziness.   ACTIVITY LIMITATIONS: bending and bed mobility  PARTICIPATION LIMITATIONS: interpersonal relationship, driving, shopping, and community activity  PERSONAL FACTORS: Age, Past/current experiences, and Time since onset of injury/illness/exacerbation are also affecting patient's functional outcome.   REHAB POTENTIAL: Fair time since onset  CLINICAL DECISION MAKING: Evolving/moderate complexity  EVALUATION COMPLEXITY: Moderate   PLAN:  PT FREQUENCY: 2-3x/ week  PT DURATION: 4 weeks  PLANNED INTERVENTIONS: 97164- PT Re-evaluation, 97750- Physical Performance Testing, 97110-Therapeutic exercises, 97530- Therapeutic activity, [id number]- Neuromuscular re-education, 97535- Self Care, [id number]- Manual therapy, [phone]- Gait training, [phone]- Canalith repositioning, Patient/Family education, Balance training, Stair training, Vestibular training, and Visual/preceptual remediation/compensation  PLAN FOR NEXT SESSION: still dizzy/  unstable? Maybe do HIT and try VOR exercises? Otherwise- recheck everything    [name], PT [name], PT, DPT, CBIS  02/03/2024, 2:53 PM

## 2024-02-08 ENCOUNTER — Encounter: Payer: Medicare Other | Admitting: Internal Medicine

## 2024-02-12 ENCOUNTER — Ambulatory Visit: Payer: Self-pay | Admitting: Physical Therapy

## 2024-02-19 NOTE — Therapy (Signed)
 [facility] [address] [place], [place], [address] Phone: [phone]   Fax:  [phone]  Patient Details  Name: Daniel Hampton MRN: [id number] Date of Birth: 1941-06-09 Referring Provider:  No ref. provider found  Encounter Date: 02/19/2024  PHYSICAL THERAPY DISCHARGE SUMMARY  Visits from Start of Care: 4  Current functional level related to goals / functional outcomes: Patient self-reports resolution of BPPV   Remaining deficits: Patient self-reports resolution of BPPV   Education / Equipment: PT POC, pathophys of BPPV   Patient agrees to discharge. Patient goals were met. Patient is being discharged due to the patient's request.  [name], PT [name], PT, DPT, CBIS  02/19/2024, 3:20 PM  [facility] [address] [place], [place], [address] Phone: [phone]   Fax:  [phone]

## 2024-04-07 ENCOUNTER — Encounter: Payer: Self-pay | Admitting: Cardiology

## 2024-04-22 ENCOUNTER — Other Ambulatory Visit (HOSPITAL_COMMUNITY): Payer: Self-pay

## 2024-04-22 DIAGNOSIS — H16001 Unspecified corneal ulcer, right eye: Secondary | ICD-10-CM | POA: Diagnosis not present

## 2024-04-22 DIAGNOSIS — Z961 Presence of intraocular lens: Secondary | ICD-10-CM | POA: Diagnosis not present

## 2024-04-22 DIAGNOSIS — H3581 Retinal edema: Secondary | ICD-10-CM | POA: Diagnosis not present

## 2024-04-22 DIAGNOSIS — Z79899 Other long term (current) drug therapy: Secondary | ICD-10-CM | POA: Diagnosis not present

## 2024-04-22 DIAGNOSIS — H30033 Focal chorioretinal inflammation, peripheral, bilateral: Secondary | ICD-10-CM | POA: Diagnosis not present

## 2024-04-22 DIAGNOSIS — S0501XS Injury of conjunctiva and corneal abrasion without foreign body, right eye, sequela: Secondary | ICD-10-CM | POA: Diagnosis not present

## 2024-04-22 DIAGNOSIS — H21541 Posterior synechiae (iris), right eye: Secondary | ICD-10-CM | POA: Diagnosis not present

## 2024-04-22 MED ORDER — FLUOROMETHOLONE 0.1 % OP SUSP
1.0000 [drp] | Freq: Every day | OPHTHALMIC | 4 refills | Status: DC
  Filled 2024-04-22: qty 5, 30d supply, fill #0

## 2024-04-25 ENCOUNTER — Telehealth: Payer: Self-pay

## 2024-04-25 NOTE — Telephone Encounter (Signed)
 Copied from CRM #[id number]. Topic: Clinical - Medical Advice >> Apr 25, 2024 10:12 AM Chiquita SQUIBB wrote: Reason for CRM: Patient is getting over a cold and can't get rid of cough and congestion he can't get up since last Wednesday. Patient does not want to come in and get anyone else sick. Patient is asking if something can be prescribed for him to [facility]. Patient is asking for a call or mychart message.

## 2024-04-27 ENCOUNTER — Encounter: Payer: Self-pay | Admitting: Internal Medicine

## 2024-04-27 ENCOUNTER — Other Ambulatory Visit (HOSPITAL_COMMUNITY): Payer: Self-pay

## 2024-04-27 ENCOUNTER — Ambulatory Visit (INDEPENDENT_AMBULATORY_CARE_PROVIDER_SITE_OTHER): Admitting: Internal Medicine

## 2024-04-27 VITALS — BP 128/70 | HR 77 | Temp 98.0°F | Ht 70.0 in | Wt 237.0 lb

## 2024-04-27 DIAGNOSIS — J069 Acute upper respiratory infection, unspecified: Secondary | ICD-10-CM | POA: Insufficient documentation

## 2024-04-27 MED ORDER — HYDROCODONE BIT-HOMATROP MBR 5-1.5 MG/5ML PO SOLN
5.0000 mL | Freq: Three times a day (TID) | ORAL | 0 refills | Status: DC | PRN
  Filled 2024-04-27: qty 120, 8d supply, fill #0

## 2024-04-27 NOTE — Progress Notes (Signed)
 Subjective:    Patient ID: Daniel Hampton, male    DOB: 12-17-1940, 83 y.o.   MRN: [id number]      HPI Daniel Hampton is here for  Chief Complaint  Patient presents with   Cough    Started coughing last Thursday; Started feeling better yesterday and feels like cough is settling down in his chest; Back and ribs hurt from coughing.    Discussed the use of AI scribe software for clinical note transcription with the patient, who gave verbal consent to proceed.  History of Present Illness Daniel Hampton is an 83 year old male who presents with a persistent cough and cold symptoms.  He began experiencing symptoms approximately six days ago after dining at a crowded restaurant. Initially, he had a mild cough that progressively worsened, particularly from Wednesday through Sunday. Despite his symptoms, he attended a wedding on Saturday. The cough is persistent and difficult to control, with episodes where he feels unable to stop coughing. Over-the-counter medications like Delsym and Mucinex have not provided relief, but he finds some relief with a tablespoon of honey.  No sputum production with the cough, and it remains non-productive. No fever, chills, shortness of breath, wheezing, nasal congestion, sinus pain, or pressure. He initially experienced a sore throat, which has since resolved. No significant body aches or other symptoms.  He mentions having a headache one day, which he attributes to adjusting to new hearing aids. He denies any issues with appetite and states he has been eating normally.        Medications and allergies reviewed with patient and updated if appropriate.  Current Outpatient Medications on File Prior to Visit  Medication Sig Dispense Refill   Cetirizine  HCl (ZYRTEC  PO) Take by mouth.     Coenzyme Q10 (COQ-10 PO) Take 1 tablet by mouth daily.     desonide  (DESOWEN ) 0.05 % cream Apply a small amount to affected area twice a day. Can use as needed on face. 15 g 2    erythromycin  ophthalmic ointment Apply to left eyelid and eyeball at night or as needed for discomfort 3.5 g 11   Evolocumab  (REPATHA  SURECLICK) 140 MG/ML SOAJ Inject 1 ML (140 MG) into the skin every 14 days 6 mL 3   famotidine  (PEPCID ) 20 MG tablet Take 20 mg by mouth 2 (two) times daily.     fluorometholone  (FML) 0.1 % ophthalmic suspension Place 1 drop into the right eye daily. 5 mL 4   hydroxyprophyl methylcellulose / hypromellose (ISOPTO TEARS) 0.5 % opthalmic solution Apply 1 drop to eye.     meclizine  (ANTIVERT ) 25 MG tablet Take 0.5-1 tablets (12.5-25 mg total) by mouth 3 (three) times daily as needed for dizziness. 30 tablet 2   Multiple Vitamins-Minerals (ALIVE MULTI-VITAMIN PO) Take 1 tablet by mouth daily.     mycophenolate  (CELLCEPT ) 250 MG capsule Take 2 capsules (500 mg total) by mouth 2 (two) times daily. 360 capsule 5   Olopatadine HCl (PATADAY) 0.2 % SOLN 1 drop into affected eye Ophthalmic-OTC Once a day as needed; Duration: 30 days     prednisoLONE  acetate (PRED FORTE ) 1 % ophthalmic suspension Place 1 drop into the right eye daily.     timolol  (TIMOPTIC ) 0.5 % ophthalmic solution Administer 1 drop into each eyes 2 (two) times a day. 10 mL 11   valACYclovir  (VALTREX ) 1000 MG tablet Take 1 tablet (1,000 mg total) by mouth daily. 90 tablet 3   No current facility-administered medications on file  prior to visit.    Review of Systems  Constitutional:  Negative for appetite change, chills and fever.  HENT:  Negative for congestion, ear pain, sinus pain and sore throat (resolved).   Respiratory:  Positive for cough (dry). Negative for shortness of breath and wheezing.   Neurological:  Negative for light-headedness and headaches.       Objective:   Vitals:   04/27/24 1538  BP: 128/70  Pulse: 77  Temp: 98 F (36.7 C)  SpO2: 96%   BP Readings from Last 3 Encounters:  04/27/24 128/70  01/01/24 136/82  12/31/23 (!) 148/81   Wt Readings from Last 3 Encounters:   04/27/24 237 lb (107.5 kg)  01/06/24 231 lb (104.8 kg)  01/01/24 231 lb (104.8 kg)   Body mass index is 34.01 kg/m.    Physical Exam Constitutional:      General: He is not in acute distress.    Appearance: Normal appearance. He is not ill-appearing.  HENT:     Head: Normocephalic.     Right Ear: Tympanic membrane, ear canal and external ear normal. There is no impacted cerumen.     Left Ear: Tympanic membrane, ear canal and external ear normal. There is no impacted cerumen.     Mouth/Throat:     Mouth: Mucous membranes are moist.     Pharynx: No oropharyngeal exudate or posterior oropharyngeal erythema.  Eyes:     Conjunctiva/sclera: Conjunctivae normal.  Cardiovascular:     Rate and Rhythm: Normal rate and regular rhythm.  Pulmonary:     Effort: Pulmonary effort is normal. No respiratory distress.     Breath sounds: Normal breath sounds. No wheezing or rales.  Musculoskeletal:     Cervical back: Neck supple. No tenderness.  Lymphadenopathy:     Cervical: No cervical adenopathy.  Skin:    General: Skin is warm and dry.     Findings: No rash.  Neurological:     Mental Status: He is alert.            Assessment & Plan:    Assessment and Plan Assessment & Plan Acute viral upper respiratory infection with persistent cough Persistent cough for six days, likely viral. No fever, chills, or respiratory distress. Cough improving but significant. Antibiotics not recommended unless symptoms worsen. - Monitor symptoms. Contact if symptoms worsen for potential antibiotics. - Prescribed cough syrup with hydrocodone  for nighttime relief. - otc cold medications as needed - call if symptoms are not continuing to improve or worsen

## 2024-04-27 NOTE — Patient Instructions (Addendum)
        Medications changes include :   hycodan cough syrup.  Over the counter cold medications for symptoms relief      Return if symptoms worsen or fail to improve.

## 2024-05-16 DIAGNOSIS — R972 Elevated prostate specific antigen [PSA]: Secondary | ICD-10-CM | POA: Diagnosis not present

## 2024-05-19 DIAGNOSIS — L720 Epidermal cyst: Secondary | ICD-10-CM | POA: Diagnosis not present

## 2024-05-19 DIAGNOSIS — L821 Other seborrheic keratosis: Secondary | ICD-10-CM | POA: Diagnosis not present

## 2024-05-19 DIAGNOSIS — B078 Other viral warts: Secondary | ICD-10-CM | POA: Diagnosis not present

## 2024-05-19 DIAGNOSIS — L820 Inflamed seborrheic keratosis: Secondary | ICD-10-CM | POA: Diagnosis not present

## 2024-05-23 DIAGNOSIS — N5201 Erectile dysfunction due to arterial insufficiency: Secondary | ICD-10-CM | POA: Diagnosis not present

## 2024-05-23 DIAGNOSIS — R972 Elevated prostate specific antigen [PSA]: Secondary | ICD-10-CM | POA: Diagnosis not present

## 2024-05-23 DIAGNOSIS — N401 Enlarged prostate with lower urinary tract symptoms: Secondary | ICD-10-CM | POA: Diagnosis not present

## 2024-05-31 ENCOUNTER — Other Ambulatory Visit (HOSPITAL_COMMUNITY): Payer: Self-pay

## 2024-05-31 DIAGNOSIS — H21541 Posterior synechiae (iris), right eye: Secondary | ICD-10-CM | POA: Diagnosis not present

## 2024-05-31 DIAGNOSIS — H3581 Retinal edema: Secondary | ICD-10-CM | POA: Diagnosis not present

## 2024-05-31 DIAGNOSIS — Z961 Presence of intraocular lens: Secondary | ICD-10-CM | POA: Diagnosis not present

## 2024-05-31 DIAGNOSIS — Z79899 Other long term (current) drug therapy: Secondary | ICD-10-CM | POA: Diagnosis not present

## 2024-05-31 DIAGNOSIS — H30033 Focal chorioretinal inflammation, peripheral, bilateral: Secondary | ICD-10-CM | POA: Diagnosis not present

## 2024-05-31 MED ORDER — METHOTREXATE SODIUM 2.5 MG PO TABS
ORAL_TABLET | ORAL | 3 refills | Status: AC
  Filled 2024-05-31: qty 36, 84d supply, fill #0

## 2024-05-31 MED ORDER — VALACYCLOVIR HCL 1 G PO TABS
1000.0000 mg | ORAL_TABLET | Freq: Every day | ORAL | 3 refills | Status: AC
  Filled 2024-05-31: qty 90, 90d supply, fill #0

## 2024-05-31 MED ORDER — FOLIC ACID 1 MG PO TABS
1.0000 mg | ORAL_TABLET | Freq: Every day | ORAL | 3 refills | Status: AC
  Filled 2024-05-31: qty 90, 90d supply, fill #0

## 2024-06-06 ENCOUNTER — Ambulatory Visit: Attending: Cardiology | Admitting: Cardiology

## 2024-06-06 ENCOUNTER — Encounter: Payer: Self-pay | Admitting: Cardiology

## 2024-06-06 VITALS — BP 138/78 | HR 58 | Ht 70.0 in | Wt 238.0 lb

## 2024-06-06 DIAGNOSIS — R2689 Other abnormalities of gait and mobility: Secondary | ICD-10-CM | POA: Diagnosis not present

## 2024-06-06 DIAGNOSIS — I2510 Atherosclerotic heart disease of native coronary artery without angina pectoris: Secondary | ICD-10-CM

## 2024-06-06 DIAGNOSIS — E785 Hyperlipidemia, unspecified: Secondary | ICD-10-CM

## 2024-06-06 MED ORDER — ASPIRIN 81 MG PO TBEC: DELAYED_RELEASE_TABLET | ORAL | Status: AC

## 2024-06-06 NOTE — Patient Instructions (Addendum)
 Medication Instructions:    Restart taking Enteric Coated Aspirin  81 mg  4 times a week   *If you need a refill on your cardiac medications before your next appointment, please call your pharmacy*   Lab Work: Not needed    Testing/Procedures:  Not needed  Follow-Up: At [facility], you and your health needs are our priority.  As part of our continuing mission to provide you with exceptional heart care, we have created designated Provider Care Teams.  These Care Teams include your primary Cardiologist (physician) and Advanced Practice Providers (APPs -  Physician Assistants and Nurse Practitioners) who all work together to provide you with the care you need, when you need it.     Your next appointment:   12 month(s)  The format for your next appointment:   In Person  Provider:   Alm Clay, MD

## 2024-06-06 NOTE — Progress Notes (Unsigned)
 Cardiology Office Note:  .   Date:  06/09/2024  ID:  Masaru, Chamberlin 05/12/1941, MRN [id number] PCP: Geofm Glade PARAS, MD  [facility] Providers Cardiologist:  Alm Clay, MD     Chief Complaint  Patient presents with   Follow-up    Somewhat delayed annual follow-up.  Plan-6 months   Coronary Artery Disease    Significant coronary calcification noted on CT scan but no angina.    Patient Profile: .     [place] Daniel Hampton is a mildly obese but otherwise healthy 83 y.o. male with a PMH noted below who presents here for annual follow-up.  Referred and following up at the request of Geofm Glade PARAS, MD.  Notable PMH Nonobstructive coronary artery disease. Coronary CTA with FFR 01/24/2022: Calcium  score 1540 (76 percentile). Calcified plaque distal LM.  Extensive calcified plaque proximal LAD.  Calcified plaque proximal LCx.  Diffuse mixed plaque throughout RCA, PDA, PLV.  All stenosis appears mild.  FFR did not show any significant stenosis.  Ascending thoracic aortic dilatation. CTA 01/2022: Stable dilatation ascending thoracic aorta 4.2 cm. CTA 12/22/2022: Patient is escorted measured at 4.2 cm; stable on CT Chest 07/2023 Hyperlipidemia. Lipid panel 06/02/2022: LDL -14, HDL 43, TG 177, total 64. On Repatha  +5 mg rosuvastatin  can think Emphysema. Anterior uveitis. 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  T2DM. Diet controlled.  ER visit 04/10/2023 for dizziness.  Woke up at 5 AM with regular activity.  Drank coffee but felt dizzy about 10 AM. .  Concern for stroke.  Ruled out for CVA; Given Rx of Vertigo.      I saw BRENTLEE DELAGE for follow-up from the hospital visit 05/03/2023.  He was doing relatively well.  Noted to be in [place] shape since he had not been playing golf.  No otherwise no symptoms.  He had 5-6 associated with dehydration.  Noted statin myopathy.  Had been on Repatha .  The plan was for 30-month follow-up but he returns for annual.  Subjective  Discussed the  use of AI scribe software for clinical note transcription with the patient, who gave verbal consent to proceed.  History of Present Illness MEDARD DECUIR is an 83 year old male who presents with balance issues.  He has been experiencing balance issues since last September following a fall while getting up to go to the bathroom. An emergency room evaluation led to a diagnosis of vertigo after an MRI. Despite attending approximately a dozen rehabilitation sessions, he continues to experience balance problems without dizziness. He describes the sensation as 'things just kind of move' when he stands up, but not as room spinning. He uses a cane for stability, especially in crowded places like football games, and finds it helpful.  He has a history of eye operations and has been experiencing hallucinations, such as seeing a man standing over him or a woman coming out of his bathroom. He attributes these to his eye medications, which include mycophenolic and Valtrex . He is starting a new medication regimen for his eye condition.  He takes Repatha  for cholesterol management and reports his cholesterol levels are well-controlled. He also takes vitamins, CoQ10, and fish oil . He does not take any medications for blood pressure, which is borderline high, and he has stopped taking aspirin  after taking it for thirty years.  He mentions a history of a TIA or similar event, but it was later thought to be vertigo. He is concerned about falling, as he knows many people  who have fallen and hurt themselves. He experiences cramping in his legs, particularly when sitting for extended periods, such as at a ball game.  His walking has slowed down, and he gets tired more easily, particularly in his legs. He used to walk two miles without issue but now needs to take breaks. He has a history of aortic aneurysm monitoring, with a measurement of 4.1 cm on a CT scan from December last year. No chest pain, pressure, tightness, heart  racing, skipping, flip-flopping, dizziness, or feeling woozy. Reports feeling wobbly and having balance issues. No issues with passing out.    Objective   Current Meds Repatha  140 mg every 2 weeks CoQ 10 1 tab daily  Medication Sig   Cetirizine  HCl (ZYRTEC  PO) Take by mouth.   Coenzyme Q10 (COQ-10 PO) Take 1 tablet by mouth daily.   desonide  (DESOWEN ) 0.05 % cream Apply a small amount to affected area twice a day. Can use as needed on face.   erythromycin  ophthalmic ointment Apply to left eyelid and eyeball at night or as needed for discomfort   Evolocumab  (REPATHA  SURECLICK) 140 MG/ML SOAJ Inject 1 ML (140 MG) into the skin every 14 days   famotidine  (PEPCID ) 20 MG tablet Take 20 mg by mouth 2 (two) times daily.   fluorometholone  (FML) 0.1 % ophthalmic suspension Place 1 drop into the right eye daily.   folic acid (FOLVITE) 1 MG tablet Take 1 tablet (1 mg total) by mouth daily.   HYDROcodone  bit-homatropine (HYCODAN) 5-1.5 MG/5ML syrup Take 5 mLs by mouth every 8 (eight) hours as needed for cough.   methotrexate (RHEUMATREX) 2.5 MG tablet Take 3 tablets (7.5 mg total) by mouth once a week. Take exactly as directed by prescriber.   Multiple Vitamins-Minerals (ALIVE MULTI-VITAMIN PO) Take 1 tablet by mouth daily.   Olopatadine HCl (PATADAY) 0.2 % SOLN 1 drop into affected eye Ophthalmic-OTC Once a day as needed; Duration: 30 days   valACYclovir  (VALTREX ) 1000 MG tablet Take 1 tablet (1,000 mg total) by mouth daily.    Studies Reviewed: SABRA   EKG Interpretation Date/Time:  Monday June 06 2024 10:03:29 EST Ventricular Rate:  58 PR Interval:  166 QRS Duration:  90 QT Interval:  424 QTC Calculation: 416 R Axis:   61  Text Interpretation: Sinus bradycardia When compared with ECG of 07-Apr-2023 13:52, No significant change since last tracing Premature atrial complexes NO LONGER PRESENT Confirmed by Anner Lenis ([id number]) on 06/06/2024 10:30:16 AM    Results LABS Total cholesterol: 1.3  (01/01/2024) HDL: 52 (01/01/2024) LDL: 40 (01/01/2024) Triglycerides: 143 (01/01/2024) A1c: 6.1 (01/01/2024) Creatinine: 1.01 (01/01/2024) Potassium: 4.4 (01/01/2024) Platelets: 216 (01/01/2024) Creatinine: 1.07 (05/2024) Glucose: 70 (05/2024) Potassium: 4.2 (05/2024)  RADIOLOGY Chest CT: Aorta stable at 4.1 cm (07/2023)  DIAGNOSTIC EKG: Normal  Risk Assessment/Calculations:         Physical Exam:   VS:  BP 138/78 (BP Location: Right Arm, Patient Position: Sitting, Cuff Size: Large)   Pulse (!) 58   Ht 5' 10 (1.778 m)   Wt 238 lb (108 kg)   SpO2 94%   BMI 34.15 kg/m    Wt Readings from Last 3 Encounters:  06/06/24 238 lb (108 kg)  04/27/24 237 lb (107.5 kg)  01/06/24 231 lb (104.8 kg)     GEN: Well nourished, well groomed in no acute distress; mild to mildly obese NECK: No JVD; No carotid bruits CARDIAC: RRR, Normal S1, S2; no murmurs, rubs, gallops RESPIRATORY:  Clear to auscultation  without rales, wheezing or rhonchi ; nonlabored, good air movement. ABDOMEN: Soft, non-tender, non-distended EXTREMITIES:  No edema; No deformity     ASSESSMENT AND PLAN: .    Problem List Items Addressed This Visit       Cardiology Problems   Coronary artery disease, non-occlusive - Primary (Chronic)   Atherosclerotic heart disease without angina symptoms.  Previous EKG stable.  Blood pressure borderline high, aggressive treatment not recommended to avoid balance issues. TIA considered but not confirmed.  Reintroducing aspirin  considered for cardiovascular protection, balancing fall risk. - Reintroduce aspirin  four days a week for cardiovascular protection. - Continue Repatha  for lipid management - Closely monitor blood pressure.  He has not required treatment before.  Low threshold to initiate therapy. - Continue recommend exercise      Relevant Medications   aspirin  EC 81 MG tablet   Other Relevant Orders   EKG 12-Lead (Completed)   Hyperlipidemia with target LDL less  than 70 (Chronic)   Intolerant of atorvastatin  simvastatin  and even rosuvastatin .  Now on Repatha  with well-controlled lipids.  LDL excellent at 40. - Continue Repatha  140 mg weekly      Relevant Medications   aspirin  EC 81 MG tablet   Other Relevant Orders   EKG 12-Lead (Completed)     Other   Poor balance   Chronic balance impairment with instability when standing or walking, without dizziness or vertigo. Fall risk present. Previous evaluations suggest vertigo, but symptoms do not align. Balance exercises and rehabilitation provide temporary relief. Cane use beneficial. Blood pressure borderline high, preferable to avoid exacerbating balance issues. - Continue using a cane for stability. - Monitor blood pressure to ensure it remains stable and not too low. - Consider balance exercises at home to maintain stability.            Follow-Up: Return in about 1 year (around 06/06/2025).    Signed, Alm MICAEL Clay, MD, MS Alm Clay, M.D., M.S. Interventional Cardiologist  [facility] Pager # [phone]

## 2024-06-07 DIAGNOSIS — H02403 Unspecified ptosis of bilateral eyelids: Secondary | ICD-10-CM | POA: Diagnosis not present

## 2024-06-07 DIAGNOSIS — H02831 Dermatochalasis of right upper eyelid: Secondary | ICD-10-CM | POA: Diagnosis not present

## 2024-06-07 DIAGNOSIS — H02834 Dermatochalasis of left upper eyelid: Secondary | ICD-10-CM | POA: Diagnosis not present

## 2024-06-07 DIAGNOSIS — Z973 Presence of spectacles and contact lenses: Secondary | ICD-10-CM | POA: Diagnosis not present

## 2024-06-07 DIAGNOSIS — H179 Unspecified corneal scar and opacity: Secondary | ICD-10-CM | POA: Diagnosis not present

## 2024-06-09 ENCOUNTER — Encounter: Payer: Self-pay | Admitting: Cardiology

## 2024-06-09 DIAGNOSIS — R2689 Other abnormalities of gait and mobility: Secondary | ICD-10-CM | POA: Insufficient documentation

## 2024-06-09 NOTE — Assessment & Plan Note (Signed)
 Chronic balance impairment with instability when standing or walking, without dizziness or vertigo. Fall risk present. Previous evaluations suggest vertigo, but symptoms do not align. Balance exercises and rehabilitation provide temporary relief. Cane use beneficial. Blood pressure borderline high, preferable to avoid exacerbating balance issues. - Continue using a cane for stability. - Monitor blood pressure to ensure it remains stable and not too low. - Consider balance exercises at home to maintain stability.

## 2024-06-09 NOTE — Assessment & Plan Note (Addendum)
 Intolerant of atorvastatin  simvastatin  and even rosuvastatin .  Now on Repatha  with well-controlled lipids.  LDL excellent at 40. - Continue Repatha  140 mg weekly

## 2024-06-09 NOTE — Assessment & Plan Note (Signed)
 Atherosclerotic heart disease without angina symptoms.  Previous EKG stable.  Blood pressure borderline high, aggressive treatment not recommended to avoid balance issues. TIA considered but not confirmed.  Reintroducing aspirin  considered for cardiovascular protection, balancing fall risk. - Reintroduce aspirin  four days a week for cardiovascular protection. - Continue Repatha  for lipid management - Closely monitor blood pressure.  He has not required treatment before.  Low threshold to initiate therapy. - Continue recommend exercise

## 2024-06-17 ENCOUNTER — Other Ambulatory Visit (HOSPITAL_COMMUNITY): Payer: Self-pay

## 2024-06-17 ENCOUNTER — Telehealth (HOSPITAL_COMMUNITY): Payer: Self-pay

## 2024-06-17 NOTE — Telephone Encounter (Signed)
 PA request has been Received. New Encounter has been or will be created for follow up. For additional info see Pharmacy Prior Auth telephone encounter from 06/17/24.

## 2024-06-20 ENCOUNTER — Telehealth: Payer: Self-pay | Admitting: Pharmacy Technician

## 2024-06-20 ENCOUNTER — Other Ambulatory Visit (HOSPITAL_COMMUNITY): Payer: Self-pay

## 2024-06-20 ENCOUNTER — Other Ambulatory Visit (HOSPITAL_BASED_OUTPATIENT_CLINIC_OR_DEPARTMENT_OTHER): Payer: Self-pay

## 2024-06-20 NOTE — Telephone Encounter (Signed)
   Pharmacy Patient Advocate Encounter   Received notification from Pt Calls Messages that prior authorization for REPATHA  is required/requested.   Insurance verification completed.   The patient is insured through [facility].   Per test claim: PA required; PA submitted to above mentioned insurance via Latent Key/confirmation #/EOC [id number] Status is pending

## 2024-06-20 NOTE — Telephone Encounter (Signed)
 Pharmacy Patient Advocate Encounter  Received notification from [facility] that Prior Authorization for repatha  has been APPROVED from 06/20/24 to 06/20/25   PA #/Case ID/Reference #: [id number]

## 2024-06-20 NOTE — Telephone Encounter (Signed)
 PA request has been Submitted. New Encounter has been or will be created for follow up. For additional info see Pharmacy Prior Auth telephone encounter from 06/20/24.

## 2024-07-02 NOTE — Patient Instructions (Addendum)
      Blood work was ordered.       Medications changes include :   None    A referral was ordered and someone will call you to schedule an appointment.     Return in about 6 months (around 01/02/2025) for Physical Exam.

## 2024-07-02 NOTE — Progress Notes (Unsigned)
 Subjective:    Patient ID: Daniel Hampton, male    DOB: 09/04/1940, 83 y.o.   MRN: [id number]     HPI Daniel Hampton is here for follow up of his chronic medical problems.  Doing well. No concerns.    Still walking regularly for exercise.  Still always worried about his wife who has metastatic cancer not currently on treatment  Medications and allergies reviewed with patient and updated if appropriate.  Current Outpatient Medications on File Prior to Visit  Medication Sig Dispense Refill   aspirin  EC 81 MG tablet Take 81 mg po  four times a week .Swallow whole.     Coenzyme Q10 (COQ-10 PO) Take 1 tablet by mouth daily.     desonide  (DESOWEN ) 0.05 % cream Apply a small amount to affected area twice a day. Can use as needed on face. 15 g 2   erythromycin  ophthalmic ointment Apply to left eyelid and eyeball at night or as needed for discomfort 3.5 g 11   Evolocumab  (REPATHA  SURECLICK) 140 MG/ML SOAJ Inject 1 ML (140 MG) into the skin every 14 days 6 mL 3   famotidine  (PEPCID ) 20 MG tablet Take 20 mg by mouth 2 (two) times daily.     folic acid  (FOLVITE ) 1 MG tablet Take 1 tablet (1 mg total) by mouth daily. 90 tablet 3   methotrexate  (RHEUMATREX) 2.5 MG tablet Take 3 tablets (7.5 mg total) by mouth once a week. Take exactly as directed by prescriber. 36 tablet 3   Multiple Vitamins-Minerals (ALIVE MULTI-VITAMIN PO) Take 1 tablet by mouth daily.     Olopatadine HCl (PATADAY) 0.2 % SOLN 1 drop into affected eye Ophthalmic-OTC Once a day as needed; Duration: 30 days     valACYclovir  (VALTREX ) 1000 MG tablet Take 1 tablet (1,000 mg total) by mouth daily. 90 tablet 3   No current facility-administered medications on file prior to visit.     Review of Systems  Constitutional:  Negative for fever.  HENT:  Positive for sneezing.   Respiratory:  Positive for cough. Negative for shortness of breath and wheezing.   Cardiovascular:  Negative for chest pain, palpitations and leg swelling.   Neurological:  Positive for dizziness. Negative for light-headedness and headaches.       Balance is not great       Objective:   Vitals:   07/04/24 1310  BP: 128/80  Pulse: 72  Temp: 98.2 F (36.8 C)  SpO2: 99%   BP Readings from Last 3 Encounters:  07/04/24 128/80  06/06/24 138/78  04/27/24 128/70   Wt Readings from Last 3 Encounters:  07/04/24 234 lb (106.1 kg)  06/06/24 238 lb (108 kg)  04/27/24 237 lb (107.5 kg)   Body mass index is 33.58 kg/m.    Physical Exam Constitutional:      General: He is not in acute distress.    Appearance: Normal appearance. He is not ill-appearing.  HENT:     Head: Normocephalic and atraumatic.  Eyes:     Conjunctiva/sclera: Conjunctivae normal.  Cardiovascular:     Rate and Rhythm: Normal rate and regular rhythm.     Heart sounds: Normal heart sounds.  Pulmonary:     Effort: Pulmonary effort is normal. No respiratory distress.     Breath sounds: Normal breath sounds. No wheezing or rales.  Musculoskeletal:     Right lower leg: No edema.     Left lower leg: No edema.  Skin:  General: Skin is warm and dry.     Findings: No rash.  Neurological:     Mental Status: He is alert. Mental status is at baseline.  Psychiatric:        Mood and Affect: Mood normal.        Lab Results  Component Value Date   WBC 8.0 01/01/2024   HGB 15.8 01/01/2024   HCT 46.6 01/01/2024   PLT 216.0 01/01/2024   GLUCOSE 96 01/01/2024   CHOL 123 01/01/2024   TRIG 143.0 01/01/2024   HDL 52.10 01/01/2024   LDLDIRECT 88.0 02/03/2022   LDLCALC 42 01/01/2024   ALT 28 01/01/2024   AST 30 01/01/2024   NA 140 01/01/2024   K 4.4 01/01/2024   CL 103 01/01/2024   CREATININE 1.01 01/01/2024   BUN 16 01/01/2024   CO2 28 01/01/2024   TSH 2.80 01/18/2020   PSA 5.31 08/02/2018   HGBA1C 6.1 01/01/2024   MICROALBUR <0.7 01/01/2024     Assessment & Plan:    See Problem List for Assessment and Plan of chronic medical problems.

## 2024-07-04 ENCOUNTER — Ambulatory Visit (INDEPENDENT_AMBULATORY_CARE_PROVIDER_SITE_OTHER): Admitting: Internal Medicine

## 2024-07-04 VITALS — BP 128/80 | HR 72 | Temp 98.2°F | Ht 70.0 in | Wt 234.0 lb

## 2024-07-04 DIAGNOSIS — I2510 Atherosclerotic heart disease of native coronary artery without angina pectoris: Secondary | ICD-10-CM

## 2024-07-04 DIAGNOSIS — E785 Hyperlipidemia, unspecified: Secondary | ICD-10-CM | POA: Diagnosis not present

## 2024-07-04 DIAGNOSIS — E1169 Type 2 diabetes mellitus with other specified complication: Secondary | ICD-10-CM | POA: Diagnosis not present

## 2024-07-04 LAB — CBC WITH DIFFERENTIAL/PLATELET
Basophils Absolute: 0 K/uL (ref 0.0–0.1)
Basophils Relative: 0.4 % (ref 0.0–3.0)
Eosinophils Absolute: 0.1 K/uL (ref 0.0–0.7)
Eosinophils Relative: 1.7 % (ref 0.0–5.0)
HCT: 43.3 % (ref 39.0–52.0)
Hemoglobin: 14.8 g/dL (ref 13.0–17.0)
Lymphocytes Relative: 35.9 % (ref 12.0–46.0)
Lymphs Abs: 3.1 K/uL (ref 0.7–4.0)
MCHC: 34.2 g/dL (ref 30.0–36.0)
MCV: 94.8 fl (ref 78.0–100.0)
Monocytes Absolute: 0.5 K/uL (ref 0.1–1.0)
Monocytes Relative: 5.5 % (ref 3.0–12.0)
Neutro Abs: 4.9 K/uL (ref 1.4–7.7)
Neutrophils Relative %: 56.5 % (ref 43.0–77.0)
Platelets: 223 K/uL (ref 150.0–400.0)
RBC: 4.57 Mil/uL (ref 4.22–5.81)
RDW: 13.2 % (ref 11.5–15.5)
WBC: 8.7 K/uL (ref 4.0–10.5)

## 2024-07-04 LAB — COMPREHENSIVE METABOLIC PANEL WITH GFR
ALT: 34 U/L (ref 0–53)
AST: 28 U/L (ref 0–37)
Albumin: 4.5 g/dL (ref 3.5–5.2)
Alkaline Phosphatase: 53 U/L (ref 39–117)
BUN: 15 mg/dL (ref 6–23)
CO2: 31 meq/L (ref 19–32)
Calcium: 9.3 mg/dL (ref 8.4–10.5)
Chloride: 103 meq/L (ref 96–112)
Creatinine, Ser: 1.02 mg/dL (ref 0.40–1.50)
GFR: 67.5 mL/min (ref >60.00)
Glucose, Bld: 88 mg/dL (ref 70–99)
Potassium: 4.1 meq/L (ref 3.5–5.1)
Sodium: 140 meq/L (ref 135–145)
Total Bilirubin: 0.7 mg/dL (ref 0.2–1.2)
Total Protein: 7.3 g/dL (ref 6.0–8.3)

## 2024-07-04 LAB — LIPID PANEL
Cholesterol: 122 mg/dL (ref 0–200)
HDL: 44.7 mg/dL (ref >39.00)
LDL Cholesterol: 46 mg/dL (ref 0–99)
NonHDL: 77.01
Total CHOL/HDL Ratio: 3
Triglycerides: 154 mg/dL — ABNORMAL HIGH (ref 0.0–149.0)
VLDL: 30.8 mg/dL (ref 0.0–40.0)

## 2024-07-04 LAB — HEMOGLOBIN A1C: Hgb A1c MFr Bld: 5.9 % (ref 4.6–6.5)

## 2024-07-04 NOTE — Assessment & Plan Note (Signed)
 Chronic Check lipid panel, cmp Continue Repatha  140 mg q. 14 days regular walking and healthy diet encouraged

## 2024-07-04 NOTE — Assessment & Plan Note (Signed)
 Chronic Associated with hyperlipidemia, CAD Lab Results  Component Value Date   HGBA1C 6.1 01/01/2024   Check A1c Sugars controlled Currently on lifestyle control Stressed regular exercise, diabetic diet

## 2024-07-04 NOTE — Assessment & Plan Note (Signed)
 Chronic Following with Dr. Anner On Repatha  140 mg Q 14 days Continue regular walking Check lipids, cmp

## 2024-07-09 ENCOUNTER — Ambulatory Visit: Payer: Self-pay | Admitting: Internal Medicine

## 2024-08-08 ENCOUNTER — Ambulatory Visit (HOSPITAL_BASED_OUTPATIENT_CLINIC_OR_DEPARTMENT_OTHER)
Admission: RE | Admit: 2024-08-08 | Discharge: 2024-08-08 | Disposition: A | Discharge status: Home / Self Care | Source: Ambulatory Visit | Attending: Internal Medicine | Admitting: Internal Medicine

## 2024-08-08 DIAGNOSIS — J439 Emphysema, unspecified: Secondary | ICD-10-CM | POA: Insufficient documentation

## 2024-08-08 DIAGNOSIS — Z87891 Personal history of nicotine dependence: Secondary | ICD-10-CM | POA: Diagnosis present

## 2024-08-08 DIAGNOSIS — R918 Other nonspecific abnormal finding of lung field: Secondary | ICD-10-CM | POA: Insufficient documentation

## 2024-08-09 ENCOUNTER — Other Ambulatory Visit (HOSPITAL_COMMUNITY): Payer: Self-pay

## 2024-08-09 MED ORDER — FOLIC ACID 1 MG PO TABS
1.0000 mg | ORAL_TABLET | Freq: Every day | ORAL | 3 refills | Status: AC
  Filled 2024-08-09: qty 90, 90d supply, fill #0

## 2024-08-09 MED ORDER — VALACYCLOVIR HCL 1 G PO TABS
1000.0000 mg | ORAL_TABLET | Freq: Every day | ORAL | 3 refills | Status: AC
  Filled 2024-08-09: qty 90, 90d supply, fill #0

## 2024-08-09 MED ORDER — METHOTREXATE SODIUM 2.5 MG PO TABS
7.5000 mg | ORAL_TABLET | ORAL | 3 refills | Status: AC
  Filled 2024-08-09: qty 36, 84d supply, fill #0

## 2024-08-10 ENCOUNTER — Other Ambulatory Visit: Payer: Self-pay

## 2024-08-19 ENCOUNTER — Ambulatory Visit: Payer: Self-pay | Admitting: Adult Health

## 2024-08-30 NOTE — Progress Notes (Signed)
 Spoke with the pt and scheduled appt for 09/22/24 with MR.

## 2024-08-30 NOTE — Progress Notes (Signed)
 Going to see you in February 2026.  Will have to discuss the CT scan result.  No rush but please to keep appointment.  Xxx Slight interval increase in size of the subpleural ground-glass pulmonary nodule within the right middle lobe, now measuring 9 mm, in keeping with a progressive lesion along the adenocarcinoma spectrum. No solid component identified.

## 2024-09-22 ENCOUNTER — Ambulatory Visit: Admitting: Internal Medicine

## 2025-01-03 ENCOUNTER — Encounter: Admitting: Internal Medicine

## 2025-01-09 ENCOUNTER — Ambulatory Visit
